# Patient Record
Sex: Male | Born: 1940 | Race: White | Hispanic: No | Marital: Married | Smoking: Former smoker
Health system: Southern US, Community
[De-identification: ages and names within clinical notes are randomized; demographics above are authoritative.]

## PROBLEM LIST (undated history)

## (undated) DIAGNOSIS — I10 Essential (primary) hypertension: Secondary | ICD-10-CM

## (undated) DIAGNOSIS — I4891 Unspecified atrial fibrillation: Secondary | ICD-10-CM

## (undated) DIAGNOSIS — E785 Hyperlipidemia, unspecified: Secondary | ICD-10-CM

## (undated) DIAGNOSIS — I2781 Cor pulmonale (chronic): Secondary | ICD-10-CM

## (undated) DIAGNOSIS — J449 Chronic obstructive pulmonary disease, unspecified: Secondary | ICD-10-CM

## (undated) DIAGNOSIS — E119 Type 2 diabetes mellitus without complications: Secondary | ICD-10-CM

## (undated) DIAGNOSIS — J9612 Chronic respiratory failure with hypercapnia: Secondary | ICD-10-CM

## (undated) DIAGNOSIS — I5032 Chronic diastolic (congestive) heart failure: Secondary | ICD-10-CM

## (undated) DIAGNOSIS — J9611 Chronic respiratory failure with hypoxia: Secondary | ICD-10-CM

## (undated) DIAGNOSIS — N189 Chronic kidney disease, unspecified: Secondary | ICD-10-CM

## (undated) DIAGNOSIS — K219 Gastro-esophageal reflux disease without esophagitis: Secondary | ICD-10-CM

## (undated) HISTORY — PX: COLON SURGERY: SHX602

## (undated) HISTORY — PX: ROTATOR CUFF REPAIR: SHX139

---

## 2014-03-11 ENCOUNTER — Emergency Department (HOSPITAL_COMMUNITY)
Admission: EM | Admit: 2014-03-11 | Discharge: 2014-03-11 | Disposition: A | Discharge status: Home / Self Care | Payer: Medicare HMO | Attending: Emergency Medicine | Admitting: Emergency Medicine

## 2014-03-11 ENCOUNTER — Encounter (HOSPITAL_COMMUNITY): Payer: Self-pay | Admitting: Emergency Medicine

## 2014-03-11 DIAGNOSIS — I10 Essential (primary) hypertension: Secondary | ICD-10-CM | POA: Diagnosis not present

## 2014-03-11 DIAGNOSIS — Z8719 Personal history of other diseases of the digestive system: Secondary | ICD-10-CM | POA: Diagnosis not present

## 2014-03-11 DIAGNOSIS — Z8639 Personal history of other endocrine, nutritional and metabolic disease: Secondary | ICD-10-CM | POA: Insufficient documentation

## 2014-03-11 DIAGNOSIS — M436 Torticollis: Secondary | ICD-10-CM

## 2014-03-11 DIAGNOSIS — Z87891 Personal history of nicotine dependence: Secondary | ICD-10-CM | POA: Diagnosis not present

## 2014-03-11 DIAGNOSIS — M542 Cervicalgia: Secondary | ICD-10-CM | POA: Diagnosis not present

## 2014-03-11 DIAGNOSIS — Z862 Personal history of diseases of the blood and blood-forming organs and certain disorders involving the immune mechanism: Secondary | ICD-10-CM | POA: Insufficient documentation

## 2014-03-11 DIAGNOSIS — E119 Type 2 diabetes mellitus without complications: Secondary | ICD-10-CM | POA: Diagnosis not present

## 2014-03-11 HISTORY — DX: Hyperlipidemia, unspecified: E78.5

## 2014-03-11 MED ORDER — DIAZEPAM 5 MG PO TABS
5.0000 mg | ORAL_TABLET | Freq: Once | ORAL | Status: AC
  Administered 2014-03-11: 5 mg via ORAL
  Filled 2014-03-11: qty 1

## 2014-03-11 MED ORDER — MORPHINE SULFATE 4 MG/ML IJ SOLN
6.0000 mg | Freq: Once | INTRAMUSCULAR | Status: AC
  Administered 2014-03-11: 6 mg via INTRAMUSCULAR
  Filled 2014-03-11: qty 2

## 2014-03-11 MED ORDER — DIAZEPAM 5 MG PO TABS: 5.0000 mg | ORAL_TABLET | Freq: Four times a day (QID) | ORAL | Status: DC | PRN

## 2014-03-11 MED ORDER — HYDROCODONE-ACETAMINOPHEN 5-325 MG PO TABS: 1.0000 | ORAL_TABLET | ORAL | Status: DC | PRN

## 2014-03-11 NOTE — ED Notes (Signed)
Patient c/o neck pain and stiffness that started three days ago and gradually gotten worse. Denies injury or heavy lifting. States pain started on right trapezius muscle and has moved up neck and towards left side as well. Ambulatory with steady gait.

## 2014-03-11 NOTE — ED Provider Notes (Signed)
CSN: [id number]     Arrival date & time 03/11/14  9147 History  This chart was scribed for Enid Skeens, MD by Gwenyth Ober, ED Scribe. This patient was seen in room APA15/APA15 and the patient's care was started at 10:35 AM.    Chief Complaint  Patient presents with  . Torticollis   The history is provided by the patient and the spouse. No language interpreter was used.   HPI Comments: Emmerich Cryer is a 73 y.o. male who presents to the Emergency Department complaining of worsening right-sided neck pain and stiffness that started 3 days ago and travelled this morning to whole neck. Pt states that he changed his pillow with no relief to his symptoms. Pt  has limited ROM in neck and cannot look up, down or side-side. Pt has no Hx of prior symptoms. Pt denies changes in BM and urination, fever, constipation, CP, abdominal pain, numbness or weaknesses in his arm and legs, HA, and problems urinating. Pt has no past neck surgery. Pt has history of DM, HLD, HTN. Pt has NKDA.  Past Medical History  Diagnosis Date  . Hypertension   . Diabetes mellitus without complication   . Hyperlipidemia   . Acid reflux    Past Surgical History  Procedure Laterality Date  . Rotator cuff repair    . Colon surgery     No family history on file. History  Substance Use Topics  . Smoking status: Former Games developer  . Smokeless tobacco: Not on file  . Alcohol Use: No    Review of Systems  Constitutional: Negative for fever.  Respiratory: Negative for shortness of breath.   Cardiovascular: Negative for chest pain.  Gastrointestinal: Negative for abdominal pain and constipation.  Genitourinary: Negative for difficulty urinating.  Musculoskeletal: Positive for neck pain and neck stiffness.  Neurological: Negative for weakness, numbness and headaches.  All other systems reviewed and are negative.     Allergies  Review of patient's allergies indicates no known allergies.  Home Medications   Prior to  Admission medications   Not on File   BP 149/66  Pulse 59  Temp(Src) 97.9 F (36.6 C) (Oral)  Resp 16  Ht  (1.753 m)  Wt 200 lb (90.719 kg)  BMI 29.52 kg/m2  SpO2 100% Physical Exam  Constitutional: He appears well-developed and well-nourished. No distress.  HENT:  Head: Normocephalic and atraumatic.  Eyes: Conjunctivae and EOM are normal. Pupils are equal, round, and reactive to light.  Gross visual fields intact  Neck: Neck supple. No tracheal deviation present.  Cardiovascular: Normal rate.   Pulmonary/Chest: Effort normal. No respiratory distress.  Musculoskeletal:  Paraspinal cervical muscles are tight, tender (worse on left) No sign of midline tenderness (pain in muscles) Pain with both horizontal ROM Decreased ROM on both sides    Neurological: No cranial nerve deficit.  Sensation intact in major muscle groups 5+ strength of major muscle groups of UE Good strength of LE Wrist extension, flexion Finger extension, flexion    Skin: Skin is warm and dry.  Psychiatric: He has a normal mood and affect. His behavior is normal.    ED Course  Procedures (including critical care time) DIAGNOSTIC STUDIES: Oxygen Saturation is 100% on RA, normal by my interpretation.    COORDINATION OF CARE: 10:40 AM- Will order pain medication and muscle relaxers. Advised to use heating pad for symptoms and to follow up in 48 hours. Pt agreed to plan.   Labs Review Labs Reviewed - No  data to display  Imaging Review No results found.   EKG Interpretation None      MDM   Final diagnoses:  Torticollis  Neck pain   I personally performed the services described in this documentation, which was scribed in my presence. The recorded information has been reviewed and is accurate.  Clinically musculoskeletal/torticollis. No signs of meningitis. Normal neuro exam. Patient improved in ER with medicines.  Results and differential diagnosis were discussed with the  patient/parent/guardian. Close follow up outpatient was discussed, comfortable with the plan.   Medications  diazepam (VALIUM) tablet 5 mg (5 mg Oral Given 03/11/14 1114)  morphine 4 MG/ML injection 6 mg (6 mg Intramuscular Given 03/11/14 1114)    Filed Vitals:   03/11/14 1018 03/11/14 1130  BP: 149/66 133/64  Pulse: 59 65  Temp: 97.9 F (36.6 C)   TempSrc: Oral   Resp: 16 16  Height:  (1.753 m)   Weight: 200 lb (90.719 kg)   SpO2: 100% 100%    Final diagnoses:  Torticollis  Neck pain       Enid Skeens, MD 03/16/14 [phone]

## 2014-03-11 NOTE — Discharge Instructions (Signed)
If you were given medicines take as directed.  If you are on coumadin or contraceptives realize their levels and effectiveness is altered by many different medicines.  If you have any reaction (rash, tongues swelling, other) to the medicines stop taking and see a physician.   Use heat and gradually increase range of motion.  Please follow up as directed and return to the ER or see a physician for new or worsening symptoms.  Thank you. Filed Vitals:   03/11/14 1018  BP: 149/66  Pulse: 59  Temp: 97.9 F (36.6 C)  TempSrc: Oral  Resp: 16  Height:  (1.753 m)  Weight: 200 lb (90.719 kg)  SpO2: 100%    Cervical Sprain A cervical sprain is an injury in the neck in which the strong, fibrous tissues (ligaments) that connect your neck bones stretch or tear. Cervical sprains can range from mild to severe. Severe cervical sprains can cause the neck vertebrae to be unstable. This can lead to damage of the spinal cord and can result in serious nervous system problems. The amount of time it takes for a cervical sprain to get better depends on the cause and extent of the injury. Most cervical sprains heal in 1 to 3 weeks. CAUSES  Severe cervical sprains may be caused by:   Contact sport injuries (such as from football, rugby, wrestling, hockey, auto racing, gymnastics, diving, martial arts, or boxing).   Motor vehicle collisions.   Whiplash injuries. This is an injury from a sudden forward and backward whipping movement of the head and neck.  Falls.  Mild cervical sprains may be caused by:   Being in an awkward position, such as while cradling a telephone between your ear and shoulder.   Sitting in a chair that does not offer proper support.   Working at a poorly Marketing executive station.   Looking up or down for long periods of time.  SYMPTOMS   Pain, soreness, stiffness, or a burning sensation in the front, back, or sides of the neck. This discomfort may develop immediately  after the injury or slowly, 24 hours or more after the injury.   Pain or tenderness directly in the middle of the back of the neck.   Shoulder or upper back pain.   Limited ability to move the neck.   Headache.   Dizziness.   Weakness, numbness, or tingling in the hands or arms.   Muscle spasms.   Difficulty swallowing or chewing.   Tenderness and swelling of the neck.  DIAGNOSIS  Most of the time your health care provider can diagnose a cervical sprain by taking your history and doing a physical exam. Your health care provider will ask about previous neck injuries and any known neck problems, such as arthritis in the neck. X-rays may be taken to find out if there are any other problems, such as with the bones of the neck. Other tests, such as a CT scan or MRI, may also be needed.  TREATMENT  Treatment depends on the severity of the cervical sprain. Mild sprains can be treated with rest, keeping the neck in place (immobilization), and pain medicines. Severe cervical sprains are immediately immobilized. Further treatment is done to help with pain, muscle spasms, and other symptoms and may include:  Medicines, such as pain relievers, numbing medicines, or muscle relaxants.   Physical therapy. This may involve stretching exercises, strengthening exercises, and posture training. Exercises and improved posture can help stabilize the neck, strengthen muscles, and help  stop symptoms from returning.  HOME CARE INSTRUCTIONS   Put ice on the injured area.   Put ice in a plastic bag.   Place a towel between your skin and the bag.   Leave the ice on for 15-20 minutes, 3-4 times a day.   If your injury was severe, you may have been given a cervical collar to wear. A cervical collar is a two-piece collar designed to keep your neck from moving while it heals.  Do not remove the collar unless instructed by your health care provider.  If you have long hair, keep it outside of  the collar.  Ask your health care provider before making any adjustments to your collar. Minor adjustments may be required over time to improve comfort and reduce pressure on your chin or on the back of your head.  Ifyou are allowed to remove the collar for cleaning or bathing, follow your health care provider's instructions on how to do so safely.  Keep your collar clean by wiping it with mild soap and water and drying it completely. If the collar you have been given includes removable pads, remove them every 1-2 days and hand wash them with soap and water. Allow them to air dry. They should be completely dry before you wear them in the collar.  If you are allowed to remove the collar for cleaning and bathing, wash and dry the skin of your neck. Check your skin for irritation or sores. If you see any, tell your health care provider.  Do not drive while wearing the collar.   Only take over-the-counter or prescription medicines for pain, discomfort, or fever as directed by your health care provider.   Keep all follow-up appointments as directed by your health care provider.   Keep all physical therapy appointments as directed by your health care provider.   Make any needed adjustments to your workstation to promote good posture.   Avoid positions and activities that make your symptoms worse.   Warm up and stretch before being active to help prevent problems.  SEEK MEDICAL CARE IF:   Your pain is not controlled with medicine.   You are unable to decrease your pain medicine over time as planned.   Your activity level is not improving as expected.  SEEK IMMEDIATE MEDICAL CARE IF:   You develop any bleeding.  You develop stomach upset.  You have signs of an allergic reaction to your medicine.   Your symptoms get worse.   You develop new, unexplained symptoms.   You have numbness, tingling, weakness, or paralysis in any part of your body.  MAKE SURE YOU:    Understand these instructions.  Will watch your condition.  Will get help right away if you are not doing well or get worse. Document Released: 04/05/2007 Document Revised: 06/13/2013 Document Reviewed: 12/14/2012 [facility] Patient Information 2015 [place], [place]. This information is not intended to replace advice given to you by your health care provider. Make sure you discuss any questions you have with your health care provider.

## 2018-02-06 ENCOUNTER — Other Ambulatory Visit: Payer: Self-pay

## 2018-02-06 ENCOUNTER — Inpatient Hospital Stay (HOSPITAL_COMMUNITY): Payer: Medicare HMO

## 2018-02-06 ENCOUNTER — Emergency Department (HOSPITAL_COMMUNITY): Payer: Medicare HMO

## 2018-02-06 ENCOUNTER — Encounter (HOSPITAL_COMMUNITY): Payer: Self-pay | Admitting: *Deleted

## 2018-02-06 ENCOUNTER — Inpatient Hospital Stay (HOSPITAL_COMMUNITY)
Admission: EM | Admit: 2018-02-06 | Discharge: 2018-02-08 | DRG: 189 | Disposition: A | Discharge status: Home / Self Care | Payer: Medicare HMO | Attending: Internal Medicine | Admitting: Internal Medicine

## 2018-02-06 DIAGNOSIS — N182 Chronic kidney disease, stage 2 (mild): Secondary | ICD-10-CM | POA: Diagnosis present

## 2018-02-06 DIAGNOSIS — E785 Hyperlipidemia, unspecified: Secondary | ICD-10-CM | POA: Diagnosis present

## 2018-02-06 DIAGNOSIS — I2781 Cor pulmonale (chronic): Secondary | ICD-10-CM | POA: Diagnosis present

## 2018-02-06 DIAGNOSIS — Z7984 Long term (current) use of oral hypoglycemic drugs: Secondary | ICD-10-CM

## 2018-02-06 DIAGNOSIS — Z87891 Personal history of nicotine dependence: Secondary | ICD-10-CM | POA: Diagnosis not present

## 2018-02-06 DIAGNOSIS — I340 Nonrheumatic mitral (valve) insufficiency: Secondary | ICD-10-CM | POA: Diagnosis not present

## 2018-02-06 DIAGNOSIS — R0602 Shortness of breath: Secondary | ICD-10-CM | POA: Diagnosis present

## 2018-02-06 DIAGNOSIS — R0989 Other specified symptoms and signs involving the circulatory and respiratory systems: Secondary | ICD-10-CM

## 2018-02-06 DIAGNOSIS — N183 Chronic kidney disease, stage 3 unspecified: Secondary | ICD-10-CM

## 2018-02-06 DIAGNOSIS — R590 Localized enlarged lymph nodes: Secondary | ICD-10-CM | POA: Diagnosis present

## 2018-02-06 DIAGNOSIS — J9622 Acute and chronic respiratory failure with hypercapnia: Secondary | ICD-10-CM | POA: Diagnosis not present

## 2018-02-06 DIAGNOSIS — I130 Hypertensive heart and chronic kidney disease with heart failure and stage 1 through stage 4 chronic kidney disease, or unspecified chronic kidney disease: Secondary | ICD-10-CM | POA: Diagnosis present

## 2018-02-06 DIAGNOSIS — J849 Interstitial pulmonary disease, unspecified: Secondary | ICD-10-CM | POA: Diagnosis present

## 2018-02-06 DIAGNOSIS — J441 Chronic obstructive pulmonary disease with (acute) exacerbation: Secondary | ICD-10-CM | POA: Diagnosis not present

## 2018-02-06 DIAGNOSIS — Z7982 Long term (current) use of aspirin: Secondary | ICD-10-CM | POA: Diagnosis not present

## 2018-02-06 DIAGNOSIS — I5031 Acute diastolic (congestive) heart failure: Secondary | ICD-10-CM | POA: Diagnosis present

## 2018-02-06 DIAGNOSIS — Z79899 Other long term (current) drug therapy: Secondary | ICD-10-CM

## 2018-02-06 DIAGNOSIS — J9621 Acute and chronic respiratory failure with hypoxia: Principal | ICD-10-CM | POA: Diagnosis present

## 2018-02-06 DIAGNOSIS — Z9981 Dependence on supplemental oxygen: Secondary | ICD-10-CM | POA: Diagnosis not present

## 2018-02-06 DIAGNOSIS — K219 Gastro-esophageal reflux disease without esophagitis: Secondary | ICD-10-CM | POA: Diagnosis present

## 2018-02-06 DIAGNOSIS — I10 Essential (primary) hypertension: Secondary | ICD-10-CM

## 2018-02-06 DIAGNOSIS — I509 Heart failure, unspecified: Secondary | ICD-10-CM | POA: Diagnosis not present

## 2018-02-06 DIAGNOSIS — E1122 Type 2 diabetes mellitus with diabetic chronic kidney disease: Secondary | ICD-10-CM | POA: Diagnosis present

## 2018-02-06 DIAGNOSIS — I4510 Unspecified right bundle-branch block: Secondary | ICD-10-CM | POA: Diagnosis present

## 2018-02-06 LAB — COMPREHENSIVE METABOLIC PANEL
ALT: 12 U/L (ref 0–44)
AST: 16 U/L (ref 15–41)
Albumin: 3.9 g/dL (ref 3.5–5.0)
Alkaline Phosphatase: 66 U/L (ref 38–126)
Anion gap: 9 (ref 5–15)
BUN: 25 mg/dL — ABNORMAL HIGH (ref 8–23)
CHLORIDE: 100 mmol/L (ref 98–111)
CO2: 29 mmol/L (ref 22–32)
Calcium: 9.7 mg/dL (ref 8.9–10.3)
Creatinine, Ser: 1.32 mg/dL — ABNORMAL HIGH (ref 0.61–1.24)
GFR, EST AFRICAN AMERICAN: 58 mL/min — AB (ref >60)
GFR, EST NON AFRICAN AMERICAN: 50 mL/min — AB (ref >60)
Glucose, Bld: 180 mg/dL — ABNORMAL HIGH (ref 70–99)
POTASSIUM: 5 mmol/L (ref 3.5–5.1)
Sodium: 138 mmol/L (ref 135–145)
Total Bilirubin: 1.1 mg/dL (ref 0.3–1.2)
Total Protein: 8.5 g/dL — ABNORMAL HIGH (ref 6.5–8.1)

## 2018-02-06 LAB — BRAIN NATRIURETIC PEPTIDE: B Natriuretic Peptide: 329 pg/mL — ABNORMAL HIGH (ref 0.0–100.0)

## 2018-02-06 LAB — URINALYSIS, ROUTINE W REFLEX MICROSCOPIC
BILIRUBIN URINE: NEGATIVE
Glucose, UA: NEGATIVE mg/dL
Hgb urine dipstick: NEGATIVE
Ketones, ur: NEGATIVE mg/dL
Leukocytes, UA: NEGATIVE
NITRITE: NEGATIVE
PROTEIN: NEGATIVE mg/dL
SPECIFIC GRAVITY, URINE: 1.006 (ref 1.005–1.030)
pH: 7 (ref 5.0–8.0)

## 2018-02-06 LAB — TROPONIN I

## 2018-02-06 LAB — HEMOGLOBIN A1C
Hgb A1c MFr Bld: 6.6 % — ABNORMAL HIGH (ref 4.8–5.6)
MEAN PLASMA GLUCOSE: 142.72 mg/dL

## 2018-02-06 LAB — BLOOD GAS, ARTERIAL
ACID-BASE EXCESS: 1.7 mmol/L (ref 0.0–2.0)
Bicarbonate: 25.4 mmol/L (ref 20.0–28.0)
DRAWN BY: 419771
O2 CONTENT: 4 L/min
O2 Saturation: 98.8 %
PH ART: 7.346 — AB (ref 7.350–7.450)
pCO2 arterial: 50.3 mmHg — ABNORMAL HIGH (ref 32.0–48.0)
pO2, Arterial: 143 mmHg — ABNORMAL HIGH (ref 83.0–108.0)

## 2018-02-06 LAB — CBC WITH DIFFERENTIAL/PLATELET
BASOS ABS: 0 10*3/uL (ref 0.0–0.1)
Basophils Relative: 0 %
Eosinophils Absolute: 0.6 10*3/uL (ref 0.0–0.7)
Eosinophils Relative: 8 %
HCT: 38.3 % — ABNORMAL LOW (ref 39.0–52.0)
Hemoglobin: 11.6 g/dL — ABNORMAL LOW (ref 13.0–17.0)
LYMPHS ABS: 1.3 10*3/uL (ref 0.7–4.0)
LYMPHS PCT: 16 %
MCH: 28 pg (ref 26.0–34.0)
MCHC: 30.3 g/dL (ref 30.0–36.0)
MCV: 92.3 fL (ref 78.0–100.0)
Monocytes Absolute: 0.5 10*3/uL (ref 0.1–1.0)
Monocytes Relative: 7 %
NEUTROS PCT: 69 %
Neutro Abs: 5.5 10*3/uL (ref 1.7–7.7)
Platelets: 227 10*3/uL (ref 150–400)
RBC: 4.15 MIL/uL — AB (ref 4.22–5.81)
RDW: 14.9 % (ref 11.5–15.5)
WBC: 7.9 10*3/uL (ref 4.0–10.5)

## 2018-02-06 LAB — PROCALCITONIN: Procalcitonin: <0.1 ng/mL

## 2018-02-06 LAB — GLUCOSE, CAPILLARY
GLUCOSE-CAPILLARY: 300 mg/dL — AB (ref 70–99)
Glucose-Capillary: 151 mg/dL — ABNORMAL HIGH (ref 70–99)

## 2018-02-06 MED ORDER — SODIUM CHLORIDE 0.9% FLUSH: 3.0000 mL | INTRAVENOUS | Status: DC | PRN

## 2018-02-06 MED ORDER — IPRATROPIUM-ALBUTEROL 0.5-2.5 (3) MG/3ML IN SOLN: 3.0000 mL | Freq: Four times a day (QID) | RESPIRATORY_TRACT | Status: DC

## 2018-02-06 MED ORDER — IPRATROPIUM-ALBUTEROL 0.5-2.5 (3) MG/3ML IN SOLN
3.0000 mL | Freq: Four times a day (QID) | RESPIRATORY_TRACT | Status: DC
Start: 2018-02-06 — End: 2018-02-08
  Administered 2018-02-06 – 2018-02-08 (×5): 3 mL via RESPIRATORY_TRACT
  Filled 2018-02-06 (×6): qty 3

## 2018-02-06 MED ORDER — ENOXAPARIN SODIUM 40 MG/0.4ML ~~LOC~~ SOLN
40.0000 mg | SUBCUTANEOUS | Status: DC
  Administered 2018-02-06 – 2018-02-07 (×2): 40 mg via SUBCUTANEOUS
  Filled 2018-02-06 (×2): qty 0.4

## 2018-02-06 MED ORDER — SODIUM CHLORIDE 0.9 % IV SOLN: 250.0000 mL | INTRAVENOUS | Status: DC | PRN

## 2018-02-06 MED ORDER — PANTOPRAZOLE SODIUM 40 MG PO TBEC
40.0000 mg | DELAYED_RELEASE_TABLET | Freq: Every day | ORAL | Status: DC
  Administered 2018-02-07 – 2018-02-08 (×2): 40 mg via ORAL
  Filled 2018-02-06 (×2): qty 1

## 2018-02-06 MED ORDER — DOCUSATE SODIUM 100 MG PO CAPS
100.0000 mg | ORAL_CAPSULE | Freq: Every day | ORAL | Status: DC | PRN
  Administered 2018-02-06 – 2018-02-07 (×2): 100 mg via ORAL
  Filled 2018-02-06 (×2): qty 1

## 2018-02-06 MED ORDER — ONDANSETRON HCL 4 MG/2ML IJ SOLN: 4.0000 mg | Freq: Four times a day (QID) | INTRAMUSCULAR | Status: DC | PRN

## 2018-02-06 MED ORDER — ATORVASTATIN CALCIUM 20 MG PO TABS: 20.0000 mg | ORAL_TABLET | Freq: Every day | ORAL | Status: DC

## 2018-02-06 MED ORDER — FUROSEMIDE 10 MG/ML IJ SOLN: 40.0000 mg | Freq: Every day | INTRAMUSCULAR | Status: DC

## 2018-02-06 MED ORDER — FUROSEMIDE 10 MG/ML IJ SOLN
40.0000 mg | Freq: Once | INTRAMUSCULAR | Status: AC
  Administered 2018-02-06: 40 mg via INTRAVENOUS
  Filled 2018-02-06: qty 4

## 2018-02-06 MED ORDER — METHYLPREDNISOLONE SODIUM SUCC 125 MG IJ SOLR
60.0000 mg | Freq: Four times a day (QID) | INTRAMUSCULAR | Status: DC
  Administered 2018-02-06 – 2018-02-07 (×2): 60 mg via INTRAVENOUS
  Filled 2018-02-06 (×2): qty 2

## 2018-02-06 MED ORDER — INSULIN ASPART 100 UNIT/ML ~~LOC~~ SOLN
0.0000 [IU] | Freq: Three times a day (TID) | SUBCUTANEOUS | Status: DC
  Administered 2018-02-06: 2 [IU] via SUBCUTANEOUS
  Administered 2018-02-07: 9 [IU] via SUBCUTANEOUS
  Administered 2018-02-07: 3 [IU] via SUBCUTANEOUS

## 2018-02-06 MED ORDER — BUDESONIDE 0.5 MG/2ML IN SUSP
0.5000 mg | Freq: Two times a day (BID) | RESPIRATORY_TRACT | Status: DC
  Administered 2018-02-06 – 2018-02-08 (×4): 0.5 mg via RESPIRATORY_TRACT
  Filled 2018-02-06 (×4): qty 2

## 2018-02-06 MED ORDER — ASPIRIN EC 81 MG PO TBEC
81.0000 mg | DELAYED_RELEASE_TABLET | Freq: Every day | ORAL | Status: DC
  Administered 2018-02-07 – 2018-02-08 (×2): 81 mg via ORAL
  Filled 2018-02-06 (×2): qty 1

## 2018-02-06 MED ORDER — METHYLPREDNISOLONE SODIUM SUCC 125 MG IJ SOLR
125.0000 mg | Freq: Once | INTRAMUSCULAR | Status: AC
  Administered 2018-02-06: 125 mg via INTRAVENOUS
  Filled 2018-02-06: qty 2

## 2018-02-06 MED ORDER — INSULIN ASPART 100 UNIT/ML ~~LOC~~ SOLN
0.0000 [IU] | Freq: Every day | SUBCUTANEOUS | Status: DC
  Administered 2018-02-06: 3 [IU] via SUBCUTANEOUS

## 2018-02-06 MED ORDER — ALBUTEROL (5 MG/ML) CONTINUOUS INHALATION SOLN
10.0000 mg/h | INHALATION_SOLUTION | Freq: Once | RESPIRATORY_TRACT | Status: AC
  Administered 2018-02-06: 10 mg/h via RESPIRATORY_TRACT
  Filled 2018-02-06: qty 20

## 2018-02-06 MED ORDER — ACETAMINOPHEN 325 MG PO TABS: 650.0000 mg | ORAL_TABLET | ORAL | Status: DC | PRN

## 2018-02-06 MED ORDER — CARVEDILOL 12.5 MG PO TABS
12.5000 mg | ORAL_TABLET | Freq: Two times a day (BID) | ORAL | Status: DC
  Administered 2018-02-06: 12.5 mg via ORAL
  Filled 2018-02-06: qty 1

## 2018-02-06 MED ORDER — ZOLPIDEM TARTRATE 5 MG PO TABS
5.0000 mg | ORAL_TABLET | Freq: Once | ORAL | Status: AC
  Administered 2018-02-06: 5 mg via ORAL
  Filled 2018-02-06: qty 1

## 2018-02-06 MED ORDER — SODIUM CHLORIDE 0.9% FLUSH
3.0000 mL | Freq: Two times a day (BID) | INTRAVENOUS | Status: DC
  Administered 2018-02-06 – 2018-02-08 (×5): 3 mL via INTRAVENOUS

## 2018-02-06 NOTE — H&P (Signed)
History and Physical  Mario LovingsJames R Sangiovanni ZOX:[id number] DOB: 04/04/1941 DOA: 02/06/2018   PCP: [name], [name], MD   Patient coming from: Home  Chief Complaint: dyspnea  HPI:  Mario Walker is a 77 y.o. male with medical history of tobacco abuse, diabetes mellitus, hypertension, and hyperlipidemia presenting with 2 to 5477-month history of worsening shortness of breath that has significantly worsened over the past 1 week.  Patient complains primarily of dyspnea on exertion, but he denies any fevers, chills, chest pain, nausea, vomiting, diarrhea, abdominal pain, coughing, hemoptysis.  He states that he is having shortness of breath even walking to his bathroom from his bedroom.  The patient has noted some increase in lower extremity edema, but states that it has been unchanged in the past 1 to 2 weeks.  The patient was placed on furosemide approximately 2 months prior to this admission.  [facility] approximately 1 month prior to this admission for CHF.  He was discharged home with furosemide 40 mg daily.  Patient denies any orthopnea or PND type symptoms.  However he does complain of fluctuating lower extremity edema and some mild increase in his abdominal girth.  He denies any recent travels or sick contacts.  The patient has a remote history of tobacco with a total of approximately 30 pack years.  He quit smoking 50 years ago.  He denies any abdominal pain, dysuria, hematuria, rashes, synovitis.  The patient used to work in the Education officer, environmentalauto industry retreading brakes.  Patient states that he normally he is on 2 L nasal cannula at home, but in the past 1 to 2 weeks he has had increase it to 4 L. In the emergency department, the patient desaturated to 69% on his usual 4 L.  The patient is afebrile hemodynamically stable saturating 96-100% on 4 L.  CBC was essentially unremarkable.  BMP showed serum creatinine 1.32.  LFTs were unremarkable.  BNP was 329.  Initial troponin was  negative.  Chest x-ray showed mild bilateral perihilar opacification suggesting a vascular congestion.  ABG revealed 7.34/50/143/25 on 4 L.  The patient was given Solu-Medrol and furosemide in the emergency department with some improvement in his respiratory status.  Assessment/Plan: Acute on chronic respiratory failure -Patient is normally on 2 L baseline -Etiology unclear, but suspect this is multifactorial including CHF, underlying COPD and possible underlying interstitial lung disease -Wean oxygen as tolerated back to 2 L for saturation greater 92% -Pulmonary hygiene -check ANA -check ANCA  COPD exacerbation -Start Pulmicort -Start IV Solu-Medrol -Start duo nebs -Will ultimately need PFTs as an outpatient  Acute CHF, type unspecified -start Lasix 40 mg IV daily -Echocardiogram -Daily weights -Accurate I's and O's  Diabetes mellitus type 2 -NovoLog sliding scale -Hemoglobin A1c -Holding glimepiride and metformin  Essential hypertension -Continue carvedilol -Holding Cardura and HCTZ  CKD stage III -Baseline creatinine 0.9-1.2 -Presenting creatinine 1.32 -Monitor with diuresis  RBBB -no old EKGs to compare -Echo     Past Medical History:  Diagnosis Date  . Acid reflux   . Diabetes mellitus without complication (HCC)   . Hyperlipidemia   . Hypertension    Past Surgical History:  Procedure Laterality Date  . COLON SURGERY    . ROTATOR CUFF REPAIR     Social History:  reports that he has quit smoking. He has never used smokeless tobacco. He reports that he does not drink alcohol or use drugs.   Family history--reviewed. No pertinent family  hx  No Known Allergies   Prior to Admission medications   Medication Sig Start Date End Date Taking? Authorizing Provider  amLODipine (NORVASC) 10 MG tablet Take 1 tablet by mouth daily. 03/05/14  Yes [provider]  Ascorbic Acid (VITAMIN C PO) Take 1 tablet by mouth daily.   Yes [provider]    aspirin EC 81 MG tablet Take 81 mg by mouth daily.   Yes [provider]  atorvastatin (LIPITOR) 20 MG tablet Take 1 tablet by mouth daily. 01/03/14  Yes [provider]  carvedilol (COREG) 25 MG tablet Take 1 tablet by mouth 2 (two) times daily. 01/19/14  Yes [provider]  Cholecalciferol (VITAMIN D PO) Take 1 tablet by mouth daily.   Yes [provider]  Cyanocobalamin (VITAMIN B-12 PO) Take 1 tablet by mouth daily.   Yes [provider]  doxazosin (CARDURA) 8 MG tablet Take 1 tablet by mouth daily. 01/19/14  Yes [provider]  glimepiride (AMARYL) 2 MG tablet Take 1 tablet by mouth daily. 03/05/14  Yes [provider]  hydrochlorothiazide (HYDRODIURIL) 25 MG tablet Take 1 tablet by mouth daily. 03/05/14  Yes [provider]  HYDROcodone-acetaminophen (NORCO) 5-325 MG per tablet Take 1-2 tablets by mouth every 4 (four) hours as needed. 03/11/14  Yes Blane Ohara, MD  metFORMIN (GLUCOPHAGE) 500 MG tablet Take 1 tablet by mouth daily. 02/09/14  Yes [provider]  multivitamin-iron-minerals-folic acid (CENTRUM) chewable tablet Chew 1 tablet by mouth daily.   Yes [provider]  Omega-3 Fatty Acids (FISH OIL PO) Take 1 capsule by mouth daily.   Yes [provider]  omeprazole (PRILOSEC) 20 MG capsule Take 1 capsule by mouth daily. 01/19/14  Yes [provider]  tetrahydrozoline (REDNESS RELIEVER EYE DROPS) 0.05 % ophthalmic solution Place 1 drop into both eyes daily.    [provider]  VITAMIN A PO Take 1 tablet by mouth daily.    [provider]    Review of Systems:  Constitutional:  No weight loss, night sweats, Fevers, chills, fatigue.  Head&Eyes: No headache.  No vision loss.  No eye pain or scotoma ENT:  No Difficulty swallowing,Tooth/dental problems,Sore throat,  No ear ache, post nasal drip,  Cardio-vascular:  No chest pain, Orthopnea, PND, swelling in  lower extremities,  dizziness, palpitations  GI:  No  abdominal pain, nausea, vomiting, diarrhea, loss of appetite, hematochezia, melena, heartburn, indigestion, Resp:   No cough. No coughing up of blood .No wheezing.No chest wall deformity  Skin:  no rash or lesions.  GU:  no dysuria, change in color of urine, no urgency or frequency. No flank pain.  Musculoskeletal:  No joint pain or swelling. No decreased range of motion. No back pain.  Psych:  No change in mood or affect. No depression or anxiety. Neurologic: No headache, no dysesthesia, no focal weakness, no vision loss. No syncope  Physical Exam: Vitals:   02/06/18 1200 02/06/18 1209 02/06/18 1230 02/06/18 1300  BP: (!) 155/72  (!) 145/73 (!) 149/73  Pulse: 73  66 66  Resp: 20  18 18   Temp:      TempSrc:      SpO2: 95% 98% 100% 100%  Weight:      Height:       General:  A&O x 3, NAD, nontoxic, pleasant/cooperative Head/Eye: No conjunctival hemorrhage, no icterus, [place]/AT, No nystagmus ENT:  No icterus,  No thrush, good dentition, no pharyngeal exudate Neck:  No masses,  no lymphadenpathy, no bruits CV:  RRR, no rub, no gallop, no S3 Lung: bibasilar rhonchi Abdomen: soft/NT, +BS, nondistended, no peritoneal signs Ext: No cyanosis, No rashes, No petechiae, No lymphangitis, 1 +LE edema Neuro: CNII-XII intact, strength 4/5 in bilateral upper and lower extremities, no dysmetria  Labs on Admission:  Basic Metabolic Panel: Recent Labs  Lab 02/06/18 1203  NA 138  K 5.0  CL 100  CO2 29  GLUCOSE 180*  BUN 25*  CREATININE 1.32*  CALCIUM 9.7   Liver Function Tests: Recent Labs  Lab 02/06/18 1203  AST 16  ALT 12  ALKPHOS 66  BILITOT 1.1  PROT 8.5*  ALBUMIN 3.9   No results for input(s): LIPASE, AMYLASE in the last 168 hours. No results for input(s): AMMONIA in the last 168 hours. CBC: Recent Labs  Lab 02/06/18 1203  WBC 7.9  NEUTROABS 5.5  HGB 11.6*  HCT 38.3*  MCV 92.3  PLT 227   Coagulation  Profile: No results for input(s): INR, PROTIME in the last 168 hours. Cardiac Enzymes: Recent Labs  Lab 02/06/18 1203  TROPONINI <0.03   BNP: Invalid input(s): POCBNP CBG: No results for input(s): GLUCAP in the last 168 hours. Urine analysis: No results found for: COLORURINE, APPEARANCEUR, LABSPEC, PHURINE, GLUCOSEU, HGBUR, BILIRUBINUR, KETONESUR, PROTEINUR, UROBILINOGEN, NITRITE, LEUKOCYTESUR Sepsis Labs: @LABRCNTIP (procalcitonin:4,lacticidven:4) ) Recent Results (from the past 240 hour(s))  Culture, blood (routine x 2)     Status: None (Preliminary result)   Collection Time: 02/06/18 12:35 PM  Result Value Ref Range Status   Specimen Description BLOOD LEFT HAND  Final   Special Requests   Final    BOTTLES DRAWN AEROBIC AND ANAEROBIC Blood Culture adequate volume Performed at [facility], [address] [place], [place] [address]    Culture PENDING  Incomplete   Report Status PENDING  Incomplete     Radiological Exams on Admission: Dg Chest Port 1 View  Result Date: 02/06/2018 CLINICAL DATA:  Shortness of breath worsening today. EXAM: PORTABLE CHEST 1 VIEW COMPARISON:  None. FINDINGS: Lungs are adequately inflated with mild hazy perihilar bibasilar opacification likely due to mild vascular congestion. Infection or atelectasis in the lung bases is possible. Moderate cardiomegaly. Degenerative change of the spine. IMPRESSION: Mild perihilar bibasilar opacification suggesting vascular congestion. Atelectasis or infection in the lung bases is possible. Moderate cardiomegaly. Electronically Signed   By: Elberta Fortisaniel  Boyle M.D.   On: 02/06/2018 13:07    EKG: Independently reviewed. Sinus RBBB    Time spent:60 minutes Code Status:   FULL Family Communication:  Son updated at bedside Disposition Plan: expect 2-3 day hospitalization Consults called: none DVT Prophylaxis: Post Lovenox  Catarina HartshornDavid Elayne Gruver, DO  [facility] Pager [phone]  If 7PM-7AM, please contact  night-coverage [email or web address] Password [email or web address] 02/06/2018, 2:10 PM

## 2018-02-06 NOTE — ED Triage Notes (Addendum)
Pt c/o worsening SOB x 2 months. Pt wears 2L of O2 via [place] normally and bumps it up to 4L when ambulating.  Pt reports he gets very worn out when ambulating. Denies cough, fever. Pt has been to see PCP and says he wasn't told anything except to follow up in 6 months.

## 2018-02-06 NOTE — ED Notes (Signed)
Call for report   Misty StanleyLisa, RN

## 2018-02-06 NOTE — ED Provider Notes (Signed)
[facility] EMERGENCY DEPARTMENT Provider Note   CSN: [id number] Arrival date & time: 02/06/18  1134     History   Chief Complaint Chief Complaint  Patient presents with  . Shortness of Breath    HPI Mario Walker is a 77 y.o. male.  Pt presents to the ED today with sob.  The pt is on oxygen at 2L normally.  He is unsure why, he has no formal diagnosis of COPD.  He said his doctor just told him he needed it.  For the past few days, he has been more sob.  He has increased his oxygen to 4L.  The pt gets very tired when he does ambulate.  He denies f/c.  No cough. No known dx of chf, but says he takes lasix daily (40 mg).  He did not take it yesterday, but did take it this morning.     Past Medical History:  Diagnosis Date  . Acid reflux   . Diabetes mellitus without complication (HCC)   . Hyperlipidemia   . Hypertension     There are no active problems to display for this patient.   Past Surgical History:  Procedure Laterality Date  . COLON SURGERY    . ROTATOR CUFF REPAIR          Home Medications    Prior to Admission medications   Medication Sig Start Date End Date Taking? Authorizing Provider  acetaminophen (TYLENOL) 500 MG tablet Take 1,000 mg by mouth daily as needed for moderate pain.    [provider]  amLODipine (NORVASC) 10 MG tablet Take 1 tablet by mouth daily. 03/05/14   [provider]  Ascorbic Acid (VITAMIN C PO) Take 1 tablet by mouth daily.    [provider]  aspirin EC 81 MG tablet Take 81 mg by mouth daily.    [provider]  atorvastatin (LIPITOR) 20 MG tablet Take 1 tablet by mouth daily. 01/03/14   [provider]  carvedilol (COREG) 25 MG tablet Take 1 tablet by mouth 2 (two) times daily. 01/19/14   [provider]  Cholecalciferol (VITAMIN D PO) Take 1 tablet by mouth daily.    [provider]  Cyanocobalamin (VITAMIN B-12 PO) Take 1 tablet by mouth daily.    [provider]  diazepam (VALIUM) 5 MG tablet Take 1 tablet (5 mg total) by mouth every 6 (six) hours as needed for anxiety (spasms). 03/11/14   Blane OharaZavitz, Joshua, MD  doxazosin (CARDURA) 8 MG tablet Take 1 tablet by mouth daily. 01/19/14   [provider]  glimepiride (AMARYL) 2 MG tablet Take 1 tablet by mouth daily. 03/05/14   [provider]  hydrochlorothiazide (HYDRODIURIL) 25 MG tablet Take 1 tablet by mouth daily. 03/05/14   [provider]  HYDROcodone-acetaminophen (NORCO) 5-325 MG per tablet Take 1-2 tablets by mouth every 4 (four) hours as needed. 03/11/14   Blane OharaZavitz, Joshua, MD  HYDROcodone-acetaminophen (NORCO/VICODIN) 5-325 MG per tablet Take 1 tablet by mouth daily as needed for moderate pain.  02/08/14   [provider]  metFORMIN (GLUCOPHAGE) 500 MG tablet Take 1 tablet by mouth daily. 02/09/14   [provider]  multivitamin-iron-minerals-folic acid (CENTRUM) chewable tablet Chew 1 tablet by mouth daily.    [provider]  Omega-3 Fatty Acids (FISH OIL PO) Take 1 capsule by mouth daily.    [provider]  omeprazole (PRILOSEC) 20 MG capsule Take 1 capsule by mouth daily. 01/19/14   [provider]  tetrahydrozoline (REDNESS RELIEVER EYE DROPS) 0.05 % ophthalmic solution Place 1 drop into both eyes daily.    [provider]  VITAMIN A PO Take 1 tablet by mouth daily.    [provider]    Family History No family history on file.  Social History Social History   Tobacco Use  . Smoking status: Former Games developermoker  . Smokeless tobacco: Never Used  Substance Use Topics  . Alcohol use: No  . Drug use: No     Allergies   Patient has no known allergies.   Review of Systems Review of Systems  Respiratory: Positive for shortness of breath.   All other systems reviewed and are negative.    Physical Exam Updated Vital Signs BP (!) 149/73   Pulse 66   Temp (!) 97.3 F (36.3 C) (Oral)   Resp  18   Ht 5\' 10"  (1.778 m)   Wt 90.7 kg   SpO2 100%   BMI 28.70 kg/m   Physical Exam  Constitutional: He is oriented to person, place, and time. He appears ill. He appears distressed.  HENT:  Head: Normocephalic and atraumatic.  Mouth/Throat: Oropharynx is clear and moist.  Eyes: Pupils are equal, round, and reactive to light. EOM are normal.  Neck: Normal range of motion. Neck supple.  Cardiovascular: Normal rate, regular rhythm, normal heart sounds and intact distal pulses.  Pulmonary/Chest: Accessory muscle usage present. Tachypnea noted. He is in respiratory distress. He has wheezes.  Abdominal: Soft. Bowel sounds are normal.  Musculoskeletal: Normal range of motion.       Right lower leg: Normal.       Left lower leg: Normal.  Neurological: He is alert and oriented to person, place, and time.  Skin: Skin is warm and dry. Capillary refill takes less than 2 seconds.  Psychiatric: He has a normal mood and affect. His behavior is normal.  Nursing note and vitals reviewed.    ED Treatments / Results  Labs (all labs ordered are listed, but only abnormal results are displayed) Labs Reviewed  COMPREHENSIVE METABOLIC PANEL - Abnormal; Notable for the following components:      Result Value   Glucose, Bld 180 (*)    BUN 25 (*)    Creatinine, Ser 1.32 (*)    Total Protein 8.5 (*)    GFR calc non Af Amer 50 (*)    GFR calc Af Amer 58 (*)    All other components within normal limits  CBC WITH DIFFERENTIAL/PLATELET - Abnormal; Notable for the following components:   RBC 4.15 (*)    Hemoglobin 11.6 (*)    HCT 38.3 (*)    All other components within normal limits  BRAIN NATRIURETIC PEPTIDE - Abnormal; Notable for the following components:   B Natriuretic Peptide 329.0 (*)    All other components within normal limits  BLOOD GAS, ARTERIAL - Abnormal; Notable for the following components:   pH, Arterial 7.346 (*)    pCO2 arterial 50.3 (*)    pO2, Arterial 143 (*)    All other  components within normal limits  CULTURE, BLOOD (ROUTINE X 2)  CULTURE, BLOOD (ROUTINE X 2)  TROPONIN I  URINALYSIS, ROUTINE W REFLEX MICROSCOPIC    EKG EKG Interpretation  Date/Time:  Sunday February 06 2018 12:00:48 EDT Ventricular Rate:  74 PR Interval:    QRS Duration: 152 QT Interval:  417 QTC Calculation: 463 R Axis:   -122 Text Interpretation:  Sinus rhythm Probable left atrial  enlargement Right bundle branch block Baseline wander in lead(s) V4 V5 No old tracing to compare Confirmed by Jacalyn Lefevre ([id number]) on 02/06/2018 12:44:56 PM   Radiology Dg Chest Port 1 View  Result Date: 02/06/2018 CLINICAL DATA:  Shortness of breath worsening today. EXAM: PORTABLE CHEST 1 VIEW COMPARISON:  None. FINDINGS: Lungs are adequately inflated with mild hazy perihilar bibasilar opacification likely due to mild vascular congestion. Infection or atelectasis in the lung bases is possible. Moderate cardiomegaly. Degenerative change of the spine. IMPRESSION: Mild perihilar bibasilar opacification suggesting vascular congestion. Atelectasis or infection in the lung bases is possible. Moderate cardiomegaly. Electronically Signed   By: Elberta Fortis M.D.   On: 02/06/2018 13:07    Procedures Procedures (including critical care time)  Medications Ordered in ED Medications  albuterol (PROVENTIL,VENTOLIN) solution continuous neb (10 mg/hr Nebulization Given 02/06/18 1209)  methylPREDNISolone sodium succinate (SOLU-MEDROL) 125 mg/2 mL injection 125 mg (125 mg Intravenous Given 02/06/18 1315)  furosemide (LASIX) injection 40 mg (40 mg Intravenous Given 02/06/18 1320)     Initial Impression / Assessment and Plan / ED Course  I have reviewed the triage vital signs and the nursing notes.  Pertinent labs & imaging results that were available during my care of the patient were reviewed by me and considered in my medical decision making (see chart for details).    On 2L, pt's O2 sat dropped to 69% just  moving from wheelchair to the bed.  His lips became cyanotic and he looked more distressed.  Turning oxygen up to 4L and having pt rest and take deep breaths through his nose brought oxygen back up.  Continuous neb has helped him feel better and he is now pinker.  The pt will also be given a dose of lasix here as well.  Resp status has improved and he looks more comfortable on 4L, but he still not back to baseline.  Pt d/w Dr. Arbutus Leas ([facility]) for admission.  CRITICAL CARE Performed by: Jacalyn Lefevre   Total critical care time: 30 minutes  Critical care time was exclusive of separately billable procedures and treating other patients.  Critical care was necessary to treat or prevent imminent or life-threatening deterioration.  Critical care was time spent personally by me on the following activities: development of treatment plan with patient and/or surrogate as well as nursing, discussions with consultants, evaluation of patient's response to treatment, examination of patient, obtaining history from patient or surrogate, ordering and performing treatments and interventions, ordering and review of laboratory studies, ordering and review of radiographic studies, pulse oximetry and re-evaluation of patient's condition.  Final Clinical Impressions(s) / ED Diagnoses   Final diagnoses:  Acute on chronic respiratory failure with hypoxia (HCC)  COPD exacerbation [facility])  Pulmonary vascular congestion    ED Discharge Orders    None       Jacalyn Lefevre, MD 02/06/18 1335

## 2018-02-07 ENCOUNTER — Inpatient Hospital Stay (HOSPITAL_COMMUNITY): Payer: Medicare HMO

## 2018-02-07 DIAGNOSIS — I340 Nonrheumatic mitral (valve) insufficiency: Secondary | ICD-10-CM

## 2018-02-07 LAB — GLUCOSE, CAPILLARY
GLUCOSE-CAPILLARY: 403 mg/dL — AB (ref 70–99)
GLUCOSE-CAPILLARY: 353 mg/dL — AB (ref 70–99)
Glucose-Capillary: 227 mg/dL — ABNORMAL HIGH (ref 70–99)
Glucose-Capillary: 342 mg/dL — ABNORMAL HIGH (ref 70–99)

## 2018-02-07 LAB — RESPIRATORY PANEL BY PCR
ADENOVIRUS-RVPPCR: NOT DETECTED
Bordetella pertussis: NOT DETECTED
CORONAVIRUS NL63-RVPPCR: NOT DETECTED
CORONAVIRUS OC43-RVPPCR: NOT DETECTED
Chlamydophila pneumoniae: NOT DETECTED
Coronavirus 229E: NOT DETECTED
Coronavirus HKU1: NOT DETECTED
INFLUENZA A-RVPPCR: NOT DETECTED
Influenza B: NOT DETECTED
METAPNEUMOVIRUS-RVPPCR: NOT DETECTED
Mycoplasma pneumoniae: NOT DETECTED
PARAINFLUENZA VIRUS 2-RVPPCR: NOT DETECTED
PARAINFLUENZA VIRUS 3-RVPPCR: NOT DETECTED
PARAINFLUENZA VIRUS 4-RVPPCR: NOT DETECTED
Parainfluenza Virus 1: NOT DETECTED
Respiratory Syncytial Virus: NOT DETECTED
Rhinovirus / Enterovirus: NOT DETECTED

## 2018-02-07 LAB — BASIC METABOLIC PANEL
ANION GAP: 10 (ref 5–15)
BUN: 27 mg/dL — ABNORMAL HIGH (ref 8–23)
CALCIUM: 9.4 mg/dL (ref 8.9–10.3)
CO2: 30 mmol/L (ref 22–32)
CREATININE: 1.16 mg/dL (ref 0.61–1.24)
Chloride: 96 mmol/L — ABNORMAL LOW (ref 98–111)
GFR calc Af Amer: >60 mL/min (ref >60)
GFR calc non Af Amer: 58 mL/min — ABNORMAL LOW (ref >60)
GLUCOSE: 226 mg/dL — AB (ref 70–99)
Potassium: 4.1 mmol/L (ref 3.5–5.1)
Sodium: 136 mmol/L (ref 135–145)

## 2018-02-07 LAB — ECHOCARDIOGRAM COMPLETE
Height: 70 in
Weight: 3188.73 oz

## 2018-02-07 MED ORDER — ATORVASTATIN CALCIUM 40 MG PO TABS
40.0000 mg | ORAL_TABLET | Freq: Every day | ORAL | Status: DC
  Administered 2018-02-07 – 2018-02-08 (×2): 40 mg via ORAL
  Filled 2018-02-07 (×2): qty 1

## 2018-02-07 MED ORDER — ZOLPIDEM TARTRATE 5 MG PO TABS
5.0000 mg | ORAL_TABLET | Freq: Every evening | ORAL | Status: DC | PRN
  Administered 2018-02-07: 5 mg via ORAL
  Filled 2018-02-07: qty 1

## 2018-02-07 MED ORDER — ORAL CARE MOUTH RINSE
15.0000 mL | Freq: Two times a day (BID) | OROMUCOSAL | Status: DC
  Administered 2018-02-08: 15 mL via OROMUCOSAL

## 2018-02-07 MED ORDER — METHYLPREDNISOLONE SODIUM SUCC 125 MG IJ SOLR
60.0000 mg | Freq: Four times a day (QID) | INTRAMUSCULAR | Status: AC
  Administered 2018-02-07 (×3): 60 mg via INTRAVENOUS
  Filled 2018-02-07 (×3): qty 2

## 2018-02-07 MED ORDER — CARVEDILOL 12.5 MG PO TABS
25.0000 mg | ORAL_TABLET | Freq: Two times a day (BID) | ORAL | Status: DC
  Administered 2018-02-07 – 2018-02-08 (×3): 25 mg via ORAL
  Filled 2018-02-07 (×3): qty 2

## 2018-02-07 MED ORDER — FUROSEMIDE 10 MG/ML IJ SOLN
40.0000 mg | Freq: Two times a day (BID) | INTRAMUSCULAR | Status: AC
  Administered 2018-02-07 (×2): 40 mg via INTRAVENOUS
  Filled 2018-02-07 (×2): qty 4

## 2018-02-07 MED ORDER — FUROSEMIDE 40 MG PO TABS
40.0000 mg | ORAL_TABLET | Freq: Two times a day (BID) | ORAL | Status: DC
  Administered 2018-02-08: 40 mg via ORAL
  Filled 2018-02-07: qty 1

## 2018-02-07 MED ORDER — INSULIN ASPART 100 UNIT/ML ~~LOC~~ SOLN
0.0000 [IU] | Freq: Every day | SUBCUTANEOUS | Status: DC
  Administered 2018-02-07: 4 [IU] via SUBCUTANEOUS

## 2018-02-07 MED ORDER — INSULIN GLARGINE 100 UNIT/ML ~~LOC~~ SOLN
15.0000 [IU] | Freq: Every day | SUBCUTANEOUS | Status: DC
  Administered 2018-02-07 – 2018-02-08 (×2): 15 [IU] via SUBCUTANEOUS
  Filled 2018-02-07 (×3): qty 0.15

## 2018-02-07 MED ORDER — PREDNISONE 20 MG PO TABS
60.0000 mg | ORAL_TABLET | Freq: Every day | ORAL | Status: DC
  Administered 2018-02-08: 60 mg via ORAL
  Filled 2018-02-07: qty 3

## 2018-02-07 MED ORDER — INSULIN ASPART 100 UNIT/ML ~~LOC~~ SOLN
0.0000 [IU] | Freq: Three times a day (TID) | SUBCUTANEOUS | Status: DC
  Administered 2018-02-07: 15 [IU] via SUBCUTANEOUS
  Administered 2018-02-08: 8 [IU] via SUBCUTANEOUS

## 2018-02-07 NOTE — Progress Notes (Signed)
Patient asleep at 02:00 did not awaken even with stimulus. Saturation 91 on 4 lpm Breath rate 24

## 2018-02-07 NOTE — Progress Notes (Signed)
PROGRESS NOTE  Mario LovingsJames R Red ZOX:[id number] DOB: 10/25/1940 DOA: 02/06/2018 PCP: [name], [name], MD  Brief History:   77 y.o. male with medical history of tobacco abuse, diabetes mellitus, hypertension, and hyperlipidemia presenting with 2 to 6523-month history of worsening shortness of breath that has significantly worsened over the past 1 week.  Patient complains primarily of dyspnea on exertion, but he denies any fevers, chills, chest pain, nausea, vomiting, diarrhea, abdominal pain, coughing, hemoptysis.  He states that he is having shortness of breath even walking to his bathroom from his bedroom.  The patient has noted some increase in lower extremity edema, but states that it has been unchanged in the past 1 to 2 weeks.  The patient was placed on furosemide approximately 2 months prior to this admission.  [facility] approximately 1 month prior to this admission for CHF.  He was discharged home with furosemide 40 mg daily.  he does complain of fluctuating lower extremity edema and some mild increase in his abdominal girth.  He denies any recent travels or sick contacts.  The patient has a remote history of tobacco with a total of approximately 30 pack years.  He quit smoking 50 years ago. In the emergency department, the patient desaturated to 69% on his usual 4 L.  The patient is afebrile hemodynamically stable saturating 96-100% on 4 L.  CBC was essentially unremarkable.  BMP showed serum creatinine 1.32.  LFTs were unremarkable.  BNP was 329.  Initial troponin was negative.  Chest x-ray showed mild bilateral perihilar opacification suggesting a vascular congestion.  ABG revealed 7.34/50/143/25 on 4 L.   Assessment/Plan: Acute on chronic respiratory failure -Patient is normally on 2 L baseline -currently stable on 3-4L -Etiology unclear, but suspect this is multifactorial including CHF, underlying COPD and  underlying interstitial lung disease -Wean  oxygen as tolerated back to 2 L for saturation greater 92% -02/06/18 CT chest--moderately enlarged mediastinal lymphadenopathy with enlarged right pretracheal and right supra carinal lymph nodes; mild thickening and distortion of the interstitium with upper lobe predominant paraseptal emphysema; mosaic attenuation of the lung parenchyma with possible interstitial edema; shotty retroperitoneal and mesenteric lymphadenopathy -check ANA -check ANCA  COPD exacerbation -continue Pulmicort -Continue IV Solu-Medrol -Continue duo nebs -Will ultimately need PFTs as an outpatient -viral respiratory panel -personally reviewed CXR--increased interstitial markings; LL atelectasis  Acute CHF, type unspecified -increase lasix to 40 mg IV bid -Echocardiogram -Daily weights--NEG 0.3kg -Accurate I's and O's--NEG 1 L  Diabetes mellitus type 2 -NovoLog sliding scale -Hemoglobin A1c--6.6 -Holding glimepiride and metformin  Essential hypertension -Continue carvedilol  CKD stage II -Baseline creatinine 0.9-1.2 -Presenting creatinine 1.32 -Monitor with diuresis  RBBB -no 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  He denies any fevers, chills, chest pain, nausea, vomiting, diarrhea, abdominal pain but there is no dysuria hematuria.  Objective: Vitals:   02/06/18 1504 02/06/18 2053 02/06/18 2101 02/07/18 0640  BP: (!) 168/86   (!) 152/71  Pulse: 72   91  Resp: 18   12  Temp: 97.6 F (36.4 C)   98.8 F (37.1 C)  TempSrc: Oral   Oral  SpO2: 96% (!) 88% 94% 99%  Weight:    90.4 kg  Height:        Intake/Output Summary (Last  24 hours) at 02/07/2018 0746 Last data filed at 02/06/2018 2215 Gross per 24 hour  Intake 483 ml  Output 1500 ml  Net -1017 ml   Weight change:  Exam:   General:  Pt is alert, follows commands appropriately, not in acute distress  HEENT: No icterus, No thrush, No neck mass, [place]/AT  Cardiovascular: RRR, S1/S2, no rubs, no gallops  Respiratory: Bibasilar crackles but no wheezing.  Good air movement.  Abdomen: Soft/+BS, non tender, non distended, no guarding  Extremities: 1 + LE edema, No lymphangitis, No petechiae, No rashes, no synovitis   Data Reviewed: I have personally reviewed following labs and imaging studies Basic Metabolic Panel: Recent Labs  Lab 02/06/18 1203 02/07/18 0524  NA 138 136  K 5.0 4.1  CL 100 96*  CO2 29 30  GLUCOSE 180* 226*  BUN 25* 27*  CREATININE 1.32* 1.16  CALCIUM 9.7 9.4   Liver Function Tests: Recent Labs  Lab 02/06/18 1203  AST 16  ALT 12  ALKPHOS 66  BILITOT 1.1  PROT 8.5*  ALBUMIN 3.9   No results for input(s): LIPASE, AMYLASE in the last 168 hours. No results for input(s): AMMONIA in the last 168 hours. Coagulation Profile: No results for input(s): INR, PROTIME in the last 168 hours. CBC: Recent Labs  Lab 02/06/18 1203  WBC 7.9  NEUTROABS 5.5  HGB 11.6*  HCT 38.3*  MCV 92.3  PLT 227   Cardiac Enzymes: Recent Labs  Lab 02/06/18 1203  TROPONINI <0.03   BNP: Invalid input(s): POCBNP CBG: Recent Labs  Lab 02/06/18 1629 02/06/18 2134  GLUCAP 151* 300*   HbA1C: Recent Labs    02/06/18 1203  HGBA1C 6.6*   Urine analysis:    Component Value Date/Time   COLORURINE STRAW (A) 02/06/2018 1415   APPEARANCEUR CLEAR 02/06/2018 1415   LABSPEC 1.006 02/06/2018 1415   PHURINE 7.0 02/06/2018 1415   GLUCOSEU NEGATIVE 02/06/2018 1415   HGBUR NEGATIVE 02/06/2018 1415   BILIRUBINUR NEGATIVE 02/06/2018 1415   KETONESUR NEGATIVE 02/06/2018 1415   PROTEINUR NEGATIVE 02/06/2018 1415   NITRITE NEGATIVE 02/06/2018 1415     LEUKOCYTESUR NEGATIVE 02/06/2018 1415   Sepsis Labs: @LABRCNTIP (procalcitonin:4,lacticidven:4) ) Recent Results (from the past 240 hour(s))  Culture, blood (routine x 2)     Status: None (Preliminary result)   Collection Time: 02/06/18 12:35 PM  Result Value Ref Range Status   Specimen Description BLOOD LEFT HAND  Final   Special Requests   Final    BOTTLES DRAWN AEROBIC AND ANAEROBIC Blood Culture adequate volume   Culture   Final    NO GROWTH < 12 HOURS Performed at [facility], [address], [place], [place] [address]    Report Status PENDING  Incomplete  Culture, blood (routine x 2)     Status: None (Preliminary result)   Collection Time: 02/06/18 12:42 PM  Result Value Ref Range Status   Specimen Description BLOOD  Final   Special Requests NONE  Final   Culture   Final    NO GROWTH < 12 HOURS Performed at [facility], [address] [place], [place] [address]    Report Status PENDING  Incomplete     Scheduled Meds: . aspirin EC  81 mg  Oral Daily  . atorvastatin  20 mg Oral Daily  . budesonide (PULMICORT) nebulizer solution  0.5 mg Nebulization BID  . carvedilol  12.5 mg Oral BID WC  . enoxaparin (LOVENOX) injection  40 mg Subcutaneous Q24H  . furosemide  40 mg Intravenous Daily  . insulin aspart  0-5 Units Subcutaneous QHS  . insulin aspart  0-9 Units Subcutaneous TID WC  . ipratropium-albuterol  3 mL Nebulization Q6H  . [START ON 02/08/2018] mouth rinse  15 mL Mouth Rinse BID  . methylPREDNISolone (SOLU-MEDROL) injection  60 mg Intravenous Q6H  . pantoprazole  40 mg Oral Daily  . sodium chloride flush  3 mL Intravenous Q12H   Continuous Infusions: . sodium chloride      Procedures/Studies: Ct Chest Wo Contrast  Result Date: 02/06/2018 CLINICAL DATA:  Increasing shortness of breath. EXAM: CT CHEST WITHOUT CONTRAST TECHNIQUE: Multidetector CT imaging of the chest was performed following the standard protocol without IV contrast. COMPARISON:  Chest  radiograph 02/06/2018 FINDINGS: Cardiovascular: Enlarged heart. Minimal pericardial thickening. Calcific atherosclerotic disease of the coronary arteries and aorta. Mediastinum/Nodes: Moderately enlarged bilateral mediastinal lymph nodes at all stations. Index right pretracheal lymph node measures 12 mm in short axis and right supra carinal lymph node measures 14 mm in short axis. Lungs/Pleura: Mild thickening and distortion of the interstitium with upper lobe predominant paraseptal emphysema. Mosaic attenuation of the lung parenchyma. Probable mild interstitial pulmonary edema. No pleural effusions or lobar consolidation. Upper Abdomen: Cholelithiasis. Shotty retroperitoneal and mesenteric lymph nodes, sub pathologic by CT criteria. Atherosclerotic calcifications of the aorta. Musculoskeletal: Spondylosis of the thoracic spine. IMPRESSION: Chronic interstitial lung changes with mosaic attenuation of the lung parenchyma. Differential diagnosis includes obstructive small airway disease, air trapping, or occlusive vascular disease. Emphysema. Mediastinal lymphadenopathy, likely reactive. Enlarged heart with mild interstitial pulmonary edema. Aortic Atherosclerosis (ICD10-I70.0) and Emphysema (ICD10-J43.9). Electronically Signed   By: Ted Mcalpineobrinka  Dimitrova M.D.   On: 02/06/2018 17:17   Dg Chest Port 1 View  Result Date: 02/06/2018 CLINICAL DATA:  Shortness of breath worsening today. EXAM: PORTABLE CHEST 1 VIEW COMPARISON:  None. FINDINGS: Lungs are adequately inflated with mild hazy perihilar bibasilar opacification likely due to mild vascular congestion. Infection or atelectasis in the lung bases is possible. Moderate cardiomegaly. Degenerative change of the spine. IMPRESSION: Mild perihilar bibasilar opacification suggesting vascular congestion. Atelectasis or infection in the lung bases is possible. Moderate cardiomegaly. Electronically Signed   By: Elberta Fortisaniel  Boyle M.D.   On: 02/06/2018 13:07    Catarina Hartshornavid Marquail Bradwell,  DO  [facility] Pager [phone]  If 7PM-7AM, please contact night-coverage [email or web address] Password [email or web address] 02/07/2018, 7:46 AM   LOS: 1 day

## 2018-02-07 NOTE — Care Management Note (Signed)
Case Management Note  Patient Details  Name: Mario Walker MRN: [id number] Date of Birth: 08/24/1940  Subjective/Objective:      CHF/COPD exacerbation. Pt from home, lives alone, has gf and getting married in November. Pt ind with ADL's, has insurance and PCP. Pt drives. Pt has home oxygen. Does NOT have a neb machine. Pt ambulates free of AD. Pt see's MD's regularly. He weighs himself 2-3 times a week. He is SOMETIMES compliant with his diet.              Action/Plan: DC home with self care. No CM needs noted at this time.   Expected Discharge Date:    02/08/18              Expected Discharge Plan:  Home/Self Care  In-House Referral:  NA  Discharge planning Services  CM Consult  Post Acute Care Choice:  NA Choice offered to:  NA  DME Arranged:    DME Agency:     HH Arranged:    HH Agency:     Status of Service:  Completed, signed off  If discussed at [employer] Length of Stay Meetings, dates discussed:    Additional Comments:  Malcolm MetroChildress, Donetta Isaza Demske, RN 02/07/2018, 12:49 PM

## 2018-02-07 NOTE — Progress Notes (Signed)
*  PRELIMINARY RESULTS* Echocardiogram 2D Echocardiogram has been performed.  Stacey DrainWhite, Calen Posch J 02/07/2018, 4:14 PM

## 2018-02-07 NOTE — Progress Notes (Addendum)
Inpatient Diabetes Program Recommendations  AACE/ADA: New Consensus Statement on Inpatient Glycemic Control (2015)  Target Ranges:  Prepandial:   less than 140 mg/dL      Peak postprandial:   less than 180 mg/dL (1-2 hours)      Critically ill patients:  140 - 180 mg/dL   Lab Results  Component Value Date   GLUCAP 403 (H) 02/07/2018   HGBA1C 6.6 (H) 02/06/2018    Review of Glycemic Control Results for Mario Walker, Mario Walker (MRN [id number]) as of 02/07/2018 13:15  Ref. Range 02/06/2018 16:29 02/06/2018 21:34 02/07/2018 08:12 02/07/2018 11:50  Glucose-Capillary Latest Ref Range: 70 - 99 mg/dL 409151 (H) 811300 (H) 914227 (H) 403 (H)   Diabetes history: DM2 Outpatient Diabetes medications: Amaryl 2 mg + Metformin 1 gm bid + Novolog 3 units tid meal coverage Current orders for Inpatient glycemic control: Novolog correction sensitive + hs 0-5  Inpatient Diabetes Program Recommendations:   While oral medications held: -Lantus 18 units daily -Novolog 3 units tid meal coverage if eats 50% Text page to Dr. Arbutus Leasat with recommendations.  Thank you, Billy FischerJudy E. Ellese Julius, RN, MSN, CDE  Diabetes Coordinator Inpatient Glycemic Control Team Team Pager [phone] (8am-5pm) 02/07/2018 1:19 PM

## 2018-02-07 NOTE — Discharge Summary (Signed)
Physician Discharge Summary  Mario LovingsJames R Walker VHQ:[id number] DOB: 05/25/1941 DOA: 02/06/2018  PCP: Mario Walker, [name], MD  Admit date: 02/06/2018 Discharge date:  02/08/18  Admitted From: Home Disposition:  Home   Recommendations for Outpatient Follow-up:  1. Follow up with PCP in 1-2 weeks 2. Please obtain BMP/CBC in one week 3. Please refer patient to cardiothoracic surgery for mediastinal lymph node biopsy     Discharge Condition: Stable CODE STATUS:FULL Diet recommendation: Heart Healthy / Carb Modified   Brief/Interim Summary: 77 y.o.malewith medical history oftobacco abuse, diabetes mellitus, hypertension, and hyperlipidemia presenting with 2 to 629-month history of worsening shortness of breath that has significantly worsened over the past 1 week. Patient complains primarily of dyspnea on exertion, but he denies any fevers, chills, chest pain, nausea, vomiting, diarrhea, abdominal pain, coughing, hemoptysis. He states that he is having shortness of breath even walking to his bathroom from his bedroom. The patient has noted some increase in lower extremity edema, but states that it has been unchanged in the past 1 to 2 weeks. The patient was placed on furosemide approximately 2 months prior to this admission. [facility]. He was discharged home with furosemide 40 mg daily.  he does complain of fluctuating lower extremity edema and some mild increase in his abdominal girth. He denies any recent travels or sick contacts. The patient has a remote history of tobacco with a total of approximately 30 pack years. He quit smoking 50 years ago. In the emergency department, the patient desaturated to 69% on his usual4L. The patient is afebrile hemodynamically stable saturating 96-100% on 4 L. CBC was essentially unremarkable. BMP showed serum creatinine 1.32. LFTs were unremarkable. BNP was 329.  Initial troponin was negative. Chest x-ray showed mild bilateral perihilar opacification suggesting a vascular congestion. ABG revealed 7.34/50/143/25 on 4 L.   Discharge Diagnoses:  Acute on chronic respiratory failure -Patient is normally on 2 L baseline -currently stable on 3-4L>>>weaned to 2L -Etiology unclear, but suspect this is multifactorial including CHF, underlying COPD and  underlying interstitial lung disease -Wean oxygen as tolerated back to 2 L for saturation greater 92% -02/06/18 CT chest--moderately enlarged mediastinal lymphadenopathy with enlarged right pretracheal and right supra carinal lymph nodes; mild thickening and distortion of the interstitium with upper lobe predominant paraseptal emphysema; mosaic attenuation of the lung parenchyma with possible interstitial edema; shotty retroperitoneal and mesenteric lymphadenopathy -check ANA--pending at time of d/c -check ANCA--pending at time of dc -outpatient referral to [facility] for mediastinoscopy--pt is informed about CT chest findings  COPD exacerbation -continue Pulmicort -Continue IV Solu-Medrol>>>home with po prednisone -Continue duo nebs -Will ultimately need PFTs as an outpatient -viral respiratory panel--neg -personally reviewed CXR--increased interstitial markings; LL atelectasis  Acutediastolic CHF -increase lasix to 40 mg IV bid>>home with lasix 40 mg po bid -primarily cor pulmonale -Echocardiogram--EF 55-60%, no WMA, diastolic dysfunction, grade indeterminate, RV pressure overload, PASP 65 -Daily weights--NEG 4kg  Diabetes mellitus type 2 -NovoLog sliding scale -Hemoglobin A1c--6.6 -Holding glimepirideand metformin -pt states he also take levemir 35 units at hs which he can resume after d/c  Essential hypertension -Continue carvedilol  CKD stage II -Baseline creatinine 0.9-1.2 -Presenting creatinine 1.32 -serum creatinine 1.30 on day of d/c -Monitor with diuresis  RBBB -no old EKGs to  compare but appears to 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   aspirin EC 81 MG tablet Take 81 mg by mouth daily.   atorvastatin 40 MG tablet Commonly known as:  LIPITOR Take 1 tablet by mouth daily.   busPIRone 10 MG tablet Commonly known as:  BUSPAR Take 10 mg by mouth 3 (three) times daily.   carvedilol 25 MG tablet Commonly known as:  COREG Take 1 tablet by mouth 2 (two) times daily.   doxazosin 8 MG tablet Commonly known as:  CARDURA Take 1 tablet by mouth daily.   DULoxetine 60 MG capsule Commonly known as:  CYMBALTA Take 60 mg by mouth daily.   FISH OIL PO Take 1 capsule by mouth daily.   furosemide 40 MG tablet Commonly known as:  LASIX Take 40 mg by mouth 2 (two) times daily.   glimepiride 2 MG tablet Commonly known as:  AMARYL Take 1 tablet by mouth daily.   HYDROcodone-acetaminophen 5-325 MG tablet Commonly known as:  NORCO/VICODIN Take 1-2 tablets by mouth every 4 (four) hours as needed.   insulin aspart 100 UNIT/ML injection Commonly known as:  novoLOG Inject into the skin 3 (three) times daily before meals.   metFORMIN 500 MG tablet Commonly known as:  GLUCOPHAGE Take 1,000 mg by mouth 2 (two) times daily with a meal.   multivitamin tablet Take 1 tablet by mouth daily.   multivitamin-iron-minerals-folic acid chewable tablet Chew 1 tablet by mouth daily.   omeprazole 40 MG capsule Commonly known as:  PRILOSEC Take 1 capsule by mouth daily.   predniSONE 20 MG tablet Commonly known as:  DELTASONE Take 3 tablets (60 mg total) by mouth daily  with breakfast. And decrease by one tablet daily Start taking on:  02/09/2018   REDNESS RELIEVER EYE DROPS 0.05 % ophthalmic solution Generic drug:  tetrahydrozoline Place 1 drop into both eyes daily.   VITAMIN A PO Take 1 tablet by mouth daily.   VITAMIN B-12 PO Take 1 tablet by mouth daily.   VITAMIN C PO Take 1 tablet by mouth daily.   VITAMIN D PO Take 1 tablet by mouth daily.            Durable Medical Equipment  (From admission, onward)         Start     Ordered   02/08/18 0947  For home use only DME Nebulizer machine  Once    Question:  Patient needs a nebulizer to treat with the following condition  Answer:  COPD (chronic obstructive pulmonary disease) (HCC)   02/08/18 0947         Follow-up Information    Follow up In 2 weeks.   Contact information: [facility] [address] [place] [address] [phone]       Kari Baars, MD In 1 week.   Specialty:  Pulmonary Disease Why:  Office will call with appointment, if you do not hear from them in a week. Please call office for appointment. Contact information: [address] [place] [address] [phone]          No Known Allergies  Consultations:  none   Procedures/Studies: Ct  Chest Wo Contrast  Result Date: 02/06/2018 CLINICAL DATA:  Increasing shortness of breath. EXAM: CT CHEST WITHOUT CONTRAST TECHNIQUE: Multidetector CT imaging of the chest was performed following the standard protocol without IV contrast. COMPARISON:  Chest radiograph 02/06/2018 FINDINGS: Cardiovascular: Enlarged heart. Minimal pericardial thickening. Calcific atherosclerotic disease of the coronary arteries and aorta. Mediastinum/Nodes: Moderately enlarged bilateral mediastinal lymph nodes at all stations. Index right pretracheal lymph node measures 12 mm in short axis and right supra carinal lymph node measures 14 mm in short axis. Lungs/Pleura: Mild thickening  and distortion of the interstitium with upper lobe predominant paraseptal emphysema. Mosaic attenuation of the lung parenchyma. Probable mild interstitial pulmonary edema. No pleural effusions or lobar consolidation. Upper Abdomen: Cholelithiasis. Shotty retroperitoneal and mesenteric lymph nodes, sub pathologic by CT criteria. Atherosclerotic calcifications of the aorta. Musculoskeletal: Spondylosis of the thoracic spine. IMPRESSION: Chronic interstitial lung changes with mosaic attenuation of the lung parenchyma. Differential diagnosis includes obstructive small airway disease, air trapping, or occlusive vascular disease. Emphysema. Mediastinal lymphadenopathy, likely reactive. Enlarged heart with mild interstitial pulmonary edema. Aortic Atherosclerosis (ICD10-I70.0) and Emphysema (ICD10-J43.9). Electronically Signed   By: Ted Mcalpineobrinka  Dimitrova M.D.   On: 02/06/2018 17:17   Dg Chest Port 1 View  Result Date: 02/06/2018 CLINICAL DATA:  Shortness of breath worsening today. EXAM: PORTABLE CHEST 1 VIEW COMPARISON:  None. FINDINGS: Lungs are adequately inflated with mild hazy perihilar bibasilar opacification likely due to mild vascular congestion. Infection or atelectasis in the lung bases is possible. Moderate cardiomegaly. Degenerative change of the spine. IMPRESSION: Mild perihilar bibasilar opacification suggesting vascular congestion. Atelectasis or infection in the lung bases is possible. Moderate cardiomegaly. Electronically Signed   By: Elberta Fortisaniel  Boyle M.D.   On: 02/06/2018 13:07        Discharge Exam: Vitals:   02/08/18 0711 02/08/18 0741  BP: (!) 161/79   Pulse: 78   Resp: 19   Temp: 97.6 F (36.4 C)   SpO2: 96% 96%   Vitals:   02/07/18 2034 02/07/18 2132 02/08/18 0711 02/08/18 0741  BP:  (!) 146/71 (!) 161/79   Pulse:  77 78   Resp:  19 19   Temp:  98.2 F (36.8 C) 97.6 F (36.4 C)   TempSrc:  Oral Oral   SpO2: 94% 97% 96% 96%  Weight:   86.7 kg   Height:        General: Pt  is alert, awake, not in acute distress Cardiovascular: RRR, S1/S2 +, no rubs, no gallops Respiratory: bibasilar rales, no wheeze Abdominal: Soft, NT, ND, bowel sounds + Extremities: no edema, no cyanosis   The results of significant diagnostics from this hospitalization (including imaging, microbiology, ancillary and laboratory) are listed below for reference.    Significant Diagnostic Studies: Ct Chest Wo Contrast  Result Date: 02/06/2018 CLINICAL DATA:  Increasing shortness of breath. EXAM: CT CHEST WITHOUT CONTRAST TECHNIQUE: Multidetector CT imaging of the chest was performed following the standard protocol without IV contrast. COMPARISON:  Chest radiograph 02/06/2018 FINDINGS: Cardiovascular: Enlarged heart. Minimal pericardial thickening. Calcific atherosclerotic disease of the coronary arteries and aorta. Mediastinum/Nodes: Moderately enlarged bilateral mediastinal lymph nodes at all stations. Index right pretracheal lymph node measures 12 mm in short axis and right supra carinal lymph node measures 14 mm in short axis. Lungs/Pleura: Mild thickening and distortion of the interstitium with upper lobe predominant paraseptal emphysema. Mosaic attenuation of the lung parenchyma. Probable mild interstitial pulmonary edema. No pleural effusions or lobar consolidation. Upper Abdomen: Cholelithiasis. Shotty retroperitoneal and mesenteric lymph  nodes, sub pathologic by CT criteria. Atherosclerotic calcifications of the aorta. Musculoskeletal: Spondylosis of the thoracic spine. IMPRESSION: Chronic interstitial lung changes with mosaic attenuation of the lung parenchyma. Differential diagnosis includes obstructive small airway disease, air trapping, or occlusive vascular disease. Emphysema. Mediastinal lymphadenopathy, likely reactive. Enlarged heart with mild interstitial pulmonary edema. Aortic Atherosclerosis (ICD10-I70.0) and Emphysema (ICD10-J43.9). Electronically Signed   By: Ted Mcalpine M.D.    On: 02/06/2018 17:17   Dg Chest Port 1 View  Result Date: 02/06/2018 CLINICAL DATA:  Shortness of breath worsening today. EXAM: PORTABLE CHEST 1 VIEW COMPARISON:  None. FINDINGS: Lungs are adequately inflated with mild hazy perihilar bibasilar opacification likely due to mild vascular congestion. Infection or atelectasis in the lung bases is possible. Moderate cardiomegaly. Degenerative change of the spine. IMPRESSION: Mild perihilar bibasilar opacification suggesting vascular congestion. Atelectasis or infection in the lung bases is possible. Moderate cardiomegaly. Electronically Signed   By: Elberta Fortis M.D.   On: 02/06/2018 13:07     Microbiology: Recent Results (from the past 240 hour(s))  Culture, blood (routine x 2)     Status: None (Preliminary result)   Collection Time: 02/06/18 12:35 PM  Result Value Ref Range Status   Specimen Description BLOOD LEFT HAND  Final   Special Requests   Final    BOTTLES DRAWN AEROBIC AND ANAEROBIC Blood Culture adequate volume   Culture   Final    NO GROWTH 2 DAYS Performed at [facility], [address], [place], [place] [address]    Report Status PENDING  Incomplete  Culture, blood (routine x 2)     Status: None (Preliminary result)   Collection Time: 02/06/18 12:42 PM  Result Value Ref Range Status   Specimen Description BLOOD LEFT HAND  Final   Special Requests   Final    BOTTLES DRAWN AEROBIC AND ANAEROBIC Blood Culture adequate volume   Culture   Final    NO GROWTH 2 DAYS Performed at [facility], [address] [place], [place] [address]    Report Status PENDING  Incomplete  Respiratory Panel by PCR     Status: None   Collection Time: 02/06/18  2:56 PM  Result Value Ref Range Status   Adenovirus NOT DETECTED NOT DETECTED Final   Coronavirus 229E NOT DETECTED NOT DETECTED Final   Coronavirus HKU1 NOT DETECTED NOT DETECTED Final   Coronavirus NL63 NOT DETECTED NOT DETECTED Final   Coronavirus OC43 NOT DETECTED NOT DETECTED Final    Metapneumovirus NOT DETECTED NOT DETECTED Final   Rhinovirus / Enterovirus NOT DETECTED NOT DETECTED Final   Influenza A NOT DETECTED NOT DETECTED Final   Influenza B NOT DETECTED NOT DETECTED Final   Parainfluenza Virus 1 NOT DETECTED NOT DETECTED Final   Parainfluenza Virus 2 NOT DETECTED NOT DETECTED Final   Parainfluenza Virus 3 NOT DETECTED NOT DETECTED Final   Parainfluenza Virus 4 NOT DETECTED NOT DETECTED Final   Respiratory Syncytial Virus NOT DETECTED NOT DETECTED Final   Bordetella pertussis NOT DETECTED NOT DETECTED Final   Chlamydophila pneumoniae NOT DETECTED NOT DETECTED Final   Mycoplasma pneumoniae NOT DETECTED NOT DETECTED Final    Comment: Performed at [facility], [address] [place], [place] [address]     Labs: Basic Metabolic Panel: Recent Labs  Lab 02/06/18 1203 02/07/18 0524 02/08/18 0551  NA 138 136 138  K 5.0 4.1 4.3  CL 100 96* 97*  CO2 29 30 29   GLUCOSE 180* 226* 266*  BUN 25*  27* 37*  CREATININE 1.32* 1.16 1.30*  CALCIUM 9.7 9.4 9.2   Liver Function Tests: Recent Labs  Lab 02/06/18 1203  AST 16  ALT 12  ALKPHOS 66  BILITOT 1.1  PROT 8.5*  ALBUMIN 3.9   No results for input(s): LIPASE, AMYLASE in the last 168 hours. No results for input(s): AMMONIA in the last 168 hours. CBC: Recent Labs  Lab 02/06/18 1203  WBC 7.9  NEUTROABS 5.5  HGB 11.6*  HCT 38.3*  MCV 92.3  PLT 227   Cardiac Enzymes: Recent Labs  Lab 02/06/18 1203  TROPONINI <0.03   BNP: Invalid input(s): POCBNP CBG: Recent Labs  Lab 02/07/18 0812 02/07/18 1150 02/07/18 1653 02/07/18 2134 02/08/18 0800  GLUCAP 227* 403* 353* 342* 254*    Time coordinating discharge:  36 minutes  Signed:  Catarina Hartshorn, DO [facility] Pager: [phone] 02/08/2018, 10:11 AM

## 2018-02-08 LAB — BASIC METABOLIC PANEL
Anion gap: 12 (ref 5–15)
BUN: 37 mg/dL — ABNORMAL HIGH (ref 8–23)
CALCIUM: 9.2 mg/dL (ref 8.9–10.3)
CO2: 29 mmol/L (ref 22–32)
Chloride: 97 mmol/L — ABNORMAL LOW (ref 98–111)
Creatinine, Ser: 1.3 mg/dL — ABNORMAL HIGH (ref 0.61–1.24)
GFR calc non Af Amer: 51 mL/min — ABNORMAL LOW (ref >60)
GFR, EST AFRICAN AMERICAN: 59 mL/min — AB (ref >60)
GLUCOSE: 266 mg/dL — AB (ref 70–99)
Potassium: 4.3 mmol/L (ref 3.5–5.1)
Sodium: 138 mmol/L (ref 135–145)

## 2018-02-08 LAB — MPO/PR-3 (ANCA) ANTIBODIES: ANCA Proteinase 3: <3.5 U/mL (ref 0.0–3.5)

## 2018-02-08 LAB — GLUCOSE, CAPILLARY: Glucose-Capillary: 254 mg/dL — ABNORMAL HIGH (ref 70–99)

## 2018-02-08 MED ORDER — AMLODIPINE BESYLATE 5 MG PO TABS: 5.0000 mg | ORAL_TABLET | Freq: Every day | ORAL | Status: DC

## 2018-02-08 MED ORDER — INSULIN GLARGINE 100 UNIT/ML ~~LOC~~ SOLN
10.0000 [IU] | Freq: Once | SUBCUTANEOUS | Status: DC
  Filled 2018-02-08: qty 0.1

## 2018-02-08 MED ORDER — PREDNISONE 20 MG PO TABS: 60.0000 mg | ORAL_TABLET | Freq: Every day | ORAL | 0 refills | Status: DC

## 2018-02-08 MED ORDER — IPRATROPIUM-ALBUTEROL 0.5-2.5 (3) MG/3ML IN SOLN: 3.0000 mL | Freq: Three times a day (TID) | RESPIRATORY_TRACT | Status: DC

## 2018-02-08 NOTE — Care Management (Signed)
Patient Information   Patient Name Mario Walker, Mario Walker ([id number]) Sex Male DOB 03/19/1941  Room Bed  A328 A328-01  Patient Demographics   Address [address] [place] [id number] Phone [phone] [facility](Home) [phone] (Mobile)  Patient Ethnicity & Race   Ethnic Group Patient Race  Not Hispanic or Latino White or Caucasian  Emergency Contact(s)   Name Relation Home Work Mobile  [place],Rusty Son [phone]    Odum,Linda Significant other   [phone]  Documents on File    Status Date Received Description  Documents for the Patient  [place] HIPAA NOTICE OF PRIVACY - Scanned Not Received    [facility] E-Signature HIPAA Notice of Privacy Received 03/11/14   Driver's License Not Received    Insurance Card Received 03/11/14 HUMANA/MEDICARE  Advance Directives/Living Will/HCPOA/POA Not Received    Other Photo ID Not Received    Insurance Card Received 02/06/18 Monia Pouchaetna mcr  Patient Photo   Photo of Patient  Documents for the Encounter  AOB (Assignment of Insurance Benefits) Not Received    E-signature AOB Signed 02/06/18   MEDICARE RIGHTS Not Received    E-signature Medicare Rights Signed 02/06/18   ED Patient Billing Extract   ED PB Billing Extract  Cardiac Monitoring Strip Shift Summary Received 02/06/18   HIM Release of Information Output   [id number]  EKG Received 02/07/18   Admission Information   Attending Provider Admitting Provider Admission Type Admission Date/Time  Tat, Onalee Huaavid, MD Catarina Hartshornat, David, MD Emergency 02/06/18 1150  Discharge Date Hospital Service Auth/Cert Status Service Area   Internal Medicine Incomplete [facility]  Unit Room/Bed Admission Status   AP-DEPT 300 A328/A328-01 Admission (Confirmed)   Admission   Complaint  sob  Hospital Account   Name Acct ID Class Status Primary Coverage  Mario Walker, Mario Walker [id number] Inpatient Open AETNA MEDICARE - AETNA MEDICARE HMO/PPO      Guarantor Account (for Hospital Account  [id number])   Name Relation to Pt Service Area Active? Acct Type  Rhea BleacherLewis, Rogelio Self CHSA Yes Personal/Family  Address Phone    [address] [place] [id number] [phone](H)        Coverage Information (for Hospital Account [id number])   F/O Payor/Plan Precert #  AETNA MEDICARE/AETNA MEDICARE HMO/PPO   Subscriber Subscriber #  Mario Walker, [id number]  Address Phone  [id number] [place] [id number] [phone]

## 2018-02-08 NOTE — Care Management Important Message (Signed)
Important Message  Patient Details  Name: Mario LovingsJames R Witherington MRN: [id number] Date of Birth: 07/30/1940   Medicare Important Message Given:  Yes    Renie OraHawkins, Adham Johnson Smith 02/08/2018, 10:22 AM

## 2018-02-08 NOTE — Progress Notes (Signed)
Mario Mario Walker R Mario Walker discharged Home per MD order.  Discharge instructions reviewed and discussed with the patient, all questions and concerns answered. Copy of instructions and scripts given to patient.  Allergies as of 02/08/2018   No Known Allergies     Medication List    STOP taking these medications   diclofenac 50 MG tablet Commonly known as:  CATAFLAM   hydrochlorothiazide 25 MG tablet Commonly known as:  HYDRODIURIL     TAKE these medications   amLODipine 5 MG tablet Commonly known as:  NORVASC Take 1 tablet by mouth daily.   aspirin EC 81 MG tablet Take 81 mg by mouth daily.   atorvastatin 40 MG tablet Commonly known as:  LIPITOR Take 1 tablet by mouth daily.   busPIRone 10 MG tablet Commonly known as:  BUSPAR Take 10 mg by mouth 3 (three) times daily.   carvedilol 25 MG tablet Commonly known as:  COREG Take 1 tablet by mouth 2 (two) times daily.   doxazosin 8 MG tablet Commonly known as:  CARDURA Take 1 tablet by mouth daily.   DULoxetine 60 MG capsule Commonly known as:  CYMBALTA Take 60 mg by mouth daily.   FISH OIL PO Take 1 capsule by mouth daily.   furosemide 40 MG tablet Commonly known as:  LASIX Take 40 mg by mouth 2 (two) times daily.   glimepiride 2 MG tablet Commonly known as:  AMARYL Take 1 tablet by mouth daily.   HYDROcodone-acetaminophen 5-325 MG tablet Commonly known as:  NORCO/VICODIN Take 1-2 tablets by mouth every 4 (four) hours as needed.   insulin aspart 100 UNIT/ML injection Commonly known as:  novoLOG Inject into the skin 3 (three) times daily before meals.   metFORMIN 500 MG tablet Commonly known as:  GLUCOPHAGE Take 1,000 mg by mouth 2 (two) times daily with a meal.   multivitamin tablet Take 1 tablet by mouth daily.   multivitamin-iron-minerals-folic acid chewable tablet Chew 1 tablet by mouth daily.   omeprazole 40 MG capsule Commonly known as:  PRILOSEC Take 1 capsule by mouth daily.   predniSONE 20 MG  tablet Commonly known as:  DELTASONE Take 3 tablets (60 mg total) by mouth daily with breakfast. And decrease by one tablet daily Start taking on:  02/09/2018   REDNESS RELIEVER EYE DROPS 0.05 % ophthalmic solution Generic drug:  tetrahydrozoline Place 1 drop into both eyes daily.   VITAMIN A PO Take 1 tablet by mouth daily.   VITAMIN B-12 PO Take 1 tablet by mouth daily.   VITAMIN C PO Take 1 tablet by mouth daily.   VITAMIN D PO Take 1 tablet by mouth daily.            Durable Medical Equipment  (From admission, onward)         Start     Ordered   02/08/18 0947  For home use only DME Nebulizer machine  Once    Question:  Patient needs a nebulizer to treat with the following condition  Answer:  COPD (chronic obstructive pulmonary disease) (HCC)   02/08/18 0947          Patients skin is clean, dry and intact, no evidence of skin break down. IV site discontinued and catheter remains intact. Site without signs and symptoms of complications. Dressing and pressure applied.  Patient escorted to car in a wheelchair,  no distress noted upon discharge.  Mario Mario Walker 02/08/2018 11:41 AM

## 2018-02-08 NOTE — Care Management (Signed)
Pt discharging home today. Needs neb machine. Would like to use [facility] as [facility] O2 is from there. [facility]. Pt will pick up in store today.

## 2018-02-09 LAB — ANCA TITERS
Atypical P-ANCA titer: <1:20 {titer}
C-ANCA: <1:20 {titer}
P-ANCA: <1:20 {titer}

## 2018-02-09 LAB — ANTINUCLEAR ANTIBODIES, IFA: ANA Ab, IFA: NEGATIVE

## 2018-02-11 LAB — CULTURE, BLOOD (ROUTINE X 2)
Culture: NO GROWTH
Culture: NO GROWTH
SPECIAL REQUESTS: ADEQUATE
Special Requests: ADEQUATE

## 2018-02-16 ENCOUNTER — Institutional Professional Consult (permissible substitution): Payer: Medicare HMO | Admitting: Cardiothoracic Surgery

## 2018-02-16 VITALS — BP 126/68 | HR 78 | Resp 20 | Ht 70.0 in | Wt 200.0 lb

## 2018-02-16 DIAGNOSIS — R590 Localized enlarged lymph nodes: Secondary | ICD-10-CM

## 2018-02-16 NOTE — Progress Notes (Signed)
[address]       [place] [id number]             [phone]                    Mario LovingsJames R Brammer [facility] Medical Record #[id number] Date of Birth: 01/30/1941  Referring: Catarina Hartshornat, David, MD Primary Care: [name], [name], MD Primary Cardiologist: No primary care provider on file.  Chief Complaint:    Chief Complaint  Patient presents with  . Adenopathy    Surgical eval, Chest CT 02/06/2018, ECHO 02/07/2018    History of Present Illness:    Mario Walker 77 y.o. male is seen in the office  today for evaluation of mildly enlarged mediastinal lymph nodes incidentally noted on a CT scan done several weeks ago.  The patient gives a history of progressive shortness of breath for more than 6 months that he can remember, but markedly increasing since the spring 2019.  He is been hospitalized 3 times for sudden onset of worsening shortness of breath [place] and once it [place] several weeks ago.  He is now on home oxygen.  He notes a course of steroids symptomatically helped him.  He denies any previous known cardiac disease, but does note episodes of chest tightness sometimes related to shortness of breath and sometimes not.   Patient is a previous smoker but quit smoking more than 30 years ago.  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    He is referred to consider mediastinal lymph node biopsy.  Current Activity/ Functional Status:  Patient is independent with mobility/ambulation, transfers, ADL's, IADL's.   Zubrod Score: At the time of surgery this patient's most appropriate activity status/level should be described as: []     0    Normal activity, no symptoms []     1    Restricted in physical strenuous activity but ambulatory, able to do out light work [x]     2    Ambulatory and capable of self care, unable to do work activities, up and about               >50 % of waking hours                               []     3    Only limited self care, in bed greater than 50% of waking hours []     4    Completely disabled, no self care, confined to bed or chair []     5    Moribund   Past Medical History:  Diagnosis Date  . Acid reflux   . Diabetes mellitus without complication (HCC)   . Hyperlipidemia   . Hypertension     Past Surgical History:  Procedure Laterality Date  . COLON SURGERY    . ROTATOR CUFF REPAIR      No family history on file.   Social History   Tobacco Use  Smoking Status Former Smoker  Smokeless Tobacco Never Used    Social History   Substance and Sexual Activity  Alcohol Use No     No Known Allergies  Current Outpatient Medications  Medication Sig Dispense Refill  . amLODipine (NORVASC) 5 MG tablet Take 1 tablet by mouth daily.     . Ascorbic Acid (VITAMIN C PO) Take 1  tablet by mouth daily.    Marland Kitchen aspirin EC 81 MG tablet Take 81 mg by mouth daily.    Marland Kitchen atorvastatin (LIPITOR) 40 MG tablet Take 1 tablet by mouth daily.     . busPIRone (BUSPAR) 10 MG tablet Take 10 mg by mouth 3 (three) times daily.    . carvedilol (COREG) 25 MG tablet Take 1 tablet by mouth 2 (two) times daily.    . Cholecalciferol (VITAMIN D PO) Take 1 tablet by mouth daily.    . Cyanocobalamin (VITAMIN B-12 PO) Take 1 tablet by mouth daily.    Marland Kitchen doxazosin (CARDURA) 8 MG tablet Take 1 tablet by mouth daily.    . DULoxetine (CYMBALTA) 60 MG capsule Take 60 mg by mouth daily.    . furosemide (LASIX) 40 MG tablet Take 40 mg by mouth 2 (two) times daily.    Marland Kitchen glimepiride (AMARYL) 2 MG tablet Take 1 tablet by mouth daily.    Marland Kitchen HYDROcodone-acetaminophen (NORCO) 5-325 MG per tablet Take 1-2 tablets by mouth every 4 (four) hours as needed. 6 tablet 0  . insulin aspart (NOVOLOG) 100 UNIT/ML injection Inject into the skin 3 (three) times daily before meals.    . metFORMIN (GLUCOPHAGE) 500 MG tablet Take 1,000 mg by mouth 2 (two) times daily with a meal.     . Multiple Vitamin (MULTIVITAMIN) tablet  Take 1 tablet by mouth daily.    . multivitamin-iron-minerals-folic acid (CENTRUM) chewable tablet Chew 1 tablet by mouth daily.    . Omega-3 Fatty Acids (FISH OIL PO) Take 1 capsule by mouth daily.    Marland Kitchen omeprazole (PRILOSEC) 40 MG capsule Take 1 capsule by mouth daily.     . predniSONE (DELTASONE) 20 MG tablet Take 3 tablets (60 mg total) by mouth daily with breakfast. And decrease by one tablet daily 21 tablet 0  . tetrahydrozoline (REDNESS RELIEVER EYE DROPS) 0.05 % ophthalmic solution Place 1 drop into both eyes daily.    Marland Kitchen VITAMIN A PO Take 1 tablet by mouth daily.     No current facility-administered medications for this visit.     Pertinent items are noted in HPI.   Review of Systems:     Cardiac Review of Systems: [Y] = yes  or   [ N ] = no   Chest Pain [ y   ]  Resting SOB [  y ] Exertional SOB  [name]  ]  Orthopnea [ n ]   Pedal Edema [ y  ]    Palpitations [name]  ] Syncope  [name]  ]   Presyncope [ n  ]   General Review of Systems: [Y] = yes [  ]=no Constitional: recent weight change [  ];  Wt loss over the last 3 months [   ] anorexia [  ]; fatigue [  ]; nausea [  ]; night sweats [  ]; fever [  ]; or chills [  ];           Eye : blurred vision [  ]; diplopia [   ]; vision changes [  ];  Amaurosis fugax[  ]; Resp: cough [ y ];  wheezing[y ];  hemoptysis[  ]; shortness of breath[ y ]; paroxysmal nocturnal dyspnea[y  ]; dyspnea on exertion[ y ]; or orthopnea[  ];  GI:  gallstones[  ], vomiting[  ];  dysphagia[  ]; melena[  ];  hematochezia [  ]; heartburn[  ];   Hx of  Colonoscopy[  ];  GU: kidney stones [  ]; hematuria[  ];   dysuria [  ];  nocturia[  ];  history of     obstruction [  ]; urinary frequency [  ]             Skin: rash, swelling[  ];, hair loss[  ];  peripheral edema[ y ];  or itching[  ]; Musculosketetal: myalgias[  ];  joint swelling[  ];  joint erythema[  ];  joint pain[  ];  back pain[  ];  Heme/Lymph: bruising[  ];  bleeding[  ];  anemia[  ];  Neuro: TIA[  ];  headaches[   ];  stroke[  ];  vertigo[  ];  seizures[  ];   paresthesias[  ];  difficulty walking[  ];  Psych:depression[  ]; anxiety[  ];  Endocrine: diabetes[ y ];  thyroid dysfunction[  ];  Immunizations: Flu up to date [ y ]; Pneumococcal up to date [ y ];  Other:     PHYSICAL EXAMINATION: BP 126/68   Pulse 78   Resp 20   Ht 5\' 10"  (1.778 m)   Wt 200 lb (90.7 kg)   SpO2 96% Comment: 2L O2 per [place]  BMI 28.70 kg/m  General appearance: alert, cooperative, appears stated age, no distress and Patient comes in on home oxygen Head: Normocephalic, without obvious abnormality, atraumatic Neck: no adenopathy, no carotid bruit, no JVD, supple, symmetrical, trachea midline and thyroid not enlarged, symmetric, no tenderness/mass/nodules Lymph nodes: Cervical, supraclavicular, and axillary nodes normal. Resp: diminished breath sounds bibasilar Back: symmetric, no curvature. ROM normal. No CVA tenderness. Cardio: regular rate and rhythm, S1, S2 normal, no murmur, click, rub or gallop GI: soft, non-tender; bowel sounds normal; no masses,  no organomegaly Extremities: extremities normal, atraumatic, no cyanosis or edema Neurologic: Grossly normal Palpable DP and PT pulses bilaterally Patient has healed lower abdominal scar from previous colon perforation and incarcerated ventral hernia  Diagnostic Studies & Laboratory data:     Recent Radiology Findings:   Ct Chest Wo Contrast  Result Date: 02/06/2018 CLINICAL DATA:  Increasing shortness of breath. EXAM: CT CHEST WITHOUT CONTRAST TECHNIQUE: Multidetector CT imaging of the chest was performed following the standard protocol without IV contrast. COMPARISON:  Chest radiograph 02/06/2018 FINDINGS: Cardiovascular: Enlarged heart. Minimal pericardial thickening. Calcific atherosclerotic disease of the coronary arteries and aorta. Mediastinum/Nodes: Moderately enlarged bilateral mediastinal lymph nodes at all stations. Index right pretracheal lymph node measures  12 mm in short axis and right supra carinal lymph node measures 14 mm in short axis. Lungs/Pleura: Mild thickening and distortion of the interstitium with upper lobe predominant paraseptal emphysema. Mosaic attenuation of the lung parenchyma. Probable mild interstitial pulmonary edema. No pleural effusions or lobar consolidation. Upper Abdomen: Cholelithiasis. Shotty retroperitoneal and mesenteric lymph nodes, sub pathologic by CT criteria. Atherosclerotic calcifications of the aorta. Musculoskeletal: Spondylosis of the thoracic spine. IMPRESSION: Chronic interstitial lung changes with mosaic attenuation of the lung parenchyma. Differential diagnosis includes obstructive small airway disease, air trapping, or occlusive vascular disease. Emphysema. Mediastinal lymphadenopathy, likely reactive. Enlarged heart with mild interstitial pulmonary edema. Aortic Atherosclerosis (ICD10-I70.0) and Emphysema (ICD10-J43.9). Electronically Signed   By: Ted Mcalpine M.D.   On: 02/06/2018 17:17      I have independently reviewed the above radiology studies  and reviewed the findings with the patient.   Recent Lab Findings: Lab Results  Component Value Date   WBC 7.9 02/06/2018   HGB 11.6 (L) 02/06/2018   HCT 38.3 (L)  02/06/2018   PLT 227 02/06/2018   GLUCOSE 266 (H) 02/08/2018   ALT 12 02/06/2018   AST 16 02/06/2018   NA 138 02/08/2018   K 4.3 02/08/2018   CL 97 (L) 02/08/2018   CREATININE 1.30 (H) 02/08/2018   BUN 37 (H) 02/08/2018   CO2 29 02/08/2018   HGBA1C 6.6 (H) 02/06/2018    Chronic Kidney Disease   Stage I     GFR >90  Stage II    GFR 60-89  Stage IIIA GFR 45-59   Stage IIIB GFR 30-44  Stage IV   GFR 15-29  Stage V    GFR  <15  Lab Results  Component Value Date   CREATININE 1.30 (H) 02/08/2018   Estimated Creatinine Clearance: 53.1 mL/min (A) (by C-G formula based on SCr of 1.3 mg/dL (H)).  Assessment / Plan:    1/ Evidence of cardiomegaly, pulmonary hypertension by  echocardiogram estimated PA pressure 65 with moderate TR and moderate RV dilatation 2/ Chronic interstitial lung changes-with diffuse  defect requiring home oxygen 3/ Chronic kidney disease- Stage IIIA GFR 45-59  4/ Mediastinal lymphadenopathy, likely reactive.  The patient has a appointment with Dr Juanetta Gosling in the near future to address pulmonary issues.  With his evidence on echocardiogram of pulmonary hypertension enlarged right heart mild/moderate tricuspid regurgitation and episodes of anginal type chest pain associated with shortness of breath I have referred him to cardiologist.  He has an appointment with cardiologist in [place] but prefers to come to [facility].  I reviewed the CT scan rhythm he has mild mediastinal adenopathy likely this is reactive with his underlying lung disease.  I recommended to him that we address aggressively his cardiac and pulmonary evaluation.  We will obtain a follow-up CT of the chest in 4 months, if there is progressive enlargement of mediastinal nodes will consider mediastinal node biopsy by endobronchial ultrasound or mediastinoscopy.   Delight Ovens MD      [address] [place] [place] [address] Office [phone]   Beeper [phone]  02/16/2018 4:51 PM

## 2018-02-23 ENCOUNTER — Encounter: Payer: Self-pay | Admitting: Cardiology

## 2018-03-04 ENCOUNTER — Inpatient Hospital Stay (HOSPITAL_COMMUNITY)
Admission: EM | Admit: 2018-03-04 | Discharge: 2018-03-08 | DRG: 291 | Disposition: A | Discharge status: Home / Self Care | Payer: Medicare HMO | Attending: Internal Medicine | Admitting: Internal Medicine

## 2018-03-04 ENCOUNTER — Emergency Department (HOSPITAL_COMMUNITY): Payer: Medicare HMO

## 2018-03-04 ENCOUNTER — Other Ambulatory Visit: Payer: Self-pay

## 2018-03-04 ENCOUNTER — Encounter (HOSPITAL_COMMUNITY): Payer: Self-pay | Admitting: Emergency Medicine

## 2018-03-04 DIAGNOSIS — Z79899 Other long term (current) drug therapy: Secondary | ICD-10-CM | POA: Diagnosis not present

## 2018-03-04 DIAGNOSIS — Z794 Long term (current) use of insulin: Secondary | ICD-10-CM

## 2018-03-04 DIAGNOSIS — I2781 Cor pulmonale (chronic): Secondary | ICD-10-CM | POA: Diagnosis present

## 2018-03-04 DIAGNOSIS — J9621 Acute and chronic respiratory failure with hypoxia: Secondary | ICD-10-CM | POA: Diagnosis present

## 2018-03-04 DIAGNOSIS — I4891 Unspecified atrial fibrillation: Secondary | ICD-10-CM | POA: Diagnosis not present

## 2018-03-04 DIAGNOSIS — E1122 Type 2 diabetes mellitus with diabetic chronic kidney disease: Secondary | ICD-10-CM | POA: Diagnosis present

## 2018-03-04 DIAGNOSIS — R0603 Acute respiratory distress: Secondary | ICD-10-CM

## 2018-03-04 DIAGNOSIS — I130 Hypertensive heart and chronic kidney disease with heart failure and stage 1 through stage 4 chronic kidney disease, or unspecified chronic kidney disease: Secondary | ICD-10-CM | POA: Diagnosis present

## 2018-03-04 DIAGNOSIS — I700 Atherosclerosis of aorta: Secondary | ICD-10-CM | POA: Diagnosis present

## 2018-03-04 DIAGNOSIS — J849 Interstitial pulmonary disease, unspecified: Secondary | ICD-10-CM | POA: Diagnosis present

## 2018-03-04 DIAGNOSIS — E119 Type 2 diabetes mellitus without complications: Secondary | ICD-10-CM | POA: Diagnosis not present

## 2018-03-04 DIAGNOSIS — N183 Chronic kidney disease, stage 3 unspecified: Secondary | ICD-10-CM | POA: Diagnosis present

## 2018-03-04 DIAGNOSIS — J9622 Acute and chronic respiratory failure with hypercapnia: Secondary | ICD-10-CM | POA: Diagnosis present

## 2018-03-04 DIAGNOSIS — I10 Essential (primary) hypertension: Secondary | ICD-10-CM | POA: Diagnosis not present

## 2018-03-04 DIAGNOSIS — I4510 Unspecified right bundle-branch block: Secondary | ICD-10-CM | POA: Diagnosis present

## 2018-03-04 DIAGNOSIS — I5033 Acute on chronic diastolic (congestive) heart failure: Secondary | ICD-10-CM | POA: Diagnosis present

## 2018-03-04 DIAGNOSIS — I214 Non-ST elevation (NSTEMI) myocardial infarction: Secondary | ICD-10-CM

## 2018-03-04 DIAGNOSIS — J9601 Acute respiratory failure with hypoxia: Secondary | ICD-10-CM | POA: Insufficient documentation

## 2018-03-04 DIAGNOSIS — R590 Localized enlarged lymph nodes: Secondary | ICD-10-CM | POA: Diagnosis present

## 2018-03-04 DIAGNOSIS — K219 Gastro-esophageal reflux disease without esophagitis: Secondary | ICD-10-CM | POA: Diagnosis present

## 2018-03-04 DIAGNOSIS — I509 Heart failure, unspecified: Secondary | ICD-10-CM

## 2018-03-04 DIAGNOSIS — E782 Mixed hyperlipidemia: Secondary | ICD-10-CM | POA: Diagnosis present

## 2018-03-04 DIAGNOSIS — Z87891 Personal history of nicotine dependence: Secondary | ICD-10-CM

## 2018-03-04 DIAGNOSIS — Z9981 Dependence on supplemental oxygen: Secondary | ICD-10-CM

## 2018-03-04 DIAGNOSIS — R778 Other specified abnormalities of plasma proteins: Secondary | ICD-10-CM | POA: Diagnosis present

## 2018-03-04 DIAGNOSIS — R748 Abnormal levels of other serum enzymes: Secondary | ICD-10-CM | POA: Diagnosis not present

## 2018-03-04 DIAGNOSIS — Z7982 Long term (current) use of aspirin: Secondary | ICD-10-CM | POA: Diagnosis not present

## 2018-03-04 DIAGNOSIS — R7989 Other specified abnormal findings of blood chemistry: Secondary | ICD-10-CM

## 2018-03-04 DIAGNOSIS — I5082 Biventricular heart failure: Secondary | ICD-10-CM | POA: Diagnosis present

## 2018-03-04 DIAGNOSIS — J441 Chronic obstructive pulmonary disease with (acute) exacerbation: Secondary | ICD-10-CM | POA: Diagnosis present

## 2018-03-04 HISTORY — DX: Essential (primary) hypertension: I10

## 2018-03-04 HISTORY — DX: Gastro-esophageal reflux disease without esophagitis: K21.9

## 2018-03-04 HISTORY — DX: Type 2 diabetes mellitus without complications: E11.9

## 2018-03-04 LAB — COMPREHENSIVE METABOLIC PANEL
ALBUMIN: 3.2 g/dL — AB (ref 3.5–5.0)
ALK PHOS: 52 U/L (ref 38–126)
ALT: 13 U/L (ref 0–44)
ANION GAP: 9 (ref 5–15)
AST: 17 U/L (ref 15–41)
BILIRUBIN TOTAL: 1.3 mg/dL — AB (ref 0.3–1.2)
BUN: 24 mg/dL — AB (ref 8–23)
CO2: 29 mmol/L (ref 22–32)
Calcium: 8.2 mg/dL — ABNORMAL LOW (ref 8.9–10.3)
Chloride: 95 mmol/L — ABNORMAL LOW (ref 98–111)
Creatinine, Ser: 1.41 mg/dL — ABNORMAL HIGH (ref 0.61–1.24)
GFR calc Af Amer: 54 mL/min — ABNORMAL LOW (ref >60)
GFR calc non Af Amer: 46 mL/min — ABNORMAL LOW (ref >60)
GLUCOSE: 187 mg/dL — AB (ref 70–99)
POTASSIUM: 4.6 mmol/L (ref 3.5–5.1)
SODIUM: 133 mmol/L — AB (ref 135–145)
TOTAL PROTEIN: 7.5 g/dL (ref 6.5–8.1)

## 2018-03-04 LAB — GLUCOSE, CAPILLARY: GLUCOSE-CAPILLARY: 153 mg/dL — AB (ref 70–99)

## 2018-03-04 LAB — CBC WITH DIFFERENTIAL/PLATELET
BASOS ABS: 0 10*3/uL (ref 0.0–0.1)
BASOS PCT: 0 %
EOS ABS: 0.2 10*3/uL (ref 0.0–0.7)
Eosinophils Relative: 2 %
HEMATOCRIT: 37.6 % — AB (ref 39.0–52.0)
HEMOGLOBIN: 11.7 g/dL — AB (ref 13.0–17.0)
Lymphocytes Relative: 13 %
Lymphs Abs: 1.1 10*3/uL (ref 0.7–4.0)
MCH: 27.8 pg (ref 26.0–34.0)
MCHC: 31.1 g/dL (ref 30.0–36.0)
MCV: 89.3 fL (ref 78.0–100.0)
Monocytes Absolute: 0.4 10*3/uL (ref 0.1–1.0)
Monocytes Relative: 5 %
NEUTROS ABS: 6.4 10*3/uL (ref 1.7–7.7)
NEUTROS PCT: 80 %
Platelets: 175 10*3/uL (ref 150–400)
RBC: 4.21 MIL/uL — ABNORMAL LOW (ref 4.22–5.81)
RDW: 15 % (ref 11.5–15.5)
WBC: 8.1 10*3/uL (ref 4.0–10.5)

## 2018-03-04 LAB — TROPONIN I
TROPONIN I: 0.16 ng/mL — AB (ref <0.03)
Troponin I: 0.13 ng/mL (ref <0.03)

## 2018-03-04 LAB — BRAIN NATRIURETIC PEPTIDE: B Natriuretic Peptide: 321 pg/mL — ABNORMAL HIGH (ref 0.0–100.0)

## 2018-03-04 MED ORDER — AMLODIPINE BESYLATE 5 MG PO TABS: 5.0000 mg | ORAL_TABLET | Freq: Every day | ORAL | Status: DC

## 2018-03-04 MED ORDER — DOXAZOSIN MESYLATE 2 MG PO TABS: 8.0000 mg | ORAL_TABLET | Freq: Every day | ORAL | Status: DC

## 2018-03-04 MED ORDER — SODIUM CHLORIDE 0.9% FLUSH
3.0000 mL | INTRAVENOUS | Status: DC | PRN
  Administered 2018-03-07 (×2): 3 mL via INTRAVENOUS
  Filled 2018-03-04 (×2): qty 3

## 2018-03-04 MED ORDER — ONDANSETRON HCL 4 MG/2ML IJ SOLN: 4.0000 mg | Freq: Four times a day (QID) | INTRAMUSCULAR | Status: DC | PRN

## 2018-03-04 MED ORDER — PANTOPRAZOLE SODIUM 40 MG PO TBEC
40.0000 mg | DELAYED_RELEASE_TABLET | Freq: Every day | ORAL | Status: DC
  Administered 2018-03-05 – 2018-03-08 (×4): 40 mg via ORAL
  Filled 2018-03-04 (×4): qty 1

## 2018-03-04 MED ORDER — ACETAMINOPHEN 325 MG PO TABS: 650.0000 mg | ORAL_TABLET | ORAL | Status: DC | PRN

## 2018-03-04 MED ORDER — HYDROCODONE-ACETAMINOPHEN 5-325 MG PO TABS
1.0000 | ORAL_TABLET | ORAL | Status: DC | PRN
  Administered 2018-03-05 – 2018-03-07 (×2): 1 via ORAL
  Filled 2018-03-04 (×2): qty 1

## 2018-03-04 MED ORDER — INSULIN ASPART 100 UNIT/ML ~~LOC~~ SOLN
0.0000 [IU] | Freq: Every day | SUBCUTANEOUS | Status: DC
  Administered 2018-03-05 – 2018-03-06 (×2): 2 [IU] via SUBCUTANEOUS
  Administered 2018-03-07: 5 [IU] via SUBCUTANEOUS

## 2018-03-04 MED ORDER — CARVEDILOL 12.5 MG PO TABS
25.0000 mg | ORAL_TABLET | Freq: Two times a day (BID) | ORAL | Status: DC
  Administered 2018-03-04 – 2018-03-08 (×8): 25 mg via ORAL
  Filled 2018-03-04 (×9): qty 2

## 2018-03-04 MED ORDER — SODIUM CHLORIDE 0.9 % IV SOLN
250.0000 mL | INTRAVENOUS | Status: DC | PRN
  Administered 2018-03-07: 250 mL via INTRAVENOUS

## 2018-03-04 MED ORDER — DULOXETINE HCL 60 MG PO CPEP
60.0000 mg | ORAL_CAPSULE | Freq: Every day | ORAL | Status: DC
  Administered 2018-03-05 – 2018-03-08 (×4): 60 mg via ORAL
  Filled 2018-03-04 (×4): qty 1

## 2018-03-04 MED ORDER — INSULIN ASPART 100 UNIT/ML ~~LOC~~ SOLN
0.0000 [IU] | Freq: Three times a day (TID) | SUBCUTANEOUS | Status: DC
  Administered 2018-03-05: 3 [IU] via SUBCUTANEOUS
  Administered 2018-03-05: 5 [IU] via SUBCUTANEOUS
  Administered 2018-03-05: 3 [IU] via SUBCUTANEOUS
  Administered 2018-03-06: 5 [IU] via SUBCUTANEOUS
  Administered 2018-03-06: 3 [IU] via SUBCUTANEOUS
  Administered 2018-03-06: 5 [IU] via SUBCUTANEOUS
  Administered 2018-03-07: 8 [IU] via SUBCUTANEOUS
  Administered 2018-03-07: 3 [IU] via SUBCUTANEOUS
  Administered 2018-03-07: 11 [IU] via SUBCUTANEOUS
  Administered 2018-03-08: 5 [IU] via SUBCUTANEOUS
  Administered 2018-03-08: 8 [IU] via SUBCUTANEOUS

## 2018-03-04 MED ORDER — HEPARIN (PORCINE) IN NACL 100-0.45 UNIT/ML-% IJ SOLN: 1100.0000 [IU]/h | INTRAMUSCULAR | Status: DC

## 2018-03-04 MED ORDER — FUROSEMIDE 10 MG/ML IJ SOLN
40.0000 mg | Freq: Two times a day (BID) | INTRAMUSCULAR | Status: DC
  Administered 2018-03-05 – 2018-03-06 (×3): 40 mg via INTRAVENOUS
  Filled 2018-03-04 (×4): qty 4

## 2018-03-04 MED ORDER — HEPARIN (PORCINE) IN NACL 100-0.45 UNIT/ML-% IJ SOLN
12.0000 [IU]/kg/h | INTRAMUSCULAR | Status: DC
  Administered 2018-03-04: 12 [IU]/kg/h via INTRAVENOUS
  Filled 2018-03-04: qty 250

## 2018-03-04 MED ORDER — FUROSEMIDE 10 MG/ML IJ SOLN
40.0000 mg | INTRAMUSCULAR | Status: AC
  Administered 2018-03-04: 40 mg via INTRAVENOUS
  Filled 2018-03-04: qty 4

## 2018-03-04 MED ORDER — ASPIRIN EC 81 MG PO TBEC
81.0000 mg | DELAYED_RELEASE_TABLET | Freq: Every day | ORAL | Status: DC
  Administered 2018-03-04 – 2018-03-07 (×4): 81 mg via ORAL
  Filled 2018-03-04 (×4): qty 1

## 2018-03-04 MED ORDER — HEPARIN BOLUS VIA INFUSION
4000.0000 [IU] | Freq: Once | INTRAVENOUS | Status: AC
  Administered 2018-03-04: 4000 [IU] via INTRAVENOUS

## 2018-03-04 MED ORDER — SODIUM CHLORIDE 0.9% FLUSH
3.0000 mL | Freq: Two times a day (BID) | INTRAVENOUS | Status: DC
  Administered 2018-03-04 – 2018-03-08 (×7): 3 mL via INTRAVENOUS

## 2018-03-04 MED ORDER — POTASSIUM CHLORIDE CRYS ER 20 MEQ PO TBCR
40.0000 meq | EXTENDED_RELEASE_TABLET | Freq: Two times a day (BID) | ORAL | Status: DC
  Administered 2018-03-04: 40 meq via ORAL
  Filled 2018-03-04: qty 2

## 2018-03-04 MED ORDER — INSULIN DETEMIR 100 UNIT/ML ~~LOC~~ SOLN
35.0000 [IU] | Freq: Every day | SUBCUTANEOUS | Status: DC
  Administered 2018-03-04: 35 [IU] via SUBCUTANEOUS
  Filled 2018-03-04 (×3): qty 0.35

## 2018-03-04 MED ORDER — METFORMIN HCL 500 MG PO TABS: 1000.0000 mg | ORAL_TABLET | Freq: Two times a day (BID) | ORAL | Status: DC

## 2018-03-04 MED ORDER — BUSPIRONE HCL 5 MG PO TABS
10.0000 mg | ORAL_TABLET | Freq: Three times a day (TID) | ORAL | Status: DC
  Administered 2018-03-04 – 2018-03-08 (×12): 10 mg via ORAL
  Filled 2018-03-04 (×12): qty 2

## 2018-03-04 MED ORDER — ATORVASTATIN CALCIUM 40 MG PO TABS
40.0000 mg | ORAL_TABLET | Freq: Every day | ORAL | Status: DC
  Administered 2018-03-04 – 2018-03-08 (×5): 40 mg via ORAL
  Filled 2018-03-04 (×5): qty 1

## 2018-03-04 NOTE — ED Notes (Signed)
Date and time results received: 03/04/18 1934   Test: Troponin Critical Value: 0.16  Name of Provider Notified: Adrian BlackwaterStinson, MD

## 2018-03-04 NOTE — H&P (Addendum)
History and Physical  Mario Walker ZOX:[id number] DOB: [birth year] DOA: 03/04/2018  Referring physician: Dr Hyacinth Meeker, ED physician PCP: Craig Staggers, MD  Outpatient Specialists:   Patient Coming From: home  Chief Complaint: Shortness of breath  HPI: Mario Walker is a 77 y.o. 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  Patient seen for shortness of breath that started over the past few days that is worse with exertion and improved with rest.  His symptoms are worsening where even minimal walking causes him shortness of breath.  No other palliating or provoking factors.  Patient had some mild chest pain when he gets short of breath.  No fevers, chills, nausea, vomiting.  No fevers, chills, coughing.  Patient was just recently admitted to the hospital 3 weeks ago for similar problems.  Emergency Department Course: Patient's initial oxygen saturation were 65% on baseline oxygen.  Required BiPAP to maintain oxygen saturation in the 90s.  Troponin initially 0.14 with a repeat at 0.16.  Checks x-ray shows pulmonary edema  Review of Systems:   Pt denies any fevers, chills, nausea, vomiting, diarrhea, constipation, abdominal pain, palpitations, headache, vision changes, lightheadedness, dizziness, melena, rectal bleeding.  Review of systems are otherwise negative  Past Medical History:  Diagnosis Date  . Acid reflux   . Diabetes mellitus without complication (HCC)   . Hyperlipidemia   . Hypertension    Past Surgical History:  Procedure Laterality Date  . COLON SURGERY    . ROTATOR CUFF REPAIR     Social History:  reports that he has quit smoking. He has never used smokeless tobacco. He reports that he does not drink alcohol or use drugs. Patient lives at home  No Known Allergies  History reviewed. No  pertinent family history.    Prior to Admission medications   Medication Sig Start Date End Date Taking? Authorizing Provider  amLODipine (NORVASC) 10 MG tablet Take 10 mg by mouth daily. 02/24/18  Yes [provider]  amLODipine (NORVASC) 5 MG tablet Take 1 tablet by mouth daily.  03/05/14  Yes [provider]  Ascorbic Acid (VITAMIN C PO) Take 1 tablet by mouth daily.   Yes [provider]  aspirin EC 81 MG tablet Take 81 mg by mouth daily.   Yes [provider]  atorvastatin (LIPITOR) 40 MG tablet Take 1 tablet by mouth daily.    Yes [provider]  busPIRone (BUSPAR) 10 MG tablet Take 10 mg by mouth 3 (three) times daily.   Yes [provider]  carvedilol (COREG) 25 MG tablet Take 1 tablet by mouth 2 (two) times daily. 01/19/14  Yes [provider]  Cholecalciferol (VITAMIN D PO) Take 1 tablet by mouth daily.   Yes [provider]  Cyanocobalamin (VITAMIN B-12 PO) Take 1 tablet by mouth daily.   Yes [provider]  doxazosin (CARDURA) 8 MG tablet Take 1 tablet by mouth daily. 01/19/14  Yes [provider]  DULoxetine (CYMBALTA) 60 MG capsule Take 60 mg by mouth daily.   Yes [provider]  furosemide (LASIX) 40 MG tablet Take 40 mg by mouth 2 (two) times daily.   Yes [provider]  glimepiride (AMARYL) 2 MG tablet Take 1 tablet by mouth daily. 03/05/14  Yes [provider]  insulin aspart (NOVOLOG) 100 UNIT/ML injection Inject into the skin 3 (  three) times daily before meals.   Yes [provider]  LEVEMIR FLEXTOUCH 100 UNIT/ML Pen INJECT 35 UNITS SUBCUTANEOUSLY AT BEDTIME 02/20/18  Yes [provider]  metFORMIN (GLUCOPHAGE) 500 MG tablet Take 1,000 mg by mouth 2 (two) times daily with a meal.  02/09/14  Yes [provider]  multivitamin-iron-minerals-folic acid (CENTRUM) chewable tablet Chew 1 tablet by mouth daily.   Yes [provider]    omeprazole (PRILOSEC) 40 MG capsule Take 1 capsule by mouth daily.  01/19/14  Yes [provider]  VITAMIN A PO Take 1 tablet by mouth daily.   Yes [provider]  HYDROcodone-acetaminophen (NORCO) 5-325 MG per tablet Take 1-2 tablets by mouth every 4 (four) hours as needed. 03/11/14   Blane Ohara, MD  ipratropium-albuterol (DUONEB) 0.5-2.5 (3) MG/3ML SOLN USE 1 AMPULE IN NEBULIZER THREE TIMES DAILY AS NEEDED 02/15/18   [provider]    Physical Exam: BP (!) 142/72   Pulse 91   Temp 97.7 F (36.5 C) (Oral)   Resp (!) 22   Ht 5\' 10"  (1.778 m)   Wt 90.7 kg   SpO2 93%   BMI 28.70 kg/m   . General: Elderly Caucasian male. Awake and alert and oriented x3. No acute cardiopulmonary distress.  Marland Kitchen HEENT: Normocephalic atraumatic.  Right and left ears normal in appearance.  Pupils equal, round, reactive to light. Extraocular muscles are intact. Sclerae anicteric and noninjected.  Moist mucosal membranes. No mucosal lesions.  . Neck: Neck supple without lymphadenopathy. No carotid bruits. No masses palpated.  . Cardiovascular: Regular rate with normal S1-S2 sounds. No murmurs, rubs, gallops auscultated. No JVD.  Marland Kitchen Respiratory: Rales in bases bilaterally.  Currently on BiPAP with no accessory muscle use. . Abdomen: Soft, nontender, nondistended. Active bowel sounds. No masses or hepatosplenomegaly  . Skin: No rashes, lesions, or ulcerations.  Dry, warm to touch. 2+ dorsalis pedis and radial pulses. . Musculoskeletal: No calf or leg pain. All major joints not erythematous nontender.  No upper or lower joint deformation.  Good ROM.  No contractures  . Psychiatric: Intact judgment and insight. Pleasant and cooperative. . Neurologic: No focal neurological deficits. Strength is 5/5 and symmetric in upper and lower extremities.  Cranial nerves II through XII are grossly intact.           Labs on Admission: I have personally reviewed following labs and imaging  studies  CBC: Recent Labs  Lab 03/04/18 1553  WBC 8.1  NEUTROABS 6.4  HGB 11.7*  HCT 37.6*  MCV 89.3  PLT 175   Basic Metabolic Panel: Recent Labs  Lab 03/04/18 1553  NA 133*  K 4.6  CL 95*  CO2 29  GLUCOSE 187*  BUN 24*  CREATININE 1.41*  CALCIUM 8.2*   GFR: Estimated Creatinine Clearance: 48.9 mL/min (A) (by C-G formula based on SCr of 1.41 mg/dL (H)). Liver Function Tests: Recent Labs  Lab 03/04/18 1553  AST 17  ALT 13  ALKPHOS 52  BILITOT 1.3*  PROT 7.5  ALBUMIN 3.2*   No results for input(s): LIPASE, AMYLASE in the last 168 hours. No results for input(s): AMMONIA in the last 168 hours. Coagulation Profile: No results for input(s): INR, PROTIME in the last 168 hours. Cardiac Enzymes: Recent Labs  Lab 03/04/18 1553 03/04/18 1810  TROPONINI 0.13* 0.16*   BNP (last 3 results) No results for input(s): PROBNP in the last 8760 hours. HbA1C: No results for input(s): HGBA1C in the last 72 hours. CBG: No  results for input(s): GLUCAP in the last 168 hours. Lipid Profile: No results for input(s): CHOL, HDL, LDLCALC, TRIG, CHOLHDL, LDLDIRECT in the last 72 hours. Thyroid Function Tests: No results for input(s): TSH, T4TOTAL, FREET4, T3FREE, THYROIDAB in the last 72 hours. Anemia Panel: No results for input(s): VITAMINB12, FOLATE, FERRITIN, TIBC, IRON, RETICCTPCT in the last 72 hours. Urine analysis:    Component Value Date/Time   COLORURINE STRAW (A) 02/06/2018 1415   APPEARANCEUR CLEAR 02/06/2018 1415   LABSPEC 1.006 02/06/2018 1415   PHURINE 7.0 02/06/2018 1415   GLUCOSEU NEGATIVE 02/06/2018 1415   HGBUR NEGATIVE 02/06/2018 1415   BILIRUBINUR NEGATIVE 02/06/2018 1415   KETONESUR NEGATIVE 02/06/2018 1415   PROTEINUR NEGATIVE 02/06/2018 1415   NITRITE NEGATIVE 02/06/2018 1415   LEUKOCYTESUR NEGATIVE 02/06/2018 1415   Sepsis Labs: @LABRCNTIP (procalcitonin:4,lacticidven:4) )No results found for this or any previous visit (from the past 240  hour(s)).   Radiological Exams on Admission: Dg Chest Portable 1 View  Result Date: 03/04/2018 CLINICAL DATA:  Shortness of breath for 2 days EXAM: PORTABLE CHEST 1 VIEW COMPARISON:  02/06/2018 FINDINGS: Cardiac shadow is enlarged but stable. Central vascular congestion is again identified similar to that seen on the prior exam. No focal confluent infiltrate is seen. Mild bibasilar atelectasis is noted. IMPRESSION: Chronic appearing changes stable from the previous exam. Persistent changes of vascular congestion are noted. Electronically Signed   By: Alcide CleverMark  Lukens M.D.   On: 03/04/2018 15:56    EKG: Independently reviewed.  Sinus rhythm.  Right bundle branch block with left anterior fascicular block.  No acute ST changes.  Assessment/Plan: Principal Problem:   Acute on chronic respiratory failure with hypoxia and hypercapnia (HCC) Active Problems:   COPD exacerbation (HCC)   Acute CHF (congestive heart failure) (HCC)   CKD (chronic kidney disease) stage 3, GFR 30-59 ml/min (HCC)   Essential hypertension   Diabetes mellitus without complication (HCC)   Acid reflux   Elevated troponin    This patient was discussed with the ED physician, including pertinent vitals, physical exam findings, labs, and imaging.  We also discussed care given by the ED provider.  1. Acute on chronic respiratory failure with hypoxia and hypercapnia a. Continue BiPAP b. Question possible pulmonary hypertension as contributing factor 2. Acute CHF failure Telemetry monitoring Strict I/O Daily Weights Diuresis: Lasix 40 mg IV twice daily Potassium: 40 mEq twice a day by mouth Echo cardiac exam not indicated as had recent echo 3 weeks ago and repeat will likely not change plan of care Repeat BMP tomorrow 3. COPD a. Currently compensated 4. Elevated troponin a. Question secondary to CHF versus an STEMI b. We will trend this c. Per cardiology, continue heparin until trended  5. Diabetes a. Home regimen with  sliding scale insulin 6. Hypertension a. Continue antihypertensives 7. Acid reflux a. Continue home medication  DVT prophylaxis: Lovenox Consultants: None Code Status: Full code Family Communication: Wife present during interview and exam Disposition Plan: Patient should be able to return home following admission   Noralee CharsStinson, Sharlena Kristensen J, DO [facility] Pager [phone]  If 7PM-7AM, please contact night-coverage [email or web address] Password [email or web address]

## 2018-03-04 NOTE — Progress Notes (Signed)
ANTICOAGULATION CONSULT NOTE - Initial Consult  Pharmacy Consult for Heparin Indication: chest pain/ACS  No Known Allergies  Patient Measurements: Height: 5\' 10"  (177.8 cm) Weight: 200 lb (90.7 kg) IBW/kg (Calculated) : 73 HEPARIN DW (KG): 90.7  Vital Signs: Temp: 97.7 F (36.5 C) (09/13 1455) Temp Source: Oral (09/13 1455) BP: 138/74 (09/13 1800) Pulse Rate: 82 (09/13 1800)  Labs: Recent Labs    03/04/18 1553  HGB 11.7*  HCT 37.6*  PLT 175  CREATININE 1.41*  TROPONINI 0.13*    Estimated Creatinine Clearance: 48.9 mL/min (A) (by C-G formula based on SCr of 1.41 mg/dL (H)).   Medical History: Past Medical History:  Diagnosis Date  . Acid reflux   . Diabetes mellitus without complication (HCC)   . Hyperlipidemia   . Hypertension     Medications:  See med rec  Assessment: 77 yo male presents to ED with SOB. Pt also has elevated troponins, and cardiology recommended heparin. Ok for heparin protocol.  Goal of Therapy:  Heparin level 0.3-0.7 units/ml Monitor platelets by anticoagulation protocol: Yes   Plan:  Give 4000 units bolus x 1 Start heparin infusion at 1100 units/hr Check anti-Xa level in 8 hours hours and daily while on heparin Continue to monitor H&H and platelets   Elder CyphersLorie Charlene Detter, BS Loura BackPharm D, BCPS Clinical Pharmacist Pager [phone] 03/04/2018,6:47 PM

## 2018-03-04 NOTE — ED Triage Notes (Signed)
Patient complaining of shortness of breath x 2 days. Also complaining of chest pain "for a right good while and I told my heart doctor about it."

## 2018-03-04 NOTE — ED Notes (Signed)
Date and time results received: 03/04/18 1636   Test: Troponin  Critical Value: 0.13  Name of Provider Notified: Dr. Hyacinth MeekerMiller  Orders Received? Or Actions Taken?: No new orders given.

## 2018-03-04 NOTE — ED Provider Notes (Signed)
[facility] EMERGENCY DEPARTMENT Provider Note   CSN: [id number] Arrival date & time: 03/04/18  1449     History   Chief Complaint Chief Complaint  Patient presents with  . Shortness of Breath    HPI Mario Walker is a 77 y.o. male.  HPI  The patient is a 77 year old male, he is a known diabetic, has high blood pressure, hyperlipidemia, he has a known history of COPD, on 2 L of oxygen chronically, chronic kidney disease stage III and has acute congestive heart failure with which he was diagnosed last month when he was admitted to the hospital for acute on chronic respiratory failure.  He had an ejection fraction of 55 to 60% by echocardiogram on August 19 of this year, he had diastolic dysfunction grade indeterminate as well.  He takes a fluid pill but noticed over the last several days he has had increased amounts of fluid, increased shortness of breath and has been increasingly dyspneic with even the most minimal walking.  When he arrived to the hospital his oxygen levels were in the 65% range on his baseline oxygen and he required high flow nasal cannula to get him even close to upper 80%.  The patient states he does have a small amount of chest discomfort when he gets super short of breath.  He denies fevers or increased coughing.  Past Medical History:  Diagnosis Date  . Acid reflux   . Diabetes mellitus without complication (HCC)   . Hyperlipidemia   . Hypertension     Patient Active Problem List   Diagnosis Date Noted  . Acute on chronic respiratory failure with hypoxia and hypercapnia (HCC) 02/06/2018  . COPD exacerbation (HCC) 02/06/2018  . Acute CHF (congestive heart failure) (HCC) 02/06/2018  . CKD (chronic kidney disease) stage 3, GFR 30-59 ml/min (HCC) 02/06/2018  . Essential hypertension 02/06/2018    Past Surgical History:  Procedure Laterality Date  . COLON SURGERY    . ROTATOR CUFF REPAIR          Home Medications    Prior to Admission medications     Medication Sig Start Date End Date Taking? Authorizing Provider  amLODipine (NORVASC) 10 MG tablet Take 10 mg by mouth daily. 02/24/18  Yes [provider]  amLODipine (NORVASC) 5 MG tablet Take 1 tablet by mouth daily.  03/05/14  Yes [provider]  Ascorbic Acid (VITAMIN C PO) Take 1 tablet by mouth daily.   Yes [provider]  aspirin EC 81 MG tablet Take 81 mg by mouth daily.   Yes [provider]  atorvastatin (LIPITOR) 40 MG tablet Take 1 tablet by mouth daily.    Yes [provider]  busPIRone (BUSPAR) 10 MG tablet Take 10 mg by mouth 3 (three) times daily.   Yes [provider]  carvedilol (COREG) 25 MG tablet Take 1 tablet by mouth 2 (two) times daily. 01/19/14  Yes [provider]  Cholecalciferol (VITAMIN D PO) Take 1 tablet by mouth daily.   Yes [provider]  Cyanocobalamin (VITAMIN B-12 PO) Take 1 tablet by mouth daily.   Yes [provider]  doxazosin (CARDURA) 8 MG tablet Take 1 tablet by mouth daily. 01/19/14  Yes [provider]  DULoxetine (CYMBALTA) 60 MG capsule Take 60 mg by mouth daily.   Yes [provider]  furosemide (LASIX) 40 MG tablet Take 40 mg by mouth 2 (two) times daily.   Yes [provider]  glimepiride (AMARYL)  2 MG tablet Take 1 tablet by mouth daily. 03/05/14  Yes [provider]  insulin aspart (NOVOLOG) 100 UNIT/ML injection Inject into the skin 3 (three) times daily before meals.   Yes [provider]  LEVEMIR FLEXTOUCH 100 UNIT/ML Pen INJECT 35 UNITS SUBCUTANEOUSLY AT BEDTIME 02/20/18  Yes [provider]  metFORMIN (GLUCOPHAGE) 500 MG tablet Take 1,000 mg by mouth 2 (two) times daily with a meal.  02/09/14  Yes [provider]  multivitamin-iron-minerals-folic acid (CENTRUM) chewable tablet Chew 1 tablet by mouth daily.   Yes [provider]  omeprazole (PRILOSEC) 40 MG capsule Take 1 capsule by mouth daily.   01/19/14  Yes [provider]  VITAMIN A PO Take 1 tablet by mouth daily.   Yes [provider]  HYDROcodone-acetaminophen (NORCO) 5-325 MG per tablet Take 1-2 tablets by mouth every 4 (four) hours as needed. 03/11/14   Blane OharaZavitz, Joshua, MD  ipratropium-albuterol (DUONEB) 0.5-2.5 (3) MG/3ML SOLN USE 1 AMPULE IN NEBULIZER THREE TIMES DAILY AS NEEDED 02/15/18   [provider]  predniSONE (DELTASONE) 20 MG tablet Take 3 tablets (60 mg total) by mouth daily with breakfast. And decrease by one tablet daily Patient not taking: Reported on 03/04/2018 02/09/18   Catarina Hartshornat, David, MD    Family History History reviewed. No pertinent family history.  Social History Social History   Tobacco Use  . Smoking status: Former Games developermoker  . Smokeless tobacco: Never Used  Substance Use Topics  . Alcohol use: No  . Drug use: No     Allergies   Patient has no known allergies.   Review of Systems Review of Systems  All other systems reviewed and are negative.    Physical Exam Updated Vital Signs BP 132/75   Pulse 77   Temp 97.7 F (36.5 C) (Oral)   Resp (!) 24   Ht 1.778 m (5\' 10" )   Wt 90.7 kg   SpO2 91%   BMI 28.70 kg/m   Physical Exam  Constitutional: He appears well-developed and well-nourished. He appears distressed.  HENT:  Head: Normocephalic and atraumatic.  Mouth/Throat: Oropharynx is clear and moist. No oropharyngeal exudate.  Eyes: Pupils are equal, round, and reactive to light. Conjunctivae and EOM are normal. Right eye exhibits no discharge. Left eye exhibits no discharge. No scleral icterus.  Neck: Normal range of motion. Neck supple. No JVD present. No thyromegaly present.  Cardiovascular: Normal rate, regular rhythm, normal heart sounds and intact distal pulses. Exam reveals no gallop and no friction rub.  No murmur heard. Pulmonary/Chest: He is in respiratory distress. He has no wheezes. He has rales.  The patient is tachypnea, respiratory rate of  approximately 26 breaths/min, he has rales in all lung fields best heard at the bases bilaterally, he has no wheezing, he speaks in very short and sentences.  Abdominal: Soft. Bowel sounds are normal. He exhibits no distension and no mass. There is no tenderness.  Musculoskeletal: Normal range of motion. He exhibits edema (1+ symmetrical pitting edema below the knees). He exhibits no tenderness.  Lymphadenopathy:    He has no cervical adenopathy.  Neurological: He is alert. Coordination normal.  Skin: Skin is warm and dry. No rash noted. No erythema.  Psychiatric: He has a normal mood and affect. His behavior is normal.  Nursing note and vitals reviewed.    ED Treatments / Results  Labs (all labs ordered are listed, but only abnormal results are displayed) Labs Reviewed  CBC WITH DIFFERENTIAL/PLATELET - Abnormal;  Notable for the following components:      Result Value   RBC 4.21 (*)    Hemoglobin 11.7 (*)    HCT 37.6 (*)    All other components within normal limits  COMPREHENSIVE METABOLIC PANEL - Abnormal; Notable for the following components:   Sodium 133 (*)    Chloride 95 (*)    Glucose, Bld 187 (*)    BUN 24 (*)    Creatinine, Ser 1.41 (*)    Calcium 8.2 (*)    Albumin 3.2 (*)    Total Bilirubin 1.3 (*)    GFR calc non Af Amer 46 (*)    GFR calc Af Amer 54 (*)    All other components within normal limits  TROPONIN I - Abnormal; Notable for the following components:   Troponin I 0.13 (*)    All other components within normal limits  BRAIN NATRIURETIC PEPTIDE - Abnormal; Notable for the following components:   B Natriuretic Peptide 321.0 (*)    All other components within normal limits    EKG EKG Interpretation  Date/Time:  Friday March 04 2018 15:08:17 EDT Ventricular Rate:  88 PR Interval:    QRS Duration: 151 QT Interval:  416 QTC Calculation: 504 R Axis:   -50 Text Interpretation:  Sinus rhythm Probable left atrial enlargement RBBB and LAFB since last  tracing no significant change Confirmed by Eber Hong ([id number]) on 03/04/2018 3:21:39 PM   Radiology Dg Chest Portable 1 View  Result Date: 03/04/2018 CLINICAL DATA:  Shortness of breath for 2 days EXAM: PORTABLE CHEST 1 VIEW COMPARISON:  02/06/2018 FINDINGS: Cardiac shadow is enlarged but stable. Central vascular congestion is again identified similar to that seen on the prior exam. No focal confluent infiltrate is seen. Mild bibasilar atelectasis is noted. IMPRESSION: Chronic appearing changes stable from the previous exam. Persistent changes of vascular congestion are noted. Electronically Signed   By: Alcide Clever M.D.   On: 03/04/2018 15:56    Procedures .Critical Care Performed by: Eber Hong, MD Authorized by: Eber Hong, MD   Critical care provider statement:    Critical care time (minutes):  35   Critical care time was exclusive of:  Separately billable procedures and treating other patients and teaching time   Critical care was necessary to treat or prevent imminent or life-threatening deterioration of the following conditions:  Cardiac failure   Critical care was time spent personally by me on the following activities:  Blood draw for specimens, development of treatment plan with patient or surrogate, discussions with consultants, evaluation of patient's response to treatment, examination of patient, obtaining history from patient or surrogate, ordering and performing treatments and interventions, ordering and review of laboratory studies, ordering and review of radiographic studies, pulse oximetry, re-evaluation of patient's condition and review of old charts   (including critical care time)  Medications Ordered in ED Medications  heparin ADULT infusion 100 units/mL (25000 units/228mL sodium chloride 0.45%) (12 Units/kg/hr  90.7 kg Intravenous New Bag/Given 03/04/18 1732)  furosemide (LASIX) injection 40 mg (40 mg Intravenous Given 03/04/18 1551)  heparin bolus via infusion  4,000 Units (4,000 Units Intravenous Bolus from Bag 03/04/18 1731)     Initial Impression / Assessment and Plan / ED Course  I have reviewed the triage vital signs and the nursing notes.  Pertinent labs & imaging results that were available during my care of the patient were reviewed by me and considered in my medical decision making (see chart for details).  Clinical  Course as of Mar 04 1757  Fri Mar 04, 2018  1556 I have personally viewed the chest x-ray, there appears to be pulmonary edema bilaterally.  No signs of pneumothorax   [BM]    Clinical Course User Index [BM] Eber Hong, MD    The patient's EKG shows a right bundle branch block, this is consistent with his prior EKG, with peripheral edema, pulmonary rales and hypoxia which is at around 90% on high flow nasal cannula I suspect the patient is fluid overloaded.  He will need Lasix, further evaluation with labs and a chest x-ray, current blood pressure is 139/66, will avoid nitroglycerin for the time being but he will need BiPAP secondary to respiratory distress.  There is no wheezing, no coughing, the patient is critically ill with what appears to be congestive heart failure.  She does not laboratory work-up has been reviewed, he has no leukocytosis, he has some renal insufficiency with a creatinine of 1.4, compared to 1.33 weeks ago.  His troponin is 0.13 compared to -3 weeks ago and his BNP is unchanged at around 325.  Chest x-ray shows ongoing vascular congestion and interstitial edema, not significantly changed.  Medical record was reviewed and showed that the patient did have a CT scan of the chest without contrast which confirmed some interstitial edema and/or lung disease consistent with emphysema.  Will discuss with cardiology  The patient has an elevated troponin, cardiology recommends admission here and further work-up with local testing including cycling cardiac enzymes, will start heparin for elevation in  troponin.  Final Clinical Impressions(s) / ED Diagnoses   Final diagnoses:  NSTEMI (non-ST elevated myocardial infarction) [facility])  Respiratory distress    ED Discharge Orders    None       Eber Hong, MD 03/04/18 1758

## 2018-03-05 DIAGNOSIS — I5033 Acute on chronic diastolic (congestive) heart failure: Secondary | ICD-10-CM

## 2018-03-05 LAB — CBC
HEMATOCRIT: 35.7 % — AB (ref 39.0–52.0)
Hemoglobin: 10.9 g/dL — ABNORMAL LOW (ref 13.0–17.0)
MCH: 27.4 pg (ref 26.0–34.0)
MCHC: 30.5 g/dL (ref 30.0–36.0)
MCV: 89.7 fL (ref 78.0–100.0)
Platelets: 180 10*3/uL (ref 150–400)
RBC: 3.98 MIL/uL — AB (ref 4.22–5.81)
RDW: 15 % (ref 11.5–15.5)
WBC: 8.2 10*3/uL (ref 4.0–10.5)

## 2018-03-05 LAB — BASIC METABOLIC PANEL
ANION GAP: 7 (ref 5–15)
BUN: 22 mg/dL (ref 8–23)
CALCIUM: 8.3 mg/dL — AB (ref 8.9–10.3)
CHLORIDE: 99 mmol/L (ref 98–111)
CO2: 32 mmol/L (ref 22–32)
Creatinine, Ser: 1.28 mg/dL — ABNORMAL HIGH (ref 0.61–1.24)
GFR calc non Af Amer: 52 mL/min — ABNORMAL LOW (ref >60)
Glucose, Bld: 179 mg/dL — ABNORMAL HIGH (ref 70–99)
Potassium: 4.3 mmol/L (ref 3.5–5.1)
Sodium: 138 mmol/L (ref 135–145)

## 2018-03-05 LAB — MRSA PCR SCREENING: MRSA by PCR: NEGATIVE

## 2018-03-05 LAB — TROPONIN I
TROPONIN I: 0.17 ng/mL — AB (ref <0.03)
Troponin I: 0.13 ng/mL (ref <0.03)

## 2018-03-05 LAB — GLUCOSE, CAPILLARY
GLUCOSE-CAPILLARY: 216 mg/dL — AB (ref 70–99)
GLUCOSE-CAPILLARY: 161 mg/dL — AB (ref 70–99)
GLUCOSE-CAPILLARY: 265 mg/dL — AB (ref 70–99)
Glucose-Capillary: 164 mg/dL — ABNORMAL HIGH (ref 70–99)

## 2018-03-05 LAB — HEPARIN LEVEL (UNFRACTIONATED): Heparin Unfractionated: 0.1 IU/mL — ABNORMAL LOW (ref 0.30–0.70)

## 2018-03-05 MED ORDER — ENOXAPARIN SODIUM 40 MG/0.4ML ~~LOC~~ SOLN
40.0000 mg | SUBCUTANEOUS | Status: DC
  Administered 2018-03-05 – 2018-03-06 (×2): 40 mg via SUBCUTANEOUS
  Filled 2018-03-05 (×2): qty 0.4

## 2018-03-05 MED ORDER — INSULIN DETEMIR 100 UNIT/ML ~~LOC~~ SOLN
20.0000 [IU] | Freq: Every day | SUBCUTANEOUS | Status: DC
  Administered 2018-03-05: 20 [IU] via SUBCUTANEOUS
  Filled 2018-03-05 (×2): qty 0.2

## 2018-03-05 MED ORDER — IPRATROPIUM-ALBUTEROL 0.5-2.5 (3) MG/3ML IN SOLN
3.0000 mL | Freq: Three times a day (TID) | RESPIRATORY_TRACT | Status: DC
  Administered 2018-03-05: 3 mL via RESPIRATORY_TRACT
  Filled 2018-03-05: qty 3

## 2018-03-05 MED ORDER — HEPARIN BOLUS VIA INFUSION
2500.0000 [IU] | Freq: Once | INTRAVENOUS | Status: AC
  Administered 2018-03-05: 2500 [IU] via INTRAVENOUS
  Filled 2018-03-05: qty 2500

## 2018-03-05 MED ORDER — HEPARIN (PORCINE) IN NACL 100-0.45 UNIT/ML-% IJ SOLN: 1400.0000 [IU]/h | INTRAMUSCULAR | Status: DC

## 2018-03-05 MED ORDER — BUDESONIDE 0.5 MG/2ML IN SUSP
0.5000 mg | Freq: Two times a day (BID) | RESPIRATORY_TRACT | Status: DC
  Administered 2018-03-05 – 2018-03-08 (×7): 0.5 mg via RESPIRATORY_TRACT
  Filled 2018-03-05 (×7): qty 2

## 2018-03-05 MED ORDER — IPRATROPIUM-ALBUTEROL 0.5-2.5 (3) MG/3ML IN SOLN
3.0000 mL | Freq: Three times a day (TID) | RESPIRATORY_TRACT | Status: DC
  Administered 2018-03-05 – 2018-03-08 (×10): 3 mL via RESPIRATORY_TRACT
  Filled 2018-03-05 (×10): qty 3

## 2018-03-05 MED ORDER — POTASSIUM CHLORIDE CRYS ER 20 MEQ PO TBCR
40.0000 meq | EXTENDED_RELEASE_TABLET | Freq: Every day | ORAL | Status: DC
  Administered 2018-03-05 – 2018-03-07 (×3): 40 meq via ORAL
  Filled 2018-03-05 (×4): qty 2

## 2018-03-05 NOTE — Progress Notes (Signed)
While pt was asleep, noted desats to 80's while on humidified [place] at 5L. Increased to 6L with little to no improvement. Pt denies any complaints of chest pain or shortness of breath. Respiratory notified. Will switch to HFNC.

## 2018-03-05 NOTE — Progress Notes (Signed)
PROGRESS NOTE  Mario LovingsJames R Walker ZOX:[id number] DOB: 08/08/1940 DOA: 03/04/2018 PCP: [name], [name], MD  Brief History:  76 y.o.malewith medical history oftobacco abuse, diabetes mellitus, hypertension, and hyperlipidemia presenting with 3 day history of sob.  Patient states that he has had dyspnea on exertion with minimal activity.  He also notes some orthopnea type symptoms with increasing lower extremity edema.  Patient was recently discharged from the hospital after a stay from 02/06/2018 through 02/08/2018 when he was treated for acute diastolic CHF and COPD exacerbation.  His discharge weight was 191 pounds.  He was discharged home on furosemide 40 mg twice daily.  The patient endorses some dietary indiscretion.  However, he states that he has been compliant with his medications and fluid restriction.  He has noted some chest discomfort associated with the shortness of breath at the time of admission.  He denies fevers, chills, worsening cough, hemoptysis, nausea, vomiting, diarrhea, abdominal pain, dysuria.  The patient has a remote history of tobacco with a total of approximately 30 pack years. He quit smoking 50 years ago.  At baseline, the patient is on 2 L nasal cannula.  However, he has had to increase his oxygen to 4 L the past 2 to 3 days.  Upon admission, chest x-ray showed vascular congestion with BNP 321.  The patient's troponin was initially elevated 0.13.  He was started on IV heparin initially.  He was noted to have oxygen saturation of 65% in the emergency department and was initially placed on BiPAP.   Assessment/Plan: Acute on chronic respiratory failure with hypoxia and hypercarbia -Secondary to acute on chronic diastolic CHF -Patient has been weaned off BiPAP -Currently on 6 L nasal cannula -Wean oxygen back to baseline as tolerated for saturation greater 92%  Acute on chronic diastolic CHF -02/07/2018 echo EF 55-60%, no WMA, moderate TR, PASP 65 -Continue  furosemide 40 mg IV twice daily -Strict I's and O's -Fluid restrict -02/08/18 discharge weight 191 -Admission weight 200 pounds -Patient endorses dietary indiscretion -personally reviewed CXR--increased interstitial markings  Elevated troponin -due to decompensated CHF -trend is flat -personally reviewed EKG--sinus, RBBB unchanged -d/c heparin  COPD -Patient has mild bronchospasm on examination -Start duo nebs and Pulmicort  Diabetes mellitus type 2 -NovoLog sliding scale -Hemoglobin A1c--6.6 -Holding glimepirideand metformin -Lantus 20 units at hs -novolog sliding scale  Essential hypertension -Continue carvedilol -hold amlodipine to allow for BP margin for diuresis  CKD stage III -Baseline creatinine 1.0-1.3 -Monitor with diuresis  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 with reviewed of old medical records -Echo--noted above  Mediastinal lymphadenopathy -02/06/18 CT chest--moderately enlarged mediastinal lymphadenopathy with enlarged right pretracheal and right supra carinal lymph nodes;mild thickening and distortion of the interstitium with upper lobe predominant paraseptal emphysema;mosaic attenuation of the lung parenchyma with possible interstitial edema;shotty retroperitoneal and mesenteric lymphadenopathy -check ANA--neg -check ANCA--neg -outpatient referral to [facility] for mediastinoscopy--pt is informed about CT chest findings    Disposition Plan:   Home in 2-3 days  Family Communication:  No Family at bedside  Consultants:  none  Code Status:  FULL  DVT Prophylaxis:  [place] Heparin / [place] Lovenox   Procedures: As Listed in Progress Note Above  Antibiotics: None    Subjective:   Objective: Vitals:   03/05/18 0113 03/05/18 0200 03/05/18 0300 03/05/18 0400  BP:  (!) 143/75 138/69   Pulse:  92 94   Resp:  (!) 23 (!) 22  Temp:    98.4 F (36.9 C)  TempSrc:    Axillary  SpO2: 94% 94% 95%   Weight:    90.7 kg  Height:         Intake/Output Summary (Last 24 hours) at 03/05/2018 0727 Last data filed at 03/05/2018 0400 Gross per 24 hour  Intake 0 ml  Output 930 ml  Net -930 ml   Weight change:  Exam:   General:  Pt is alert, follows commands appropriately, not in acute distress  HEENT: No icterus, No thrush, No neck mass, [place]/AT  Cardiovascular: RRR, S1/S2, no rubs, no gallops  Respiratory: CTA bilaterally, no wheezing, no crackles, no rhonchi  Abdomen: Soft/+BS, non tender, non distended, no guarding  Extremities: No edema, No lymphangitis, No petechiae, No rashes, no synovitis   Data Reviewed: I have personally reviewed following labs and imaging studies Basic Metabolic Panel: Recent Labs  Lab 03/04/18 1553  NA 133*  K 4.6  CL 95*  CO2 29  GLUCOSE 187*  BUN 24*  CREATININE 1.41*  CALCIUM 8.2*   Liver Function Tests: Recent Labs  Lab 03/04/18 1553  AST 17  ALT 13  ALKPHOS 52  BILITOT 1.3*  PROT 7.5  ALBUMIN 3.2*   No results for input(s): LIPASE, AMYLASE in the last 168 hours. No results for input(s): AMMONIA in the last 168 hours. Coagulation Profile: No results for input(s): INR, PROTIME in the last 168 hours. CBC: Recent Labs  Lab 03/04/18 1553 03/05/18 0658  WBC 8.1 8.2  NEUTROABS 6.4  --   HGB 11.7* 10.9*  HCT 37.6* 35.7*  MCV 89.3 89.7  PLT 175 180   Cardiac Enzymes: Recent Labs  Lab 03/04/18 1553 03/04/18 1810 03/05/18 0116  TROPONINI 0.13* 0.16* 0.13*   BNP: Invalid input(s): POCBNP CBG: Recent Labs  Lab 03/04/18 2124  GLUCAP 153*   HbA1C: No results for input(s): HGBA1C in the last 72 hours. Urine analysis:    Component Value Date/Time   COLORURINE STRAW (A) 02/06/2018 1415   APPEARANCEUR CLEAR 02/06/2018 1415   LABSPEC 1.006 02/06/2018 1415   PHURINE 7.0 02/06/2018 1415   GLUCOSEU NEGATIVE 02/06/2018 1415   HGBUR NEGATIVE 02/06/2018 1415   BILIRUBINUR NEGATIVE 02/06/2018 1415   KETONESUR NEGATIVE 02/06/2018 1415   PROTEINUR NEGATIVE  02/06/2018 1415   NITRITE NEGATIVE 02/06/2018 1415   LEUKOCYTESUR NEGATIVE 02/06/2018 1415   Sepsis Labs: @LABRCNTIP (procalcitonin:4,lacticidven:4) ) Recent Results (from the past 240 hour(s))  MRSA PCR Screening     Status: None   Collection Time: 03/04/18  8:50 PM  Result Value Ref Range Status   MRSA by PCR NEGATIVE NEGATIVE Final    Comment:        The GeneXpert MRSA Assay (FDA approved for NASAL specimens only), is one component of a comprehensive MRSA colonization surveillance program. It is not intended to diagnose MRSA infection nor to guide or monitor treatment for MRSA infections. Performed at [facility], [address] [place], [place] [address]      Scheduled Meds: . amLODipine  5 mg Oral Daily  . aspirin EC  81 mg Oral Daily  . atorvastatin  40 mg Oral Daily  . busPIRone  10 mg Oral TID  . carvedilol  25 mg Oral BID  . doxazosin  8 mg Oral Daily  . DULoxetine  60 mg Oral Daily  . furosemide  40 mg Intravenous BID  . insulin aspart  0-15 Units Subcutaneous TID WC  . insulin aspart  0-5 Units Subcutaneous QHS  .  insulin detemir  35 Units Subcutaneous Q2200  . pantoprazole  40 mg Oral Daily  . potassium chloride  40 mEq Oral BID  . sodium chloride flush  3 mL Intravenous Q12H   Continuous Infusions: . sodium chloride    . heparin 1,400 Units/hr (03/05/18 0400)    Procedures/Studies: Ct Chest Wo Contrast  Result Date: 02/06/2018 CLINICAL DATA:  Increasing shortness of breath. EXAM: CT CHEST WITHOUT CONTRAST TECHNIQUE: Multidetector CT imaging of the chest was performed following the standard protocol without IV contrast. COMPARISON:  Chest radiograph 02/06/2018 FINDINGS: Cardiovascular: Enlarged heart. Minimal pericardial thickening. Calcific atherosclerotic disease of the coronary arteries and aorta. Mediastinum/Nodes: Moderately enlarged bilateral mediastinal lymph nodes at all stations. Index right pretracheal lymph node measures 12 mm in short axis and  right supra carinal lymph node measures 14 mm in short axis. Lungs/Pleura: Mild thickening and distortion of the interstitium with upper lobe predominant paraseptal emphysema. Mosaic attenuation of the lung parenchyma. Probable mild interstitial pulmonary edema. No pleural effusions or lobar consolidation. Upper Abdomen: Cholelithiasis. Shotty retroperitoneal and mesenteric lymph nodes, sub pathologic by CT criteria. Atherosclerotic calcifications of the aorta. Musculoskeletal: Spondylosis of the thoracic spine. IMPRESSION: Chronic interstitial lung changes with mosaic attenuation of the lung parenchyma. Differential diagnosis includes obstructive small airway disease, air trapping, or occlusive vascular disease. Emphysema. Mediastinal lymphadenopathy, likely reactive. Enlarged heart with mild interstitial pulmonary edema. Aortic Atherosclerosis (ICD10-I70.0) and Emphysema (ICD10-J43.9). Electronically Signed   By: Ted Mcalpine M.D.   On: 02/06/2018 17:17   Dg Chest Portable 1 View  Result Date: 03/04/2018 CLINICAL DATA:  Shortness of breath for 2 days EXAM: PORTABLE CHEST 1 VIEW COMPARISON:  02/06/2018 FINDINGS: Cardiac shadow is enlarged but stable. Central vascular congestion is again identified similar to that seen on the prior exam. No focal confluent infiltrate is seen. Mild bibasilar atelectasis is noted. IMPRESSION: Chronic appearing changes stable from the previous exam. Persistent changes of vascular congestion are noted. Electronically Signed   By: Alcide Clever M.D.   On: 03/04/2018 15:56   Dg Chest Port 1 View  Result Date: 02/06/2018 CLINICAL DATA:  Shortness of breath worsening today. EXAM: PORTABLE CHEST 1 VIEW COMPARISON:  None. FINDINGS: Lungs are adequately inflated with mild hazy perihilar bibasilar opacification likely due to mild vascular congestion. Infection or atelectasis in the lung bases is possible. Moderate cardiomegaly. Degenerative change of the spine. IMPRESSION: Mild  perihilar bibasilar opacification suggesting vascular congestion. Atelectasis or infection in the lung bases is possible. Moderate cardiomegaly. Electronically Signed   By: Elberta Fortis M.D.   On: 02/06/2018 13:07    Catarina Hartshorn, DO  [facility] Pager [phone]  If 7PM-7AM, please contact night-coverage [email or web address] Password [email or web address] 03/05/2018, 7:27 AM   LOS: 1 day

## 2018-03-05 NOTE — Progress Notes (Signed)
Pt noted to be desaturating into the 80's on 8L HFNC. Went in to check on pt. Pt noted to be standing and attempting to change into clothes. States he can't stand wearing the gown. Pointed out to pt that he looks SOB and he stated that he is but that "it's only because I was moving." Informed pt that he needs to notify the nurse for assistance.  While resting, pt saturating in mid 90's on 8 L HFNC

## 2018-03-05 NOTE — Progress Notes (Signed)
Pt placed on BIPAP due to low O2 saturation of 85% while on 5L

## 2018-03-05 NOTE — Progress Notes (Signed)
ANTICOAGULATION CONSULT NOTE -Follow up  Pharmacy Consult for Heparin Indication: chest pain/ACS  No Known Allergies  Patient Measurements: Height: 5\' 10"  (177.8 cm) Weight: 196 lb 6.9 oz (89.1 kg) IBW/kg (Calculated) : 73 HEPARIN DW (KG): 89.1  Vital Signs: Temp: 99.6 F (37.6 C) (09/14 0000) Temp Source: Oral (09/14 0000) BP: 138/69 (09/14 0300) Pulse Rate: 94 (09/14 0300)  Labs: Recent Labs    03/04/18 1553 03/04/18 1810 03/05/18 0116 03/05/18 0310  HGB 11.7*  --   --   --   HCT 37.6*  --   --   --   PLT 175  --   --   --   HEPARINUNFRC  --   --   --  0.10*  CREATININE 1.41*  --   --   --   TROPONINI 0.13* 0.16* 0.13*  --     Estimated Creatinine Clearance: 48.5 mL/min (A) (by C-G formula based on SCr of 1.41 mg/dL (H)).   Medical History: Past Medical History:  Diagnosis Date  . Acid reflux   . Diabetes mellitus without complication (HCC)   . Hyperlipidemia   . Hypertension     Medications:  See med rec  Assessment: 77 yo male presented to ED with SOB on 03/04/18. Pt also has elevated troponins, and cardiology recommended heparin. Pharmacy dosing IV heparin per protocol.  Initial 8 hour heparin level is 0.10 on heparin 1100 units/hr. Subtherapeutic level. No issues with infusion, IV site looks good & no bleeding noted per RN's report.  Goal of Therapy:  Heparin level 0.3-0.7 units/ml Monitor platelets by anticoagulation protocol: Yes   Plan:  Give heparin bolus 2500 units IV x1 Increase heparin rate to 1400 units/hr Check 8 hour heparin level Daily heparin level and CBC  Noah Delaineuth Taylinn Brabant, RPh Clinical Pharmacist On call- Pager #[phone] Lorie Poole, BS Pharm D, BCPS 03/05/2018,3:44 AM

## 2018-03-06 LAB — GLUCOSE, CAPILLARY
GLUCOSE-CAPILLARY: 160 mg/dL — AB (ref 70–99)
GLUCOSE-CAPILLARY: 227 mg/dL — AB (ref 70–99)
Glucose-Capillary: 202 mg/dL — ABNORMAL HIGH (ref 70–99)
Glucose-Capillary: 242 mg/dL — ABNORMAL HIGH (ref 70–99)

## 2018-03-06 LAB — CBC
HEMATOCRIT: 34.9 % — AB (ref 39.0–52.0)
HEMOGLOBIN: 10.7 g/dL — AB (ref 13.0–17.0)
MCH: 27.7 pg (ref 26.0–34.0)
MCHC: 30.7 g/dL (ref 30.0–36.0)
MCV: 90.4 fL (ref 78.0–100.0)
Platelets: 188 10*3/uL (ref 150–400)
RBC: 3.86 MIL/uL — ABNORMAL LOW (ref 4.22–5.81)
RDW: 14.9 % (ref 11.5–15.5)
WBC: 6.9 10*3/uL (ref 4.0–10.5)

## 2018-03-06 LAB — BASIC METABOLIC PANEL
Anion gap: 7 (ref 5–15)
BUN: 23 mg/dL (ref 8–23)
CALCIUM: 8.6 mg/dL — AB (ref 8.9–10.3)
CHLORIDE: 99 mmol/L (ref 98–111)
CO2: 33 mmol/L — AB (ref 22–32)
CREATININE: 1.23 mg/dL (ref 0.61–1.24)
GFR calc Af Amer: >60 mL/min (ref >60)
GFR calc non Af Amer: 54 mL/min — ABNORMAL LOW (ref >60)
Glucose, Bld: 150 mg/dL — ABNORMAL HIGH (ref 70–99)
Potassium: 4 mmol/L (ref 3.5–5.1)
Sodium: 139 mmol/L (ref 135–145)

## 2018-03-06 MED ORDER — FUROSEMIDE 10 MG/ML IJ SOLN
60.0000 mg | Freq: Two times a day (BID) | INTRAMUSCULAR | Status: DC
  Administered 2018-03-06 – 2018-03-07 (×2): 60 mg via INTRAVENOUS
  Filled 2018-03-06 (×2): qty 6

## 2018-03-06 MED ORDER — INSULIN DETEMIR 100 UNIT/ML ~~LOC~~ SOLN
25.0000 [IU] | Freq: Every day | SUBCUTANEOUS | Status: DC
  Administered 2018-03-06: 25 [IU] via SUBCUTANEOUS
  Filled 2018-03-06 (×2): qty 0.25

## 2018-03-06 MED ORDER — POLYETHYLENE GLYCOL 3350 17 G PO PACK
17.0000 g | PACK | Freq: Every day | ORAL | Status: DC
  Administered 2018-03-06 – 2018-03-08 (×3): 17 g via ORAL
  Filled 2018-03-06 (×3): qty 1

## 2018-03-06 MED ORDER — SENNA 8.6 MG PO TABS
2.0000 | ORAL_TABLET | Freq: Every day | ORAL | Status: DC
  Administered 2018-03-06 – 2018-03-08 (×3): 17.2 mg via ORAL
  Filled 2018-03-06 (×3): qty 2

## 2018-03-06 NOTE — Progress Notes (Signed)
PROGRESS NOTE  Mario Walker ZOX:[id number] DOB: 1940-10-12 DOA: 03/04/2018 PCP: Craig Staggers, MD Brief History:  77 y.o.malewith medical history oftobacco abuse, diabetes mellitus, hypertension, and hyperlipidemia presenting with 3 day history of sob.  Patient states that he has had dyspnea on exertion with minimal activity.  He also notes some orthopnea type symptoms with increasing lower extremity edema.  Patient was recently discharged from the hospital after a stay from 02/06/2018 through 02/08/2018 when he was treated for acute diastolic CHF and COPD exacerbation.  His discharge weight was 191 pounds.  He was discharged home on furosemide 40 mg twice daily.  The patient endorses some dietary indiscretion.  However, he states that he has been compliant with his medications and fluid restriction.  He has noted some chest discomfort associated with the shortness of breath at the time of admission.  He denies fevers, chills, worsening cough, hemoptysis, nausea, vomiting, diarrhea, abdominal pain, dysuria.  The patient has a remote history of tobacco with a total of approximately 30 pack years. He quit smoking 50 years ago.  At baseline, the patient is on 2 L nasal cannula.  However, he has had to increase his oxygen to 4 L the past 2 to 3 days.  Upon admission, chest x-ray showed vascular congestion with BNP 321.  The patient's troponin was initially elevated 0.13.  He was started on IV heparin initially.  He was noted to have oxygen saturation of 65% in the emergency department and was initially placed on BiPAP.   Assessment/Plan: Acute on chronic respiratory failure with hypoxia and hypercarbia -Secondary to acute on chronic diastolic CHF -Patient has been weaned off BiPAP -Currently on 6 L nasal cannula>>>2L -Weaned oxygen back to baseline as tolerated for saturation greater 92%  Acute on chronic diastolic CHF -02/07/2018 echo EF 55-60%, no WMA, moderate TR, PASP  65 -Increase furosemide 60 mg IV twice daily -Strict I's and O's -Fluid restrict -02/08/18 discharge weight 191 -Admission weight 200 pounds -Patient endorses dietary indiscretion -personally reviewed CXR--increased interstitial markings  Elevated troponin -due to decompensated CHF -trend is flat -personally reviewed EKG--sinus, RBBB unchanged -d/c heparin  COPD -Patient has mild bronchospasm on examination -Start duo nebs and Pulmicort  Diabetes mellitus type 2 -NovoLog sliding scale -02/06/18 Hemoglobin A1c--6.6 -Holding glimepirideand metformin -Increase Lantus 25 units at hs -novolog sliding scale  Essential hypertension -Continue carvedilol -hold amlodipine to allow for BP margin for diuresis  CKD stage III -Baseline creatinine 1.0-1.3 -Monitor with diuresis  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 with reviewed of old medical records -Echo--noted above  Mediastinal lymphadenopathy -02/06/18 CT chest--moderately enlarged mediastinal lymphadenopathy with enlarged right pretracheal and right supra carinal lymph nodes;mild thickening and distortion of the interstitium with upper lobe predominant paraseptal emphysema;mosaic attenuation of the lung parenchyma with possible interstitial edema;shotty retroperitoneal and mesenteric lymphadenopathy -check ANA--neg -check ANCA--neg -outpatient referral to [facility]for mediastinoscopy--pt is informed about CT chest findings    Disposition Plan:   Home 9/16 or 9/17 Family Communication:  significant other updated at bedside 9/15  Consultants:  none  Code Status:  FULL  DVT Prophylaxis: Warren Lovenox   Procedures: As Listed in Progress Note Above  Antibiotics: None     Subjective: Pt is breathing better on IV lasix, but still has dyspnea with mild exertion.  Denies cp, n/v/d, abd pain.  No f/c hemoptysis  Objective: Vitals:   03/05/18 2123 03/06/18 0512 03/06/18 0808 03/06/18  1409  BP: (!) 161/74 131/72  123/86  Pulse: 91 87  78  Resp: 20 20  20   Temp: 97.6 F (36.4 C) 98.2 F (36.8 C)  97.6 F (36.4 C)  TempSrc: Oral Oral  Oral  SpO2: 92% 93% 96% 98%  Weight:  89.6 kg    Height:        Intake/Output Summary (Last 24 hours) at 03/06/2018 1624 Last data filed at 03/06/2018 0800 Gross per 24 hour  Intake 840 ml  Output 1450 ml  Net -610 ml   Weight change: -1.12 kg Exam:   General:  Pt is alert, follows commands appropriately, not in acute distress  HEENT: No icterus, No thrush, No neck mass, [place]/AT  Cardiovascular: RRR, S1/S2, no rubs, no gallops  Respiratory: bibasilar crackles, no wheeze  Abdomen: Soft/+BS, non tender, non distended, no guarding  Extremities: 1 + LE edema, No lymphangitis, No petechiae, No rashes, no synovitis   Data Reviewed: I have personally reviewed following labs and imaging studies Basic Metabolic Panel: Recent Labs  Lab 03/04/18 1553 03/05/18 0658 03/06/18 0635  NA 133* 138 139  K 4.6 4.3 4.0  CL 95* 99 99  CO2 29 32 33*  GLUCOSE 187* 179* 150*  BUN 24* 22 23  CREATININE 1.41* 1.28* 1.23  CALCIUM 8.2* 8.3* 8.6*   Liver Function Tests: Recent Labs  Lab 03/04/18 1553  AST 17  ALT 13  ALKPHOS 52  BILITOT 1.3*  PROT 7.5  ALBUMIN 3.2*   No results for input(s): LIPASE, AMYLASE in the last 168 hours. No results for input(s): AMMONIA in the last 168 hours. Coagulation Profile: No results for input(s): INR, PROTIME in the last 168 hours. CBC: Recent Labs  Lab 03/04/18 1553 03/05/18 0658 03/06/18 0635  WBC 8.1 8.2 6.9  NEUTROABS 6.4  --   --   HGB 11.7* 10.9* 10.7*  HCT 37.6* 35.7* 34.9*  MCV 89.3 89.7 90.4  PLT 175 180 188   Cardiac Enzymes: Recent Labs  Lab 03/04/18 1553 03/04/18 1810 03/05/18 0116 03/05/18 0658  TROPONINI 0.13* 0.16* 0.13* 0.17*   BNP: Invalid input(s): POCBNP CBG: Recent Labs  Lab 03/05/18 1604 03/05/18 2122 03/06/18 0736 03/06/18 1105 03/06/18 1620   GLUCAP 161* 265* 160* 202* 242*   HbA1C: No results for input(s): HGBA1C in the last 72 hours. Urine analysis:    Component Value Date/Time   COLORURINE STRAW (A) 02/06/2018 1415   APPEARANCEUR CLEAR 02/06/2018 1415   LABSPEC 1.006 02/06/2018 1415   PHURINE 7.0 02/06/2018 1415   GLUCOSEU NEGATIVE 02/06/2018 1415   HGBUR NEGATIVE 02/06/2018 1415   BILIRUBINUR NEGATIVE 02/06/2018 1415   KETONESUR NEGATIVE 02/06/2018 1415   PROTEINUR NEGATIVE 02/06/2018 1415   NITRITE NEGATIVE 02/06/2018 1415   LEUKOCYTESUR NEGATIVE 02/06/2018 1415   Sepsis Labs: @LABRCNTIP (procalcitonin:4,lacticidven:4) ) Recent Results (from the past 240 hour(s))  MRSA PCR Screening     Status: None   Collection Time: 03/04/18  8:50 PM  Result Value Ref Range Status   MRSA by PCR NEGATIVE NEGATIVE Final    Comment:        The GeneXpert MRSA Assay (FDA approved for NASAL specimens only), is one component of a comprehensive MRSA colonization surveillance program. It is not intended to diagnose MRSA infection nor to guide or monitor treatment for MRSA infections. Performed at [facility], [address], [place], [place] [address]      Scheduled Meds: . aspirin EC  81 mg Oral Daily  . atorvastatin  40  mg Oral Daily  . budesonide (PULMICORT) nebulizer solution  0.5 mg Nebulization BID  . busPIRone  10 mg Oral TID  . carvedilol  25 mg Oral BID  . DULoxetine  60 mg Oral Daily  . enoxaparin (LOVENOX) injection  40 mg Subcutaneous Q24H  . furosemide  40 mg Intravenous BID  . insulin aspart  0-15 Units Subcutaneous TID WC  . insulin aspart  0-5 Units Subcutaneous QHS  . insulin detemir  20 Units Subcutaneous Q2200  . ipratropium-albuterol  3 mL Nebulization TID  . pantoprazole  40 mg Oral Daily  . potassium chloride  40 mEq Oral Daily  . sodium chloride flush  3 mL Intravenous Q12H   Continuous Infusions: . sodium chloride      Procedures/Studies: Ct Chest Wo Contrast  Result Date:  02/06/2018 CLINICAL DATA:  Increasing shortness of breath. EXAM: CT CHEST WITHOUT CONTRAST TECHNIQUE: Multidetector CT imaging of the chest was performed following the standard protocol without IV contrast. COMPARISON:  Chest radiograph 02/06/2018 FINDINGS: Cardiovascular: Enlarged heart. Minimal pericardial thickening. Calcific atherosclerotic disease of the coronary arteries and aorta. Mediastinum/Nodes: Moderately enlarged bilateral mediastinal lymph nodes at all stations. Index right pretracheal lymph node measures 12 mm in short axis and right supra carinal lymph node measures 14 mm in short axis. Lungs/Pleura: Mild thickening and distortion of the interstitium with upper lobe predominant paraseptal emphysema. Mosaic attenuation of the lung parenchyma. Probable mild interstitial pulmonary edema. No pleural effusions or lobar consolidation. Upper Abdomen: Cholelithiasis. Shotty retroperitoneal and mesenteric lymph nodes, sub pathologic by CT criteria. Atherosclerotic calcifications of the aorta. Musculoskeletal: Spondylosis of the thoracic spine. IMPRESSION: Chronic interstitial lung changes with mosaic attenuation of the lung parenchyma. Differential diagnosis includes obstructive small airway disease, air trapping, or occlusive vascular disease. Emphysema. Mediastinal lymphadenopathy, likely reactive. Enlarged heart with mild interstitial pulmonary edema. Aortic Atherosclerosis (ICD10-I70.0) and Emphysema (ICD10-J43.9). Electronically Signed   By: Ted Mcalpineobrinka  Dimitrova M.D.   On: 02/06/2018 17:17   Dg Chest Portable 1 View  Result Date: 03/04/2018 CLINICAL DATA:  Shortness of breath for 2 days EXAM: PORTABLE CHEST 1 VIEW COMPARISON:  02/06/2018 FINDINGS: Cardiac shadow is enlarged but stable. Central vascular congestion is again identified similar to that seen on the prior exam. No focal confluent infiltrate is seen. Mild bibasilar atelectasis is noted. IMPRESSION: Chronic appearing changes stable from the  previous exam. Persistent changes of vascular congestion are noted. Electronically Signed   By: Alcide CleverMark  Lukens M.D.   On: 03/04/2018 15:56   Dg Chest Port 1 View  Result Date: 02/06/2018 CLINICAL DATA:  Shortness of breath worsening today. EXAM: PORTABLE CHEST 1 VIEW COMPARISON:  None. FINDINGS: Lungs are adequately inflated with mild hazy perihilar bibasilar opacification likely due to mild vascular congestion. Infection or atelectasis in the lung bases is possible. Moderate cardiomegaly. Degenerative change of the spine. IMPRESSION: Mild perihilar bibasilar opacification suggesting vascular congestion. Atelectasis or infection in the lung bases is possible. Moderate cardiomegaly. Electronically Signed   By: Elberta Fortisaniel  Boyle M.D.   On: 02/06/2018 13:07    Catarina Hartshornavid Leara Rawl, DO  [facility] Pager [phone]  If 7PM-7AM, please contact night-coverage [email or web address] Password [email or web address] 03/06/2018, 4:24 PM   LOS: 2 days

## 2018-03-07 ENCOUNTER — Encounter (HOSPITAL_COMMUNITY): Payer: Self-pay | Admitting: Cardiology

## 2018-03-07 ENCOUNTER — Other Ambulatory Visit (HOSPITAL_COMMUNITY): Payer: Self-pay | Admitting: Respiratory Therapy

## 2018-03-07 DIAGNOSIS — I4891 Unspecified atrial fibrillation: Secondary | ICD-10-CM

## 2018-03-07 DIAGNOSIS — R0602 Shortness of breath: Secondary | ICD-10-CM

## 2018-03-07 LAB — BASIC METABOLIC PANEL
Anion gap: 10 (ref 5–15)
BUN: 26 mg/dL — ABNORMAL HIGH (ref 8–23)
CO2: 31 mmol/L (ref 22–32)
CREATININE: 1.47 mg/dL — AB (ref 0.61–1.24)
Calcium: 8.9 mg/dL (ref 8.9–10.3)
Chloride: 96 mmol/L — ABNORMAL LOW (ref 98–111)
GFR calc non Af Amer: 44 mL/min — ABNORMAL LOW (ref >60)
GFR, EST AFRICAN AMERICAN: 51 mL/min — AB (ref >60)
Glucose, Bld: 207 mg/dL — ABNORMAL HIGH (ref 70–99)
POTASSIUM: 4.1 mmol/L (ref 3.5–5.1)
SODIUM: 137 mmol/L (ref 135–145)

## 2018-03-07 LAB — PROTIME-INR
INR: 1.09
Prothrombin Time: 14 seconds (ref 11.4–15.2)

## 2018-03-07 LAB — GLUCOSE, CAPILLARY
GLUCOSE-CAPILLARY: 183 mg/dL — AB (ref 70–99)
GLUCOSE-CAPILLARY: 312 mg/dL — AB (ref 70–99)
Glucose-Capillary: 268 mg/dL — ABNORMAL HIGH (ref 70–99)
Glucose-Capillary: 356 mg/dL — ABNORMAL HIGH (ref 70–99)

## 2018-03-07 LAB — MAGNESIUM: MAGNESIUM: 1.5 mg/dL — AB (ref 1.7–2.4)

## 2018-03-07 MED ORDER — HEPARIN BOLUS VIA INFUSION
4000.0000 [IU] | Freq: Once | INTRAVENOUS | Status: DC
  Filled 2018-03-07: qty 4000

## 2018-03-07 MED ORDER — FUROSEMIDE 40 MG PO TABS: 40.0000 mg | ORAL_TABLET | Freq: Two times a day (BID) | ORAL | Status: DC

## 2018-03-07 MED ORDER — DILTIAZEM HCL 30 MG PO TABS
30.0000 mg | ORAL_TABLET | Freq: Four times a day (QID) | ORAL | Status: DC
  Administered 2018-03-07 – 2018-03-08 (×5): 30 mg via ORAL
  Filled 2018-03-07 (×5): qty 1

## 2018-03-07 MED ORDER — APIXABAN 5 MG PO TABS
5.0000 mg | ORAL_TABLET | Freq: Two times a day (BID) | ORAL | Status: DC
  Administered 2018-03-07 – 2018-03-08 (×3): 5 mg via ORAL
  Filled 2018-03-07 (×3): qty 1

## 2018-03-07 MED ORDER — SILDENAFIL CITRATE 20 MG PO TABS
20.0000 mg | ORAL_TABLET | Freq: Three times a day (TID) | ORAL | Status: DC
  Administered 2018-03-07 – 2018-03-08 (×5): 20 mg via ORAL
  Filled 2018-03-07 (×5): qty 1

## 2018-03-07 MED ORDER — INSULIN ASPART 100 UNIT/ML ~~LOC~~ SOLN
3.0000 [IU] | Freq: Three times a day (TID) | SUBCUTANEOUS | Status: DC
  Administered 2018-03-07 – 2018-03-08 (×4): 3 [IU] via SUBCUTANEOUS

## 2018-03-07 MED ORDER — SALINE SPRAY 0.65 % NA SOLN
1.0000 | NASAL | Status: DC | PRN
  Administered 2018-03-07: 1 via NASAL
  Filled 2018-03-07: qty 44

## 2018-03-07 MED ORDER — MAGNESIUM SULFATE 2 GM/50ML IV SOLN
2.0000 g | Freq: Once | INTRAVENOUS | Status: AC
  Administered 2018-03-07: 2 g via INTRAVENOUS
  Filled 2018-03-07: qty 50

## 2018-03-07 MED ORDER — FUROSEMIDE 40 MG PO TABS
40.0000 mg | ORAL_TABLET | Freq: Every day | ORAL | Status: DC
  Administered 2018-03-08: 40 mg via ORAL
  Filled 2018-03-07: qty 1

## 2018-03-07 MED ORDER — HEPARIN (PORCINE) IN NACL 100-0.45 UNIT/ML-% IJ SOLN: 1400.0000 [IU]/h | INTRAMUSCULAR | Status: DC

## 2018-03-07 MED ORDER — PREDNISONE 20 MG PO TABS
40.0000 mg | ORAL_TABLET | Freq: Every day | ORAL | Status: DC
  Administered 2018-03-07 – 2018-03-08 (×2): 40 mg via ORAL
  Filled 2018-03-07 (×2): qty 2

## 2018-03-07 MED ORDER — INSULIN DETEMIR 100 UNIT/ML ~~LOC~~ SOLN
30.0000 [IU] | Freq: Every day | SUBCUTANEOUS | Status: DC
  Administered 2018-03-07: 30 [IU] via SUBCUTANEOUS
  Filled 2018-03-07 (×2): qty 0.3

## 2018-03-07 NOTE — Consult Note (Signed)
Consult requested by: [facility] hospital[facility], Dr. Arbutus Leas Consult requested for: Respiratory failure  HPI: This is a 77 year old who was in the hospital last month with combination of acute diastolic heart failure exacerbation and COPD exacerbation.  He had done well at home initially then developed increasing shortness of breath with increased oxygen requirements swelling of his legs and orthopnea.  At baseline he has diabetes hypertension hyperlipidemia.  He had CT scan during his last hospitalization which I have personally reviewed and this shows what appears to be some interstitial lung disease plus what I think is probably reactive mediastinal lymph node enlargement.  He says he smoked but stopped about 50 years ago but does have a 30-pack-year smoking history.  He has reflux but no history of aspiration.  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  He is not aware of any family history of lung disease or interstitial lung disease in particular.  He had echocardiogram last visit that showed pulmonary artery hypertension and mildly to moderately dilated right ventricle.  He was referred to thoracic surgery after last visit but is not felt to need any sort of biopsy at this point.  I agree with that assessment.  He denies any chest pain hemoptysis nausea vomiting diarrhea or urinary symptoms.  Past Medical History:  Diagnosis Date  . Acid reflux   . Diabetes mellitus without complication (HCC)   . Hyperlipidemia   . Hypertension      History reviewed. No pertinent family history.   Social History   Socioeconomic History  . Marital status: Widowed    Spouse name: Not on file  . Number of children: Not on file  . Years of education: Not on file  . Highest education level: Not on file  Occupational History  . Not on file  Social Needs  . Financial resource strain: Not on file  . Food  insecurity:    Worry: Not on file    Inability: Not on file  . Transportation needs:    Medical: Not on file    Non-medical: Not on file  Tobacco Use  . Smoking status: Former Games developer  . Smokeless tobacco: Never Used  Substance and Sexual Activity  . Alcohol use: No  . Drug use: No  . Sexual activity: Not on file  Lifestyle  . Physical activity:    Days per week: Not on file    Minutes per session: Not on file  . Stress: Not on file  Relationships  . Social connections:    Talks on phone: Not on file    Gets together: Not on file    Attends religious service: Not on file    Active member of club or organization: Not on file    Attends meetings of clubs or organizations: Not on file    Relationship status: Not on file  Other Topics Concern  . Not on file  Social History Narrative  . Not on file     ROS: Except as mentioned 10 point review of systems is negative    Objective: Vital signs in last 24 hours: Temp:  [97.6 F (36.4 C)-98.7 F (37.1 C)] 98.7 F (37.1 C) (09/16 0556) Pulse Rate:  [72-84] 72 (09/16 0556) Resp:  [20] 20 (09/15 2119) BP: (108-153)/(52-86) 108/52 (09/16 0556) SpO2:  [92 %-98 %] 92 % (09/16 0744) Weight:  [88.3 kg] 88.3 kg (09/16 0556) Weight change: -1.3  kg Last BM Date: 03/04/18  Intake/Output from previous day: 09/15 0701 - 09/16 0700 In: 720 [P.O.:720] Out: 1800 [Urine:1800]  PHYSICAL EXAM Constitutional: He is awake and alert and in no acute distress.  Eyes: Pupils react EOMI.  Ears nose mouth and throat: His mucous membranes are moist.  His hearing is grossly normal.  Cardiovascular: His heart is regular.  No abnormal heart sounds.  Respiratory: He has some crackles bilaterally.  Gastrointestinal: His abdomen is soft with no masses.  Skin: Warm and dry.  Musculoskeletal: Normal muscular strength upper and lower extremities bilaterally neurological: No focal abnormalities.  Psychiatric: Normal mood and affect  Lab Results: Basic  Metabolic Panel: Recent Labs    03/06/18 0635 03/07/18 0518  NA 139 137  K 4.0 4.1  CL 99 96*  CO2 33* 31  GLUCOSE 150* 207*  BUN 23 26*  CREATININE 1.23 1.47*  CALCIUM 8.6* 8.9  MG  --  1.5*   Liver Function Tests: Recent Labs    03/04/18 1553  AST 17  ALT 13  ALKPHOS 52  BILITOT 1.3*  PROT 7.5  ALBUMIN 3.2*   No results for input(s): LIPASE, AMYLASE in the last 72 hours. No results for input(s): AMMONIA in the last 72 hours. CBC: Recent Labs    03/04/18 1553 03/05/18 0658 03/06/18 0635  WBC 8.1 8.2 6.9  NEUTROABS 6.4  --   --   HGB 11.7* 10.9* 10.7*  HCT 37.6* 35.7* 34.9*  MCV 89.3 89.7 90.4  PLT 175 180 188   Cardiac Enzymes: Recent Labs    03/04/18 1810 03/05/18 0116 03/05/18 0658  TROPONINI 0.16* 0.13* 0.17*   BNP: No results for input(s): PROBNP in the last 72 hours. D-Dimer: No results for input(s): DDIMER in the last 72 hours. CBG: Recent Labs    03/05/18 2122 03/06/18 0736 03/06/18 1105 03/06/18 1620 03/06/18 2144 03/07/18 0753  GLUCAP 265* 160* 202* 242* 227* 183*   Hemoglobin A1C: No results for input(s): HGBA1C in the last 72 hours. Fasting Lipid Panel: No results for input(s): CHOL, HDL, LDLCALC, TRIG, CHOLHDL, LDLDIRECT in the last 72 hours. Thyroid Function Tests: No results for input(s): TSH, T4TOTAL, FREET4, T3FREE, THYROIDAB in the last 72 hours. Anemia Panel: No results for input(s): VITAMINB12, FOLATE, FERRITIN, TIBC, IRON, RETICCTPCT in the last 72 hours. Coagulation: No results for input(s): LABPROT, INR in the last 72 hours. Urine Drug Screen: Drugs of Abuse  No results found for: LABOPIA, COCAINSCRNUR, LABBENZ, AMPHETMU, THCU, LABBARB  Alcohol Level: No results for input(s): ETH in the last 72 hours. Urinalysis: No results for input(s): COLORURINE, LABSPEC, PHURINE, GLUCOSEU, HGBUR, BILIRUBINUR, KETONESUR, PROTEINUR, UROBILINOGEN, NITRITE, LEUKOCYTESUR in the last 72 hours.  Invalid input(s): APPERANCEUR Misc.  Labs:   ABGS: No results for input(s): PHART, PO2ART, TCO2, HCO3 in the last 72 hours.  Invalid input(s): PCO2   MICROBIOLOGY: Recent Results (from the past 240 hour(s))  MRSA PCR Screening     Status: None   Collection Time: 03/04/18  8:50 PM  Result Value Ref Range Status   MRSA by PCR NEGATIVE NEGATIVE Final    Comment:        The GeneXpert MRSA Assay (FDA approved for NASAL specimens only), is one component of a comprehensive MRSA colonization surveillance program. It is not intended to diagnose MRSA infection nor to guide or monitor treatment for MRSA infections. Performed at [facility], [address] [place], [place] [address]     Studies/Results: No results found.  Medications:  Prior to Admission:  Medications Prior to Admission  Medication Sig Dispense Refill Last Dose  . amLODipine (NORVASC) 10 MG tablet Take 10 mg by mouth daily.  1 03/04/2018 at Unknown time  . amLODipine (NORVASC) 5 MG tablet Take 1 tablet by mouth daily.    03/04/2018 at Unknown time  . Ascorbic Acid (VITAMIN C PO) Take 1 tablet by mouth daily.   03/04/2018 at Unknown time  . aspirin EC 81 MG tablet Take 81 mg by mouth daily.   03/04/2018 at Unknown time  . atorvastatin (LIPITOR) 40 MG tablet Take 1 tablet by mouth daily.    03/03/2018 at Unknown time  . busPIRone (BUSPAR) 10 MG tablet Take 10 mg by mouth 3 (three) times daily.   03/04/2018 at Unknown time  . carvedilol (COREG) 25 MG tablet Take 1 tablet by mouth 2 (two) times daily.   03/04/2018 at Unknown time  . Cholecalciferol (VITAMIN D PO) Take 1 tablet by mouth daily.   03/04/2018 at Unknown time  . Cyanocobalamin (VITAMIN B-12 PO) Take 1 tablet by mouth daily.   03/04/2018 at Unknown time  . doxazosin (CARDURA) 8 MG tablet Take 1 tablet by mouth daily.   03/04/2018 at Unknown time  . DULoxetine (CYMBALTA) 60 MG capsule Take 60 mg by mouth daily.   03/04/2018 at Unknown time  . furosemide (LASIX) 40 MG tablet Take 40 mg by mouth 2 (two)  times daily.   03/04/2018 at Unknown time  . glimepiride (AMARYL) 2 MG tablet Take 1 tablet by mouth daily.   03/04/2018 at Unknown time  . insulin aspart (NOVOLOG) 100 UNIT/ML injection Inject into the skin 3 (three) times daily before meals.   03/03/2018 at Unknown time  . LEVEMIR FLEXTOUCH 100 UNIT/ML Pen INJECT 35 UNITS SUBCUTANEOUSLY AT BEDTIME  3 03/03/2018 at Unknown time  . metFORMIN (GLUCOPHAGE) 500 MG tablet Take 1,000 mg by mouth 2 (two) times daily with a meal.    03/04/2018 at Unknown time  . multivitamin-iron-minerals-folic acid (CENTRUM) chewable tablet Chew 1 tablet by mouth daily.   03/04/2018 at Unknown time  . omeprazole (PRILOSEC) 40 MG capsule Take 1 capsule by mouth daily.    03/04/2018 at Unknown time  . VITAMIN A PO Take 1 tablet by mouth daily.   03/04/2018 at Unknown time  . HYDROcodone-acetaminophen (NORCO) 5-325 MG per tablet Take 1-2 tablets by mouth every 4 (four) hours as needed. 6 tablet 0 unknown  . ipratropium-albuterol (DUONEB) 0.5-2.5 (3) MG/3ML SOLN USE 1 AMPULE IN NEBULIZER THREE TIMES DAILY AS NEEDED  1    Scheduled: . aspirin EC  81 mg Oral Daily  . atorvastatin  40 mg Oral Daily  . budesonide (PULMICORT) nebulizer solution  0.5 mg Nebulization BID  . busPIRone  10 mg Oral TID  . carvedilol  25 mg Oral BID  . DULoxetine  60 mg Oral Daily  . enoxaparin (LOVENOX) injection  40 mg Subcutaneous Q24H  . furosemide  60 mg Intravenous BID  . insulin aspart  0-15 Units Subcutaneous TID WC  . insulin aspart  0-5 Units Subcutaneous QHS  . insulin detemir  25 Units Subcutaneous Q2200  . ipratropium-albuterol  3 mL Nebulization TID  . pantoprazole  40 mg Oral Daily  . polyethylene glycol  17 g Oral Daily  . potassium chloride  40 mEq Oral Daily  . senna  2 tablet Oral Daily  . sodium chloride flush  3 mL Intravenous Q12H   Continuous: . sodium chloride  ZOX:WRUEAV chloride, acetaminophen, HYDROcodone-acetaminophen, ondansetron (ZOFRAN) IV, sodium chloride  flush  Assesment: He was admitted with acute on chronic hypoxic and hypercapnic respiratory failure which is likely multifactorial.  He had some element of acute on chronic diastolic heart failure, COPD exacerbation plus he has a pulmonary hypertension by echocardiogram and interstitial pulmonary disease by CT. Principal Problem:   Acute on chronic respiratory failure with hypoxia and hypercapnia (HCC) Active Problems:   COPD exacerbation (HCC)   Acute CHF (congestive heart failure) (HCC)   CKD (chronic kidney disease) stage 3, GFR 30-59 ml/min (HCC)   Essential hypertension   Diabetes mellitus without complication (HCC)   Acid reflux   Elevated troponin   Acute on chronic diastolic CHF (congestive heart failure) (HCC)    Plan: Because of the pulmonary hypertension I think is reasonable to start him on sildenafil while he is waiting for his full cardiac work-up.  He needs pulmonary function testing as an outpatient.  He said he responded very well to prednisone.  He has an appointment to see me as an outpatient and I think if he is discharged it is reasonable to send him home on a prednisone taper and then leave him on 10 mg of prednisone until he comes to see me at which point that can be reassessed.  I would send him home on Trelegy if his insurance will pay for it for COPD.  Thanks for allowing me to see him with you    LOS: 3 days   Mario Walker L 03/07/2018, 8:28 AM

## 2018-03-07 NOTE — Progress Notes (Addendum)
ANTICOAGULATION CONSULT NOTE - Initial Consult  Pharmacy Consult for Heparin Indication: atrial fibrillation  No Known Allergies  Patient Measurements: Height: 5\' 10"  (177.8 cm) Weight: 194 lb 10.7 oz (88.3 kg) IBW/kg (Calculated) : 73 HEPARIN DW (KG): 89.1  Vital Signs: Temp: 98.7 F (37.1 C) (09/16 0556) Temp Source: Oral (09/16 0556) BP: 108/52 (09/16 0556) Pulse Rate: 72 (09/16 0556)  Labs: Recent Labs    03/04/18 1553 03/04/18 1810 03/05/18 0116 03/05/18 0310 03/05/18 0658 03/06/18 0635 03/07/18 0518  HGB 11.7*  --   --   --  10.9* 10.7*  --   HCT 37.6*  --   --   --  35.7* 34.9*  --   PLT 175  --   --   --  180 188  --   HEPARINUNFRC  --   --   --  0.10*  --   --   --   CREATININE 1.41*  --   --   --  1.28* 1.23 1.47*  TROPONINI 0.13* 0.16* 0.13*  --  0.17*  --   --     Estimated Creatinine Clearance: 46.3 mL/min (A) (by C-G formula based on SCr of 1.47 mg/dL (H)).   Medical History: Past Medical History:  Diagnosis Date  . Acid reflux   . Diabetes mellitus without complication (HCC)   . Hyperlipidemia   . Hypertension     Medications:  Medications Prior to Admission  Medication Sig Dispense Refill Last Dose  . amLODipine (NORVASC) 10 MG tablet Take 10 mg by mouth daily.  1 03/04/2018 at Unknown time  . amLODipine (NORVASC) 5 MG tablet Take 1 tablet by mouth daily.    03/04/2018 at Unknown time  . Ascorbic Acid (VITAMIN C PO) Take 1 tablet by mouth daily.   03/04/2018 at Unknown time  . aspirin EC 81 MG tablet Take 81 mg by mouth daily.   03/04/2018 at Unknown time  . atorvastatin (LIPITOR) 40 MG tablet Take 1 tablet by mouth daily.    03/03/2018 at Unknown time  . busPIRone (BUSPAR) 10 MG tablet Take 10 mg by mouth 3 (three) times daily.   03/04/2018 at Unknown time  . carvedilol (COREG) 25 MG tablet Take 1 tablet by mouth 2 (two) times daily.   03/04/2018 at Unknown time  . Cholecalciferol (VITAMIN D PO) Take 1 tablet by mouth daily.   03/04/2018 at Unknown  time  . Cyanocobalamin (VITAMIN B-12 PO) Take 1 tablet by mouth daily.   03/04/2018 at Unknown time  . doxazosin (CARDURA) 8 MG tablet Take 1 tablet by mouth daily.   03/04/2018 at Unknown time  . DULoxetine (CYMBALTA) 60 MG capsule Take 60 mg by mouth daily.   03/04/2018 at Unknown time  . furosemide (LASIX) 40 MG tablet Take 40 mg by mouth 2 (two) times daily.   03/04/2018 at Unknown time  . glimepiride (AMARYL) 2 MG tablet Take 1 tablet by mouth daily.   03/04/2018 at Unknown time  . insulin aspart (NOVOLOG) 100 UNIT/ML injection Inject into the skin 3 (three) times daily before meals.   03/03/2018 at Unknown time  . LEVEMIR FLEXTOUCH 100 UNIT/ML Pen INJECT 35 UNITS SUBCUTANEOUSLY AT BEDTIME  3 03/03/2018 at Unknown time  . metFORMIN (GLUCOPHAGE) 500 MG tablet Take 1,000 mg by mouth 2 (two) times daily with a meal.    03/04/2018 at Unknown time  . multivitamin-iron-minerals-folic acid (CENTRUM) chewable tablet Chew 1 tablet by mouth daily.   03/04/2018 at Unknown time  .  omeprazole (PRILOSEC) 40 MG capsule Take 1 capsule by mouth daily.    03/04/2018 at Unknown time  . VITAMIN A PO Take 1 tablet by mouth daily.   03/04/2018 at Unknown time  . HYDROcodone-acetaminophen (NORCO) 5-325 MG per tablet Take 1-2 tablets by mouth every 4 (four) hours as needed. 6 tablet 0 unknown  . ipratropium-albuterol (DUONEB) 0.5-2.5 (3) MG/3ML SOLN USE 1 AMPULE IN NEBULIZER THREE TIMES DAILY AS NEEDED  1     Assessment: 77 y.o.malepresented to ED with SOB and chest discomfort. HE has medical history oftobacco abuse, diabetes mellitus, hypertension, and hyperlipidemia presenting with3 day history of sob. Has lower extremity edema. He has not been compliant with medications. In addition to acute on chronic D CHF and respiratory failure he has new onset atrial fibrillation and pharmacy has been asked to start heparin. (On admission was started on heparin, initial levels low--> will restart now based on previous  infusion)  Goal of Therapy:  Heparin level 0.3-0.7 units/ml Monitor platelets by anticoagulation protocol: Yes   Plan:  Give 4000 units bolus x 1 Start heparin infusion at 1400 units/hr Check anti-Xa level in 6-8 hours and daily while on heparin Continue to monitor H&H and platelets  Mario Walker, BS Loura Back, BCPS Clinical Pharmacist Pager [phone] 03/07/2018,10:32 AM

## 2018-03-07 NOTE — Progress Notes (Signed)
PROGRESS NOTE  Mario Walker WJX:[id number] DOB: 1941-04-10 DOA: 03/04/2018 PCP: Craig Staggers, MD  Brief History: 77 y.o.malewith medical history oftobacco abuse, diabetes mellitus, hypertension, and hyperlipidemia presenting with3 day history of sob.Patient states that he has had dyspnea on exertion with minimal activity. He also notes some orthopnea type symptoms with increasing lower extremity edema. Patient was recently discharged from the hospital after a stay from 02/06/2018 through 02/08/2018 when he was treated for acute diastolic CHF and COPD exacerbation. His discharge weight was 191 pounds. He was discharged home on furosemide 40 mg twice daily. The patient endorses some dietary indiscretion. However, he states that he has been compliant with his medications and fluid restriction. He has noted some chest discomfort associated with the shortness of breath at the time of admission. He denies fevers, chills, worsening cough, hemoptysis, nausea, vomiting, diarrhea, abdominal pain, dysuria. The patient has a remote history of tobacco with a total of approximately 30 pack years. He quit smoking 50 years ago. At baseline, the patient is on 2 L nasal cannula. However, he has had to increase his oxygen to 4 L the past 2 to 3 days. Upon admission, chest x-ray showed vascular congestion with BNP 321. The patient's troponin was initially elevated 0.13. He was started on IV heparin initially. He was noted to have oxygen saturation of 65% in the emergency department and was initially placed on BiPAP.   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 -02/07/2018 echo EF 55-60%, no WMA, moderate TR,  PASP 65 -furosemide 60 mg IV twice daily>>>po lasix -Strict I's and O's -Fluid restrict -02/08/18 discharge weight 191 -Admission weight 200 pounds -NEG 5.4 lbs since admission -Patient endorses dietary indiscretion -?tachycardia induced decompensation, now with Afib -personally reviewed CXR--increased interstitial markings -may ultimately need right heart cath  New onset Atrial Fibrillation, type unspecified -03/07/18 early am--telemetry showed Afib -CHADSVASc = 6 (CHF, HTN, Age, DM, ASVD) -start IV heparin -cardiology consult -start diltiazem  Elevated troponin -due to decompensated CHF -trend is flat -personally reviewed EKG--sinus, RBBB unchanged -d/c heparin  COPD -Patient has mild bronchospasm on examination -Start duo nebs and Pulmicort -Dr. Juanetta Gosling consulted per patient request  Diabetes mellitus type 2 -NovoLog sliding scale -02/06/18 Hemoglobin A1c--6.6 -Holding glimepirideand metformin -Increase Lantus 30 units at hs -add novolog 3 units with meals -novolog sliding scale  Essential hypertension -Continue carvedilol -hold amlodipine to allow for BP margin for diuresis  CKD stage III -Baseline creatinine1.1-1.4 -Monitor with diuresis  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 with reviewed of old medical records -Echo--noted above  Mediastinal lymphadenopathy -02/06/18 CT chest--moderately enlarged mediastinal lymphadenopathy with enlarged right pretracheal and right supra carinal lymph nodes;mild thickening and distortion of the interstitium with upper lobe predominant paraseptal emphysema;mosaic attenuation of the lung parenchyma with possible interstitial edema;shotty retroperitoneal and mesenteric lymphadenopathy -check ANA--neg -check ANCA--neg -saw Dr. Tyrone Sage on 8/28-->follow up CT chest in 4 months  Hypomagnesemia -replete -recheck mag    Disposition Plan: Not stable for d/c Family Communication:significant  other updated at bedside 9/15  Consultants:none  Code Status: FULL  DVT Prophylaxis: [place] Lovenox   Procedures: As Listed in Progress Note Above  Antibiotics: None   Subjective: Pt states  he is breathing better, but still has some dyspnea on exertion.  Denies cp, n/v/d.  Denies palpitations, HA. Dizziness. C/o nonproductive cough  Objective: Vitals:   03/06/18 2119 03/07/18 0556 03/07/18 0743 03/07/18 0744  BP: (!) 153/74 (!) 108/52    Pulse: 84 72    Resp: 20     Temp: 98 F (36.7 C) 98.7 F (37.1 C)    TempSrc: Oral Oral    SpO2: 95% 95% 92% 92%  Weight:  88.3 kg    Height:        Intake/Output Summary (Last 24 hours) at 03/07/2018 1003 Last data filed at 03/07/2018 0900 Gross per 24 hour  Intake 720 ml  Output 1800 ml  Net -1080 ml   Weight change: -1.3 kg Exam:   General:  Pt is alert, follows commands appropriately, not in acute distress  HEENT: No icterus, No thrush, No neck mass, [place]/AT  Cardiovascular: IRRR, S1/S2, no rubs, no gallops  Respiratory: bibasilar crackles, no wheeze  Abdomen: Soft/+BS, non tender, non distended, no guarding  Extremities: No edema, No lymphangitis, No petechiae, No rashes, no synovitis   Data Reviewed: I have personally reviewed following labs and imaging studies Basic Metabolic Panel: Recent Labs  Lab 03/04/18 1553 03/05/18 0658 03/06/18 0635 03/07/18 0518  NA 133* 138 139 137  K 4.6 4.3 4.0 4.1  CL 95* 99 99 96*  CO2 29 32 33* 31  GLUCOSE 187* 179* 150* 207*  BUN 24* 22 23 26*  CREATININE 1.41* 1.28* 1.23 1.47*  CALCIUM 8.2* 8.3* 8.6* 8.9  MG  --   --   --  1.5*   Liver Function Tests: Recent Labs  Lab 03/04/18 1553  AST 17  ALT 13  ALKPHOS 52  BILITOT 1.3*  PROT 7.5  ALBUMIN 3.2*   No results for input(s): LIPASE, AMYLASE in the last 168 hours. No results for input(s): AMMONIA in the last 168 hours. Coagulation Profile: No results for input(s): INR, PROTIME in the last 168  hours. CBC: Recent Labs  Lab 03/04/18 1553 03/05/18 0658 03/06/18 0635  WBC 8.1 8.2 6.9  NEUTROABS 6.4  --   --   HGB 11.7* 10.9* 10.7*  HCT 37.6* 35.7* 34.9*  MCV 89.3 89.7 90.4  PLT 175 180 188   Cardiac Enzymes: Recent Labs  Lab 03/04/18 1553 03/04/18 1810 03/05/18 0116 03/05/18 0658  TROPONINI 0.13* 0.16* 0.13* 0.17*   BNP: Invalid input(s): POCBNP CBG: Recent Labs  Lab 03/06/18 0736 03/06/18 1105 03/06/18 1620 03/06/18 2144 03/07/18 0753  GLUCAP 160* 202* 242* 227* 183*   HbA1C: No results for input(s): HGBA1C in the last 72 hours. Urine analysis:    Component Value Date/Time   COLORURINE STRAW (A) 02/06/2018 1415   APPEARANCEUR CLEAR 02/06/2018 1415   LABSPEC 1.006 02/06/2018 1415   PHURINE 7.0 02/06/2018 1415   GLUCOSEU NEGATIVE 02/06/2018 1415   HGBUR NEGATIVE 02/06/2018 1415   BILIRUBINUR NEGATIVE 02/06/2018 1415   KETONESUR NEGATIVE 02/06/2018 1415   PROTEINUR NEGATIVE 02/06/2018 1415   NITRITE NEGATIVE 02/06/2018 1415   LEUKOCYTESUR NEGATIVE 02/06/2018 1415   Sepsis Labs: @LABRCNTIP (procalcitonin:4,lacticidven:4) ) Recent Results (from the past 240 hour(s))  MRSA PCR Screening     Status: None   Collection Time: 03/04/18  8:50 PM  Result Value Ref Range Status   MRSA by PCR NEGATIVE NEGATIVE Final    Comment:        The GeneXpert MRSA Assay (FDA approved for NASAL specimens only), is one component of a  comprehensive MRSA colonization surveillance program. It is not intended to diagnose MRSA infection nor to guide or monitor treatment for MRSA infections. Performed at [facility], [address], [place], [place] [address]      Scheduled Meds: . aspirin EC  81 mg Oral Daily  . atorvastatin  40 mg Oral Daily  . budesonide (PULMICORT) nebulizer solution  0.5 mg Nebulization BID  . busPIRone  10 mg Oral TID  . carvedilol  25 mg Oral BID  . diltiazem  30 mg Oral Q6H  . DULoxetine  60 mg Oral Daily  . enoxaparin (LOVENOX)  injection  40 mg Subcutaneous Q24H  . insulin aspart  0-15 Units Subcutaneous TID WC  . insulin aspart  0-5 Units Subcutaneous QHS  . insulin detemir  25 Units Subcutaneous Q2200  . ipratropium-albuterol  3 mL Nebulization TID  . pantoprazole  40 mg Oral Daily  . polyethylene glycol  17 g Oral Daily  . potassium chloride  40 mEq Oral Daily  . predniSONE  40 mg Oral Q breakfast  . senna  2 tablet Oral Daily  . sildenafil  20 mg Oral TID  . sodium chloride flush  3 mL Intravenous Q12H   Continuous Infusions: . sodium chloride      Procedures/Studies: Ct Chest Wo Contrast  Result Date: 02/06/2018 CLINICAL DATA:  Increasing shortness of breath. EXAM: CT CHEST WITHOUT CONTRAST TECHNIQUE: Multidetector CT imaging of the chest was performed following the standard protocol without IV contrast. COMPARISON:  Chest radiograph 02/06/2018 FINDINGS: Cardiovascular: Enlarged heart. Minimal pericardial thickening. Calcific atherosclerotic disease of the coronary arteries and aorta. Mediastinum/Nodes: Moderately enlarged bilateral mediastinal lymph nodes at all stations. Index right pretracheal lymph node measures 12 mm in short axis and right supra carinal lymph node measures 14 mm in short axis. Lungs/Pleura: Mild thickening and distortion of the interstitium with upper lobe predominant paraseptal emphysema. Mosaic attenuation of the lung parenchyma. Probable mild interstitial pulmonary edema. No pleural effusions or lobar consolidation. Upper Abdomen: Cholelithiasis. Shotty retroperitoneal and mesenteric lymph nodes, sub pathologic by CT criteria. Atherosclerotic calcifications of the aorta. Musculoskeletal: Spondylosis of the thoracic spine. IMPRESSION: Chronic interstitial lung changes with mosaic attenuation of the lung parenchyma. Differential diagnosis includes obstructive small airway disease, air trapping, or occlusive vascular disease. Emphysema. Mediastinal lymphadenopathy, likely reactive. Enlarged  heart with mild interstitial pulmonary edema. Aortic Atherosclerosis (ICD10-I70.0) and Emphysema (ICD10-J43.9). Electronically Signed   By: Ted Mcalpine M.D.   On: 02/06/2018 17:17   Dg Chest Portable 1 View  Result Date: 03/04/2018 CLINICAL DATA:  Shortness of breath for 2 days EXAM: PORTABLE CHEST 1 VIEW COMPARISON:  02/06/2018 FINDINGS: Cardiac shadow is enlarged but stable. Central vascular congestion is again identified similar to that seen on the prior exam. No focal confluent infiltrate is seen. Mild bibasilar atelectasis is noted. IMPRESSION: Chronic appearing changes stable from the previous exam. Persistent changes of vascular congestion are noted. Electronically Signed   By: Alcide Clever M.D.   On: 03/04/2018 15:56   Dg Chest Port 1 View  Result Date: 02/06/2018 CLINICAL DATA:  Shortness of breath worsening today. EXAM: PORTABLE CHEST 1 VIEW COMPARISON:  None. FINDINGS: Lungs are adequately inflated with mild hazy perihilar bibasilar opacification likely due to mild vascular congestion. Infection or atelectasis in the lung bases is possible. Moderate cardiomegaly. Degenerative change of the spine. IMPRESSION: Mild perihilar bibasilar opacification suggesting vascular congestion. Atelectasis or infection in the lung bases is possible. Moderate cardiomegaly. Electronically Signed   By: Reuel Boom  Micheline Maze M.D.   On: 02/06/2018 13:07    Catarina Hartshorn, DO  [facility] Pager [phone]  If 7PM-7AM, please contact night-coverage [email or web address] Password [email or web address] 03/07/2018, 10:03 AM   LOS: 3 days

## 2018-03-07 NOTE — Plan of Care (Signed)
Nutrition Education Note  RD consulted for nutrition education regarding onset CHF.  RD provided "Heart Failure Nutrition Therapy" handout from the Academy of [employer]. Reviewed patient's dietary recall. Provided examples on ways to decrease sodium intake in diet. Discouraged intake of processed foods and use of salt shaker. Encouraged fresh fruits and vegetables as well as whole grain sources of carbohydrates to maximize fiber intake.   RD discussed why it is important for patient to adhere to diet recommendations, and emphasized the role of fluids, foods to avoid, and importance of weighing self daily. Teach back method used.  Expect good compliance. No barriers identified.  Body mass index is 27.93 kg/m. Pt meets criteria for overweight based on current BMI.  Current diet order is Heart Healthy/CHO modified, patient is consuming approximately 100% of most meals at this time.   Labs reviewed.  BMP Latest Ref Rng & Units 03/07/2018 03/06/2018 03/05/2018  Glucose 70 - 99 mg/dL 161(W207(H) 960(A150(H) 540(J179(H)  BUN 8 - 23 mg/dL 81(X26(H) 23 22  Creatinine 0.61 - 1.24 mg/dL 9.14(N1.47(H) 8.291.23 5.62(Z1.28(H)  Sodium 135 - 145 mmol/L 137 139 138  Potassium 3.5 - 5.1 mmol/L 4.1 4.0 4.3  Chloride 98 - 111 mmol/L 96(L) 99 99  CO2 22 - 32 mmol/L 31 33(H) 32  Calcium 8.9 - 10.3 mg/dL 8.9 3.0(Q8.6(L) 8.3(L)    Medications: Lasix, Potasium, Insulin, Lipitor   No further nutrition interventions warranted at this time. RD contact information provided. If additional nutrition issues arise, please re-consult RD.   Mario ShiversLynn Carmine Youngberg MS,RD,CSG,LDN Office: [phone] Pager: [phone]

## 2018-03-07 NOTE — Consult Note (Signed)
Cardiology Consultation:   Patient ID: Mario Walker; [id number]; 01-19-1941   Admit date: 03/04/2018 Date of Consult: 03/07/2018  Primary Care Provider: Craig Staggers, MD Primary Cardiologist: New to [facility]   Patient Profile:   Mario Walker is a 77 y.o. male with a history of hypertension, hyperlipidemia, type 2 diabetes mellitus, and GERD who is being seen today for the evaluation of newly documented atrial fibrillation at the request of Tat.  History of Present Illness:   Mario Walker is currently admitted with recent shortness of breath and suspected COPD exacerbation as well as interstitial lung disease.  He has also been managed for diastolic heart failure symptoms.  He has been evaluated by Dr. Juanetta Gosling with plan for PFTs and treatment of pulmonary hypertension.  Recent echocardiogram showed LVEF 55 to 60% with increased filling pressures, also right ventricular dilatation and dysfunction, and moderate tricuspid regurgitation with with PASP 65 mmHg.   Chest CT from August of this year showed interstitial changes and mosaic pattern with mediastinal lymphadenopathy that is potentially reactive.  Also interstitial edema and aortic atherosclerosis with emphysematous changes.  During hospital evaluation troponin I levels were found to be abnormal, although from 0.11-0.17, flat pattern not suggestive of ACS.  BNP mildly increased at 321.  He has been in sinus rhythm predominantly with a burst of atrial fibrillation noted in the last 48 hours, but now is persisting in atrial fibrillation with mildly increased heart rates.  At baseline he is on Toprol-XL and was recently started on short acting diltiazem per primary team.  A symptom perspective he states that his breathing is been better overall, no active chest pain.  Feels a mild sense of palpitations.  CHADSVASC score is 5.  He was placed on heparin by the primary team.  Past Medical History:  Diagnosis Date  . Essential hypertension     . GERD (gastroesophageal reflux disease)   . Hyperlipidemia   . Type 2 diabetes mellitus (HCC)     Past Surgical History:  Procedure Laterality Date  . COLON SURGERY    . ROTATOR CUFF REPAIR       Inpatient Medications: Scheduled Meds: . apixaban  5 mg Oral BID  . atorvastatin  40 mg Oral Daily  . budesonide (PULMICORT) nebulizer solution  0.5 mg Nebulization BID  . busPIRone  10 mg Oral TID  . carvedilol  25 mg Oral BID  . diltiazem  30 mg Oral Q6H  . DULoxetine  60 mg Oral Daily  . furosemide  40 mg Oral BID  . insulin aspart  0-15 Units Subcutaneous TID WC  . insulin aspart  0-5 Units Subcutaneous QHS  . insulin aspart  3 Units Subcutaneous TID WC  . insulin detemir  30 Units Subcutaneous Q2200  . ipratropium-albuterol  3 mL Nebulization TID  . pantoprazole  40 mg Oral Daily  . polyethylene glycol  17 g Oral Daily  . potassium chloride  40 mEq Oral Daily  . predniSONE  40 mg Oral Q breakfast  . senna  2 tablet Oral Daily  . sildenafil  20 mg Oral TID  . sodium chloride flush  3 mL Intravenous Q12H   Continuous Infusions: . sodium chloride 250 mL (03/07/18 1058)  . magnesium sulfate 1 - 4 g bolus IVPB 2 g (03/07/18 1100)   PRN Meds: sodium chloride, acetaminophen, HYDROcodone-acetaminophen, ondansetron (ZOFRAN) IV, sodium chloride, sodium chloride flush  Allergies:   No Known Allergies  Social History:   Social History  Socioeconomic History  . Marital status: Widowed    Spouse name: Not on file  . Number of children: Not on file  . Years of education: Not on file  . Highest education level: Not on file  Occupational History  . Not on file  Social Needs  . Financial resource strain: Not on file  . Food insecurity:    Worry: Not on file    Inability: Not on file  . Transportation needs:    Medical: Not on file    Non-medical: Not on file  Tobacco Use  . Smoking status: Former Smoker    Types: Cigarettes  . Smokeless tobacco: Never Used  Substance  and Sexual Activity  . Alcohol use: No  . Drug use: No  . Sexual activity: Not on file  Lifestyle  . Physical activity:    Days per week: Not on file    Minutes per session: Not on file  . Stress: Not on file  Relationships  . Social connections:    Talks on phone: Not on file    Gets together: Not on file    Attends religious service: Not on file    Active member of club or organization: Not on file    Attends meetings of clubs or organizations: Not on file    Relationship status: Not on file  . Intimate partner violence:    Fear of current or ex partner: Not on file    Emotionally abused: Not on file    Physically abused: Not on file    Forced sexual activity: Not on file  Other Topics Concern  . Not on file  Social History Narrative  . Not on file    Family History:   The patient's family history includes Hypertension in his father.  ROS:  Please see the history of present illness.  All other ROS reviewed and negative.     Physical Exam/Data:   Vitals:   03/07/18 0743 03/07/18 0744 03/07/18 1051 03/07/18 1102  BP:   114/79   Pulse:   (!) 56 (!) 114  Resp:   20   Temp:      TempSrc:      SpO2: 92% 92% 96%   Weight:      Height:        Intake/Output Summary (Last 24 hours) at 03/07/2018 1143 Last data filed at 03/07/2018 1102 Gross per 24 hour  Intake 720 ml  Output 1975 ml  Net -1255 ml   Filed Weights   03/05/18 0400 03/06/18 0512 03/07/18 0556  Weight: 90.7 kg 89.6 kg 88.3 kg   Body mass index is 27.93 kg/m.   Gen: Elderly male seated in chair, appears comfortable at rest. HEENT: Conjunctiva and lids normal, oropharynx clear. Neck: Supple, no elevated JVP or carotid bruits, no thyromegaly. Lungs: Decreased breath sounds with "dry" crackles in the lower lung fields, nonlabored breathing at rest. Cardiac: Irregularly irregular, no S3, 2/6 systolic murmur, no pericardial rub. Abdomen: Soft, nontender, bowel sounds present. Extremities: Mild lower leg  edema, distal pulses 2+. Skin: Warm and dry. Musculoskeletal: No kyphosis. Neuropsychiatric: Alert and oriented x3, affect grossly appropriate.  EKG:  I personally reviewed the tracing from 03/04/2018 which showed sinus rhythm with right bundle branch block and left anterior fascicular block.  Telemetry:  I personally reviewed telemetry which shows atrial fibrillation.  Relevant CV Studies:  Echocardiogram 02/07/2018: Study Conclusions  - Left ventricle: The cavity size was normal. Wall thickness was   increased in  a pattern of mild LVH. Systolic function was normal.   The estimated ejection fraction was in the range of 55% to 60%.   Wall motion was normal; there were no regional wall motion   abnormalities. Findings consistent with left ventricular   diastolic dysfunction, grade indeterminate. Doppler parameters   are consistent with high ventricular filling pressure. - Ventricular septum: Septal motion showed abnormal function and   dyssynergy due to a conduction delay. The contour showed systolic   flattening. These changes are consistent with RV pressure   overload. - Mitral valve: Mildly calcified annulus. Normal thickness leaflets   . There was mild regurgitation. - Right ventricle: The cavity size was mildly to moderately   dilated. - Right atrium: The atrium was mildly dilated. - Atrial septum: No defect or patent foramen ovale was identified. - Tricuspid valve: There was moderate regurgitation. - Pulmonary arteries: PA peak pressure: 65 mm Hg (S).  Laboratory Data:  Chemistry Recent Labs  Lab 03/05/18 0658 03/06/18 0635 03/07/18 0518  NA 138 139 137  K 4.3 4.0 4.1  CL 99 99 96*  CO2 32 33* 31  GLUCOSE 179* 150* 207*  BUN 22 23 26*  CREATININE 1.28* 1.23 1.47*  CALCIUM 8.3* 8.6* 8.9  GFRNONAA 52* 54* 44*  GFRAA >60 >60 51*  ANIONGAP 7 7 10     Recent Labs  Lab 03/04/18 1553  PROT 7.5  ALBUMIN 3.2*  AST 17  ALT 13  ALKPHOS 52  BILITOT 1.3*    Hematology Recent Labs  Lab 03/04/18 1553 03/05/18 0658 03/06/18 0635  WBC 8.1 8.2 6.9  RBC 4.21* 3.98* 3.86*  HGB 11.7* 10.9* 10.7*  HCT 37.6* 35.7* 34.9*  MCV 89.3 89.7 90.4  MCH 27.8 27.4 27.7  MCHC 31.1 30.5 30.7  RDW 15.0 15.0 14.9  PLT 175 180 188   Cardiac Enzymes Recent Labs  Lab 03/04/18 1553 03/04/18 1810 03/05/18 0116 03/05/18 0658  TROPONINI 0.13* 0.16* 0.13* 0.17*   No results for input(s): TROPIPOC in the last 168 hours.  BNP Recent Labs  Lab 03/04/18 1553  BNP 321.0*    Radiology/Studies:  Dg Chest Portable 1 View  Result Date: 03/04/2018 CLINICAL DATA:  Shortness of breath for 2 days EXAM: PORTABLE CHEST 1 VIEW COMPARISON:  02/06/2018 FINDINGS: Cardiac shadow is enlarged but stable. Central vascular congestion is again identified similar to that seen on the prior exam. No focal confluent infiltrate is seen. Mild bibasilar atelectasis is noted. IMPRESSION: Chronic appearing changes stable from the previous exam. Persistent changes of vascular congestion are noted. Electronically Signed   By: Alcide Clever M.D.   On: 03/04/2018 15:56    Assessment and Plan:   1.  Newly documented atrial fibrillation, persisting at this time, but suspect paroxysmal particularly in light of what looks to be significant chronic lung disease.  He states that he has been aware of palpitations intermittently, particularly when he is short of breath.  CHADSVASC score is 5.  He is on Coreg at baseline with recent addition of short acting Cardizem for additional heart rate control.  2.  Acute on chronic diastolic heart failure with increased LV filling pressures.  This is complicated by shortness of breath related to chronic lung disease and also recurring atrial fibrillation.  3.  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  He is being managed at this time by Dr. Juanetta Gosling with plan for PFTs and additional follow-up.  He does have moderate to severe  pulmonary hypertension which  is likely related to his lungs.  4.  Essential hypertension.  5.  Type 2 diabetes mellitus.  6.  Mixed hyperlipidemia.  I reviewed his current medications.  Would stop aspirin and heparin, initiate Eliquis 5 mg twice daily for stroke prophylaxis.  Continue Coreg at current dose, for now agree with addition of short acting Cardizem for heart rate control.  I think the likelihood of keeping him in sinus rhythm regularly is going to be low in light of his chronic lung disease, and therefore will adopt strategy of heart rate control and anticoagulation.  May run into some trouble with tachycardia-bradycardia syndrome depending on his heart rate when he is in sinus rhythm as he does have some evidence of conduction system disease by ECG.  He has diuresed fairly well on IV Lasix and creatinine has bumped up from 1.2-1.4.  Diuretics will be cut back as well.  Signed, Nona Dell, MD  03/07/2018 11:43 AM

## 2018-03-07 NOTE — Care Management Important Message (Signed)
Important Message  Patient Details  Name: Mario Walker MRN: [id number] Date of Birth: 10/26/1940   Medicare Important Message Given:  Yes    Renie OraHawkins, Virlan Kempker Smith 03/07/2018, 10:34 AM

## 2018-03-07 NOTE — Progress Notes (Signed)
Per CCMD, pt converted A. Fib overnight. Pt's HR ranging 100's-140's. Asymptomatic at this time. Pt denies any history of A.fib. Dr. Arbutus Leasat paged and made aware. Will continue to monitor.

## 2018-03-08 LAB — BASIC METABOLIC PANEL
ANION GAP: 9 (ref 5–15)
ANION GAP: 10 (ref 5–15)
BUN: 43 mg/dL — AB (ref 8–23)
BUN: 41 mg/dL — ABNORMAL HIGH (ref 8–23)
CHLORIDE: 93 mmol/L — AB (ref 98–111)
CO2: 26 mmol/L (ref 22–32)
CO2: 30 mmol/L (ref 22–32)
Calcium: 8.6 mg/dL — ABNORMAL LOW (ref 8.9–10.3)
Calcium: 9.4 mg/dL (ref 8.9–10.3)
Chloride: 95 mmol/L — ABNORMAL LOW (ref 98–111)
Creatinine, Ser: 1.88 mg/dL — ABNORMAL HIGH (ref 0.61–1.24)
Creatinine, Ser: 1.71 mg/dL — ABNORMAL HIGH (ref 0.61–1.24)
GFR calc Af Amer: 38 mL/min — ABNORMAL LOW (ref >60)
GFR calc non Af Amer: 37 mL/min — ABNORMAL LOW (ref >60)
GFR, EST AFRICAN AMERICAN: 42 mL/min — AB (ref >60)
GFR, EST NON AFRICAN AMERICAN: 33 mL/min — AB (ref >60)
GLUCOSE: 344 mg/dL — AB (ref 70–99)
Glucose, Bld: 190 mg/dL — ABNORMAL HIGH (ref 70–99)
POTASSIUM: 5.4 mmol/L — AB (ref 3.5–5.1)
POTASSIUM: 5 mmol/L (ref 3.5–5.1)
Sodium: 130 mmol/L — ABNORMAL LOW (ref 135–145)
Sodium: 133 mmol/L — ABNORMAL LOW (ref 135–145)

## 2018-03-08 LAB — MAGNESIUM: Magnesium: 2 mg/dL (ref 1.7–2.4)

## 2018-03-08 LAB — GLUCOSE, CAPILLARY
Glucose-Capillary: 277 mg/dL — ABNORMAL HIGH (ref 70–99)
Glucose-Capillary: 240 mg/dL — ABNORMAL HIGH (ref 70–99)

## 2018-03-08 MED ORDER — FUROSEMIDE 40 MG PO TABS: 40.0000 mg | ORAL_TABLET | Freq: Every day | ORAL | 0 refills | Status: AC

## 2018-03-08 MED ORDER — DILTIAZEM HCL ER COATED BEADS 120 MG PO CP24: 120.0000 mg | ORAL_CAPSULE | Freq: Every day | ORAL | 2 refills | Status: AC

## 2018-03-08 MED ORDER — ALBUTEROL SULFATE HFA 108 (90 BASE) MCG/ACT IN AERS: 2.0000 | INHALATION_SPRAY | Freq: Four times a day (QID) | RESPIRATORY_TRACT | 1 refills | Status: AC | PRN

## 2018-03-08 MED ORDER — SILDENAFIL CITRATE 20 MG PO TABS: 20.0000 mg | ORAL_TABLET | Freq: Three times a day (TID) | ORAL | 0 refills | Status: AC

## 2018-03-08 MED ORDER — PREDNISONE 20 MG PO TABS: 40.0000 mg | ORAL_TABLET | Freq: Every day | ORAL | 0 refills | Status: AC

## 2018-03-08 MED ORDER — DILTIAZEM HCL ER COATED BEADS 120 MG PO CP24
120.0000 mg | ORAL_CAPSULE | Freq: Every day | ORAL | Status: DC
  Filled 2018-03-08: qty 1

## 2018-03-08 MED ORDER — APIXABAN 5 MG PO TABS: 5.0000 mg | ORAL_TABLET | Freq: Two times a day (BID) | ORAL | 0 refills | Status: AC

## 2018-03-08 NOTE — Progress Notes (Signed)
Progress Note  Patient Name: Mario Walker Date of Encounter: 03/08/2018  Primary Cardiologist:  New   Subjective   Pt is breathing good  At baseline   Denies CP   No palpitations    Inpatient Medications    Scheduled Meds: . apixaban  5 mg Oral BID  . atorvastatin  40 mg Oral Daily  . budesonide (PULMICORT) nebulizer solution  0.5 mg Nebulization BID  . busPIRone  10 mg Oral TID  . carvedilol  25 mg Oral BID  . diltiazem  30 mg Oral Q6H  . DULoxetine  60 mg Oral Daily  . furosemide  40 mg Oral Daily  . insulin aspart  0-15 Units Subcutaneous TID WC  . insulin aspart  0-5 Units Subcutaneous QHS  . insulin aspart  3 Units Subcutaneous TID WC  . insulin detemir  30 Units Subcutaneous Q2200  . ipratropium-albuterol  3 mL Nebulization TID  . pantoprazole  40 mg Oral Daily  . polyethylene glycol  17 g Oral Daily  . predniSONE  40 mg Oral Q breakfast  . senna  2 tablet Oral Daily  . sildenafil  20 mg Oral TID  . sodium chloride flush  3 mL Intravenous Q12H   Continuous Infusions: . sodium chloride Stopped (03/07/18 1209)   PRN Meds: sodium chloride, acetaminophen, HYDROcodone-acetaminophen, ondansetron (ZOFRAN) IV, sodium chloride, sodium chloride flush   Vital Signs    Vitals:   03/08/18 0617 03/08/18 0834 03/08/18 0837 03/08/18 1200  BP:      Pulse:    87  Resp:      Temp:      TempSrc:      SpO2:  98% 100%   Weight: 89.7 kg     Height:        Intake/Output Summary (Last 24 hours) at 03/08/2018 1401 Last data filed at 03/08/2018 0858 Gross per 24 hour  Intake 480 ml  Output 550 ml  Net -70 ml    Net negative 3.44 L   Filed Weights   03/06/18 0512 03/07/18 0556 03/08/18 0617  Weight: 89.6 kg 88.3 kg 89.7 kg    Telemetry    Afib   80s   Personally Reviewed  ECG     Physical Exam   GEN: No acute distress.   Neck: No JVD Cardiac:  Irreg irreg   No S3  , no murmurs, rubs, or gallops.  Respiratory: Clear to auscultation bilaterally.  Cracles at  bases   GI: Soft, nontender, non-distended  MS: Tr  edema; No deformity. Neuro:  Nonfocal  Psych: Normal affect   Labs    Chemistry Recent Labs  Lab 03/04/18 1553  03/07/18 0518 03/08/18 0540 03/08/18 1209  NA 133*   < > 137 130* 133*  K 4.6   < > 4.1 5.4* 5.0  CL 95*   < > 96* 95* 93*  CO2 29   < > 31 26 30   GLUCOSE 187*   < > 207* 344* 190*  BUN 24*   < > 26* 43* 41*  CREATININE 1.41*   < > 1.47* 1.88* 1.71*  CALCIUM 8.2*   < > 8.9 8.6* 9.4  PROT 7.5  --   --   --   --   ALBUMIN 3.2*  --   --   --   --   AST 17  --   --   --   --   ALT 13  --   --   --   --  ALKPHOS 52  --   --   --   --   BILITOT 1.3*  --   --   --   --   GFRNONAA 46*   < > 44* 33* 37*  GFRAA 54*   < > 51* 38* 42*  ANIONGAP 9   < > 10 9 10    < > = values in this interval not displayed.     Hematology Recent Labs  Lab 03/04/18 1553 03/05/18 0658 03/06/18 0635  WBC 8.1 8.2 6.9  RBC 4.21* 3.98* 3.86*  HGB 11.7* 10.9* 10.7*  HCT 37.6* 35.7* 34.9*  MCV 89.3 89.7 90.4  MCH 27.8 27.4 27.7  MCHC 31.1 30.5 30.7  RDW 15.0 15.0 14.9  PLT 175 180 188    Cardiac Enzymes Recent Labs  Lab 03/04/18 1553 03/04/18 1810 03/05/18 0116 03/05/18 0658  TROPONINI 0.13* 0.16* 0.13* 0.17*   No results for input(s): TROPIPOC in the last 168 hours.   BNP Recent Labs  Lab 03/04/18 1553  BNP 321.0*     DDimer No results for input(s): DDIMER in the last 168 hours.   Radiology    No results found.  Cardiac Studies     Patient Profile     Mario Walker is a 77 y.o. male with a history of hypertension, hyperlipidemia, type 2 diabetes mellitus, and GERD who is being seen today for the evaluation of newly documented atrial fibrillation at the request of Tat.  Assessment & Plan    1  Atrial fibrillation.  Patient still in atrial fib  Rates controlled   Will need to be followed as outpt   COntinue carvedilol and diltiazem   Switch to 120 PO daily May need to switch him to metoprolol for better HR  control, less bp lowering  Follow as outpt Pt on Eiquis now from prevention of clot formation    2  Acute on chronic diastolic CHF  FPatient had signif diuresis   Says he is breathign at baseline  Prob due to afib with RVR. Exam:  Still with evidence of some volume increase on exam   Would continue PO lasix   Will need f/u as outpt.  3  COPD  Followed by Dr Juanetta Gosling   PFTs pending   4  HTN  BP is a little low at times   WIll need to be followed.  NOt unreasonable to d/c home today   WIll make sure he has outpt appts.    For questions or updates, please contact [facility] Please consult [email or web address] for contact info under        Signed, Dietrich Pates, MD  03/08/2018, 2:01 PM

## 2018-03-08 NOTE — Progress Notes (Signed)
Pt discharged home today per MD. Pt's IV site D/C'd and WDL. Pt's VSS. Pt provided with home medication list, discharge instructions and prescriptions. Verbalized understanding. Pt left floor via WC in stable condition accompanied by NT. 

## 2018-03-08 NOTE — Discharge Summary (Signed)
Physician Discharge Summary  Mario Walker RUE:[id number] DOB: Mar 16, 1941 DOA: 03/04/2018  PCP: Craig Staggers, MD  Admit date: 03/04/2018 Discharge date: 03/08/2018  Admitted From: Home Disposition:  Home  Recommendations for Outpatient Follow-up:  1. Follow up with PCP in 1-2 weeks 2. Please obtain BMP/CBC in one week   Home Health:YES Equipment/Devices: 2L  Discharge Condition: Stable CODE STATUS:FULL Diet recommendation: Heart Healthy / Carb Modified  Brief/Interim Summary: 77 y.o.malewith medical history oftobacco abuse, diabetes mellitus, hypertension, and hyperlipidemia presenting with3 day history of sob.Patient states that he has had dyspnea on exertion with minimal activity. He also notes some orthopnea type symptoms with increasing lower extremity edema. Patient was recently discharged from the hospital after a stay from 02/06/2018 through 02/08/2018 when he was treated for acute diastolic CHF and COPD exacerbation. His discharge weight was 191 pounds. He was discharged home on furosemide 40 mg twice daily. The patient endorses some dietary indiscretion. However, he states that he has been compliant with his medications and fluid restriction. He has noted some chest discomfort associated with the shortness of breath at the time of admission. He denies fevers, chills, worsening cough, hemoptysis, nausea, vomiting, diarrhea, abdominal pain, dysuria. The patient has a remote history of tobacco with a total of approximately 30 pack years. He quit smoking 50 years ago. At baseline, the patient is on 2 L nasal cannula. However, he has had to increase his oxygen to 4 L the past 2 to 3 days. Upon admission, chest x-ray showed vascular congestion with BNP 321. The patient's troponin was initially elevated 0.13. He was started on IV heparin initially. He was noted to have oxygen saturation of 65% in the emergency department and was initially placed on  BiPAP.  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 -02/07/2018 echo EF 55-60%, no WMA, moderate TR, PASP 65 -furosemide 60 mg IV twice daily>>>po lasix -Strict I's and O's -Fluid restrict -02/08/18 discharge weight 191 -Admission weight 200 pounds -NEG 5.4 lbs since admission--discharge weight 194.6 -Patient endorses dietary indiscretion -?tachycardia induced decompensation, now with Afib -personally reviewed CXR--increased interstitial markings -may ultimately need right heart cath  New onset Atrial Fibrillation, type unspecified -03/07/18 early am--telemetry showed Afib -CHADSVASc = 6 (CHF, HTN, Age, DM, ASVD) -start IV heparin>>>apixaban -cardiology consult--maintain rate control as pt will not likely stay in sinus -started diltiazem-->diltiazem CD 120 mg daily  Elevated troponin -due to decompensated CHF -trend is flat -personally reviewed EKG--sinus, RBBB unchanged -d/c heparin  COPD -Patient has mild bronchospasm on examination -Start duo nebs and Pulmicort during hospitalization -Dr. Juanetta Gosling consulted per patient request-->d/c home with revatio and prednisone  Diabetes mellitus type 2 -NovoLog sliding scale -8/18/19Hemoglobin A1c--6.6 -Holding glimepirideand metformin during hospitalization -will not restart metformin at time of d/c due to worsen renal function -IncreaseLantus 30units at hs-->d/c home with 35 units -add novolog 5 units with meals -novolog sliding scale  Essential hypertension -Continue carvedilol -hold amlodipine to allow for BP margin for diuresis -d/c amlodipine with starting cardizem  CKD stage III -Baseline creatinine1.1-1.4 -Monitor with  diuresis  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 with reviewed of old medical records -Echo--noted above  Mediastinal lymphadenopathy -02/06/18 CT chest--moderately enlarged mediastinal lymphadenopathy with enlarged right pretracheal and right supra  carinal lymph nodes;mild thickening and distortion of the interstitium with upper lobe predominant paraseptal emphysema;mosaic attenuation of the lung parenchyma with possible interstitial edema;shotty retroperitoneal and mesenteric lymphadenopathy -check ANA--neg -check ANCA--neg -saw Dr. Tyrone Sage on 8/28-->follow up CT chest in 4 months  Hypomagnesemia -replete -recheck mag   Discharge Instructions   Allergies as of 03/08/2018   No Known Allergies     Medication List    STOP taking these medications   amLODipine 10 MG tablet Commonly known as:  NORVASC   amLODipine 5 MG tablet Commonly known as:  NORVASC   aspirin EC 81 MG tablet   doxazosin 8 MG tablet Commonly known as:  CARDURA   metFORMIN 500 MG tablet Commonly known as:  GLUCOPHAGE     TAKE these medications   apixaban 5 MG Tabs tablet Commonly known as:  ELIQUIS Take 1 tablet (5 mg total) by mouth 2 (two) times daily.   atorvastatin 40 MG tablet Commonly known as:  LIPITOR Take 1 tablet by mouth daily.   busPIRone 10 MG tablet Commonly known as:  BUSPAR Take 10 mg by mouth 3 (three) times daily.   carvedilol 25 MG tablet Commonly known as:  COREG Take 1 tablet by mouth 2 (two) times daily.   diltiazem 120 MG 24 hr capsule Commonly known as:  CARDIZEM CD Take 1 capsule (120 mg total) by mouth daily.   DULoxetine 60 MG capsule Commonly known as:  CYMBALTA Take 60 mg by mouth daily.   furosemide 40 MG tablet Commonly known as:  LASIX Take 1 tablet (40 mg total) by mouth daily. Start taking on:  03/09/2018 What changed:  when to take this   glimepiride 2 MG tablet Commonly known as:  AMARYL Take 1 tablet by mouth daily.    HYDROcodone-acetaminophen 5-325 MG tablet Commonly known as:  NORCO/VICODIN Take 1-2 tablets by mouth every 4 (four) hours as needed.   insulin aspart 100 UNIT/ML injection Commonly known as:  novoLOG Inject into the skin 3 (three) times daily before meals.   ipratropium-albuterol 0.5-2.5 (3) MG/3ML Soln Commonly known as:  DUONEB USE 1 AMPULE IN NEBULIZER THREE TIMES DAILY AS NEEDED   LEVEMIR FLEXTOUCH 100 UNIT/ML Pen Generic drug:  Insulin Detemir INJECT 35 UNITS SUBCUTANEOUSLY AT BEDTIME   multivitamin-iron-minerals-folic acid chewable tablet Chew 1 tablet by mouth daily.   omeprazole 40 MG capsule Commonly known as:  PRILOSEC Take 1 capsule by mouth daily.   predniSONE 20 MG tablet Commonly known as:  DELTASONE Take 2 tablets (40 mg total) by mouth daily with breakfast. X 3 days. Then 1 tablet (20 mg) daily x 3 days Start taking on:  03/09/2018   sildenafil 20 MG tablet Commonly known as:  REVATIO Take 1 tablet (20 mg total) by mouth 3 (three) times daily.   VITAMIN A PO Take 1 tablet by mouth daily.   VITAMIN B-12 PO Take 1 tablet by mouth daily.   VITAMIN C PO Take 1 tablet by mouth daily.   VITAMIN D PO Take 1 tablet by mouth daily.      Follow-up Information    Ellsworth Lennox, PA-C On 04/07/2019.   Specialties:  Physician Assistant, Cardiology Why:  at 2:00 pm Contact information: [address] [place] [address] [phone]          No Known Allergies  Consultations:  pulm  cardiology   Procedures/Studies: Ct Chest Wo Contrast  Result Date: 02/06/2018 CLINICAL DATA:  Increasing shortness of breath. EXAM: CT  CHEST WITHOUT CONTRAST TECHNIQUE: Multidetector CT imaging of the chest was performed following the standard protocol without IV contrast. COMPARISON:  Chest radiograph 02/06/2018 FINDINGS: Cardiovascular: Enlarged heart. Minimal pericardial thickening. Calcific atherosclerotic disease of the coronary arteries and aorta.  Mediastinum/Nodes: Moderately enlarged bilateral mediastinal lymph nodes at all stations. Index right pretracheal lymph node measures 12 mm in short axis and right supra carinal lymph node measures 14 mm in short axis. Lungs/Pleura: Mild thickening and distortion of the interstitium with upper lobe predominant paraseptal emphysema. Mosaic attenuation of the lung parenchyma. Probable mild interstitial pulmonary edema. No pleural effusions or lobar consolidation. Upper Abdomen: Cholelithiasis. Shotty retroperitoneal and mesenteric lymph nodes, sub pathologic by CT criteria. Atherosclerotic calcifications of the aorta. Musculoskeletal: Spondylosis of the thoracic spine. IMPRESSION: Chronic interstitial lung changes with mosaic attenuation of the lung parenchyma. Differential diagnosis includes obstructive small airway disease, air trapping, or occlusive vascular disease. Emphysema. Mediastinal lymphadenopathy, likely reactive. Enlarged heart with mild interstitial pulmonary edema. Aortic Atherosclerosis (ICD10-I70.0) and Emphysema (ICD10-J43.9). Electronically Signed   By: Ted Mcalpineobrinka  Dimitrova M.D.   On: 02/06/2018 17:17   Dg Chest Portable 1 View  Result Date: 03/04/2018 CLINICAL DATA:  Shortness of breath for 2 days EXAM: PORTABLE CHEST 1 VIEW COMPARISON:  02/06/2018 FINDINGS: Cardiac shadow is enlarged but stable. Central vascular congestion is again identified similar to that seen on the prior exam. No focal confluent infiltrate is seen. Mild bibasilar atelectasis is noted. IMPRESSION: Chronic appearing changes stable from the previous exam. Persistent changes of vascular congestion are noted. Electronically Signed   By: Alcide CleverMark  Lukens M.D.   On: 03/04/2018 15:56         Discharge Exam: Vitals:   03/08/18 1413 03/08/18 1415  BP: (!) 104/59   Pulse: 71   Resp: 18   Temp: 97.6 F (36.4 C)   SpO2: 98% 98%   Vitals:   03/08/18 1200 03/08/18 1409 03/08/18 1413 03/08/18 1415  BP:  129/65 (!) 104/59     Pulse: 87 (!) 46 71   Resp:  18 18   Temp:  97.7 F (36.5 C) 97.6 F (36.4 C)   TempSrc:  Oral Oral   SpO2:  100% 98% 98%  Weight:      Height:        General: Pt is alert, awake, not in acute distress Cardiovascular: IRRR, S1/S2 +, no rubs, no gallops Respiratory: bibasilar crackles, no wheeze Abdominal: Soft, NT, ND, bowel sounds + Extremities: no edema, no cyanosis   The results of significant diagnostics from this hospitalization (including imaging, microbiology, ancillary and laboratory) are listed below for reference.    Significant Diagnostic Studies: Ct Chest Wo Contrast  Result Date: 02/06/2018 CLINICAL DATA:  Increasing shortness of breath. EXAM: CT CHEST WITHOUT CONTRAST TECHNIQUE: Multidetector CT imaging of the chest was performed following the standard protocol without IV contrast. COMPARISON:  Chest radiograph 02/06/2018 FINDINGS: Cardiovascular: Enlarged heart. Minimal pericardial thickening. Calcific atherosclerotic disease of the coronary arteries and aorta. Mediastinum/Nodes: Moderately enlarged bilateral mediastinal lymph nodes at all stations. Index right pretracheal lymph node measures 12 mm in short axis and right supra carinal lymph node measures 14 mm in short axis. Lungs/Pleura: Mild thickening and distortion of the interstitium with upper lobe predominant paraseptal emphysema. Mosaic attenuation of the lung parenchyma. Probable mild interstitial pulmonary edema. No pleural effusions or lobar consolidation. Upper Abdomen: Cholelithiasis. Shotty retroperitoneal and mesenteric lymph nodes, sub pathologic by CT criteria. Atherosclerotic calcifications of the aorta. Musculoskeletal: Spondylosis of the thoracic spine. IMPRESSION:  Chronic interstitial lung changes with mosaic attenuation of the lung parenchyma. Differential diagnosis includes obstructive small airway disease, air trapping, or occlusive vascular disease. Emphysema. Mediastinal lymphadenopathy, likely  reactive. Enlarged heart with mild interstitial pulmonary edema. Aortic Atherosclerosis (ICD10-I70.0) and Emphysema (ICD10-J43.9). Electronically Signed   By: Ted Mcalpine M.D.   On: 02/06/2018 17:17   Dg Chest Portable 1 View  Result Date: 03/04/2018 CLINICAL DATA:  Shortness of breath for 2 days EXAM: PORTABLE CHEST 1 VIEW COMPARISON:  02/06/2018 FINDINGS: Cardiac shadow is enlarged but stable. Central vascular congestion is again identified similar to that seen on the prior exam. No focal confluent infiltrate is seen. Mild bibasilar atelectasis is noted. IMPRESSION: Chronic appearing changes stable from the previous exam. Persistent changes of vascular congestion are noted. Electronically Signed   By: Alcide Clever M.D.   On: 03/04/2018 15:56     Microbiology: Recent Results (from the past 240 hour(s))  MRSA PCR Screening     Status: None   Collection Time: 03/04/18  8:50 PM  Result Value Ref Range Status   MRSA by PCR NEGATIVE NEGATIVE Final    Comment:        The GeneXpert MRSA Assay (FDA approved for NASAL specimens only), is one component of a comprehensive MRSA colonization surveillance program. It is not intended to diagnose MRSA infection nor to guide or monitor treatment for MRSA infections. Performed at [facility], [address], [place], [place] [address]      Labs: Basic Metabolic Panel: Recent Labs  Lab 03/05/18 [id number] 03/06/18 0635 03/07/18 0518 03/08/18 0540 03/08/18 1209  NA 138 139 137 130* 133*  K 4.3 4.0 4.1 5.4* 5.0  CL 99 99 96* 95* 93*  CO2 32 33* 31 26 30   GLUCOSE 179* 150* 207* 344* 190*  BUN 22 23 26* 43* 41*  CREATININE 1.28* 1.23 1.47* 1.88* 1.71*  CALCIUM 8.3* 8.6* 8.9 8.6* 9.4  MG  --   --  1.5* 2.0  --    Liver Function Tests: Recent Labs  Lab 03/04/18 1553  AST 17  ALT 13  ALKPHOS 52  BILITOT 1.3*  PROT 7.5  ALBUMIN 3.2*   No results for input(s): LIPASE, AMYLASE in the last 168 hours. No results for input(s): AMMONIA  in the last 168 hours. CBC: Recent Labs  Lab 03/04/18 1553 03/05/18 0658 03/06/18 0635  WBC 8.1 8.2 6.9  NEUTROABS 6.4  --   --   HGB 11.7* 10.9* 10.7*  HCT 37.6* 35.7* 34.9*  MCV 89.3 89.7 90.4  PLT 175 180 188   Cardiac Enzymes: Recent Labs  Lab 03/04/18 1553 03/04/18 1810 03/05/18 0116 03/05/18 0658  TROPONINI 0.13* 0.16* 0.13* 0.17*   BNP: Invalid input(s): POCBNP CBG: Recent Labs  Lab 03/07/18 1133 03/07/18 1654 03/07/18 2035 03/08/18 0731 03/08/18 1129  GLUCAP 268* 312* 356* 277* 240*    Time coordinating discharge:  36 minutes  Signed:  Catarina Hartshorn, DO [facility] Pager: [phone] 03/08/2018, 3:44 PM

## 2018-03-08 NOTE — Discharge Instructions (Signed)

## 2018-03-08 NOTE — Care Management Note (Signed)
Case Management Note  Patient Details  Name: Mario Walker MRN: [id number] Date of Birth: 08/26/1940  Subjective/Objective:  Admitted with CHF/COPD. Pt from home, lives with gf. Pt ind with ADL's. Has insurance with drug coverage and PCP. PT has neb machine and home O2 pta. This is pt's second admission is past 6 months. [facility]                  Action/Plan: DC home today with self care. Pt endorses compliance with diet "most of the time" and is compliant with medications. No CM needs noted at this time.    Expected Discharge Date:  03/08/18               Expected Discharge Plan:  Home/Self Care  In-House Referral:  NA  Discharge planning Services  CM Consult  Post Acute Care Choice:  NA Choice offered to:  NA  Status of Service:  Completed, signed off  Malcolm MetroChildress, Donnae Michels Demske, RN 03/08/2018, 3:23 PM

## 2018-03-08 NOTE — Progress Notes (Signed)
Subjective: He says he feels much better.  He had persistent atrial fib and has been seen by cardiology who feel that it will be unlikely that he can maintain sinus rhythm because of his COPD.  His echocardiogram does show that he has right ventricular enlargement and has pulmonary hypertension .  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  Objective: Vital signs in last 24 hours: Temp:  [97.5 F (36.4 C)-98.2 F (36.8 C)] 97.5 F (36.4 C) (09/17 0558) Pulse Rate:  [44-130] 73 (09/17 0558) Resp:  [18-24] 18 (09/17 0558) BP: (107-137)/(69-79) 137/77 (09/17 0558) SpO2:  [93 %-100 %] 96 % (09/17 0558) Weight:  [89.7 kg] 89.7 kg (09/17 0617) Weight change: 1.421 kg Last BM Date: 03/04/18  Intake/Output from previous day: 09/16 0701 - 09/17 0700 In: 771.7 [P.O.:720; I.V.:1.7; IV Piggyback:50] Out: 525 [Urine:525]  PHYSICAL EXAM General appearance: alert, cooperative and no distress Resp: rales bilaterally Cardio: He has atrial fib with controlled ventricular response GI: soft, non-tender; bowel sounds normal; no masses,  no organomegaly Extremities: She is had right leg amputation below the knee.  Lab Results:  Results for orders placed or performed during the hospital encounter of 03/04/18 (from the past 48 hour(s))  Glucose, capillary     Status: Abnormal   Collection Time: 03/06/18 11:05 AM  Result Value Ref Range   Glucose-Capillary 202 (H) 70 - 99 mg/dL   Comment 1 Notify RN    Comment 2 Document in Chart   Glucose, capillary     Status: Abnormal   Collection Time: 03/06/18  4:20 PM  Result Value Ref Range   Glucose-Capillary 242 (H) 70 - 99 mg/dL  Glucose, capillary     Status: Abnormal   Collection Time: 03/06/18  9:44 PM  Result Value Ref Range   Glucose-Capillary 227 (H) 70 - 99 mg/dL  Basic metabolic panel     Status: Abnormal   Collection Time: 03/07/18  5:18 AM  Result Value Ref Range   Sodium 137 135 - 145 mmol/L   Potassium 4.1 3.5 - 5.1 mmol/L   Chloride 96 (L) 98 - 111 mmol/L   CO2 31 22 - 32 mmol/L   Glucose, Bld 207 (H) 70 - 99 mg/dL   BUN 26 (H) 8 - 23 mg/dL   Creatinine, Ser 1.47 (H) 0.61 - 1.24 mg/dL   Calcium 8.9 8.9 - 10.3 mg/dL   GFR calc non Af Amer 44 (L) >60 mL/min   GFR calc Af Amer 51 (L) >60 mL/min    Comment: (NOTE) The eGFR has been calculated using the CKD EPI equation. This calculation has not been validated in all clinical situations. eGFR's persistently <60 mL/min signify possible Chronic Kidney Disease.    Anion gap 10 5 - 15    Comment: Performed at [facility], [address], [place], [place] [address]  Magnesium     Status: Abnormal   Collection Time: 03/07/18  5:18 AM  Result Value Ref Range   Magnesium 1.5 (L) 1.7 - 2.4 mg/dL    Comment: Performed at [facility], [address] [place], [place] [address]  Glucose, capillary     Status: Abnormal   Collection Time: 03/07/18  7:53 AM  Result Value Ref Range   Glucose-Capillary 183 (H) 70 - 99 mg/dL  Protime-INR     Status: None   Collection Time: 03/07/18 10:56 AM  Result Value Ref Range   Prothrombin Time 14.0 11.4 - 15.2 seconds   INR 1.09  Comment: Performed at [facility], [address] [place], [place] [address]  Glucose, capillary     Status: Abnormal   Collection Time: 03/07/18 11:33 AM  Result Value Ref Range   Glucose-Capillary 268 (H) 70 - 99 mg/dL  Glucose, capillary     Status: Abnormal   Collection Time: 03/07/18  4:54 PM  Result Value Ref Range   Glucose-Capillary 312 (H) 70 - 99 mg/dL  Glucose, capillary     Status: Abnormal   Collection Time: 03/07/18  8:35 PM  Result Value Ref Range   Glucose-Capillary 356 (H) 70 - 99 mg/dL  Basic metabolic panel     Status: Abnormal   Collection Time: 03/08/18  5:40 AM  Result Value Ref Range   Sodium 130 (L) 135 - 145 mmol/L    Comment: DELTA CHECK NOTED   Potassium 5.4 (H) 3.5 - 5.1 mmol/L    Comment: DELTA CHECK NOTED   Chloride 95 (L) 98 - 111 mmol/L   CO2 26 22 - 32  mmol/L   Glucose, Bld 344 (H) 70 - 99 mg/dL   BUN 43 (H) 8 - 23 mg/dL   Creatinine, Ser 1.88 (H) 0.61 - 1.24 mg/dL   Calcium 8.6 (L) 8.9 - 10.3 mg/dL   GFR calc non Af Amer 33 (L) >60 mL/min   GFR calc Af Amer 38 (L) >60 mL/min    Comment: (NOTE) The eGFR has been calculated using the CKD EPI equation. This calculation has not been validated in all clinical situations. eGFR's persistently <60 mL/min signify possible Chronic Kidney Disease.    Anion gap 9 5 - 15    Comment: Performed at [facility], [address], [place], [place] [address]  Magnesium     Status: None   Collection Time: 03/08/18  5:40 AM  Result Value Ref Range   Magnesium 2.0 1.7 - 2.4 mg/dL    Comment: Performed at [facility], [address] [place], [place] [address]  Glucose, capillary     Status: Abnormal   Collection Time: 03/08/18  7:31 AM  Result Value Ref Range   Glucose-Capillary 277 (H) 70 - 99 mg/dL    ABGS No results for input(s): PHART, PO2ART, TCO2, HCO3 in the last 72 hours.  Invalid input(s): PCO2 CULTURES Recent Results (from the past 240 hour(s))  MRSA PCR Screening     Status: None   Collection Time: 03/04/18  8:50 PM  Result Value Ref Range Status   MRSA by PCR NEGATIVE NEGATIVE Final    Comment:        The GeneXpert MRSA Assay (FDA approved for NASAL specimens only), is one component of a comprehensive MRSA colonization surveillance program. It is not intended to diagnose MRSA infection nor to guide or monitor treatment for MRSA infections. Performed at [facility], [address] [place], [place] [address]    Studies/Results: No results found.  Medications:  Prior to Admission:  Medications Prior to Admission  Medication Sig Dispense Refill Last Dose  . amLODipine (NORVASC) 10 MG tablet Take 10 mg by mouth daily.  1 03/04/2018 at Unknown time  . amLODipine (NORVASC) 5 MG tablet Take 1 tablet by mouth daily.    03/04/2018 at Unknown time  . Ascorbic Acid (VITAMIN C PO)  Take 1 tablet by mouth daily.   03/04/2018 at Unknown time  . aspirin EC 81 MG tablet Take 81 mg by mouth daily.   03/04/2018 at Unknown time  . atorvastatin (LIPITOR) 40 MG tablet Take  1 tablet by mouth daily.    03/03/2018 at Unknown time  . busPIRone (BUSPAR) 10 MG tablet Take 10 mg by mouth 3 (three) times daily.   03/04/2018 at Unknown time  . carvedilol (COREG) 25 MG tablet Take 1 tablet by mouth 2 (two) times daily.   03/04/2018 at Unknown time  . Cholecalciferol (VITAMIN D PO) Take 1 tablet by mouth daily.   03/04/2018 at Unknown time  . Cyanocobalamin (VITAMIN B-12 PO) Take 1 tablet by mouth daily.   03/04/2018 at Unknown time  . doxazosin (CARDURA) 8 MG tablet Take 1 tablet by mouth daily.   03/04/2018 at Unknown time  . DULoxetine (CYMBALTA) 60 MG capsule Take 60 mg by mouth daily.   03/04/2018 at Unknown time  . furosemide (LASIX) 40 MG tablet Take 40 mg by mouth 2 (two) times daily.   03/04/2018 at Unknown time  . glimepiride (AMARYL) 2 MG tablet Take 1 tablet by mouth daily.   03/04/2018 at Unknown time  . insulin aspart (NOVOLOG) 100 UNIT/ML injection Inject into the skin 3 (three) times daily before meals.   03/03/2018 at Unknown time  . LEVEMIR FLEXTOUCH 100 UNIT/ML Pen INJECT 35 UNITS SUBCUTANEOUSLY AT BEDTIME  3 03/03/2018 at Unknown time  . metFORMIN (GLUCOPHAGE) 500 MG tablet Take 1,000 mg by mouth 2 (two) times daily with a meal.    03/04/2018 at Unknown time  . multivitamin-iron-minerals-folic acid (CENTRUM) chewable tablet Chew 1 tablet by mouth daily.   03/04/2018 at Unknown time  . omeprazole (PRILOSEC) 40 MG capsule Take 1 capsule by mouth daily.    03/04/2018 at Unknown time  . VITAMIN A PO Take 1 tablet by mouth daily.   03/04/2018 at Unknown time  . HYDROcodone-acetaminophen (NORCO) 5-325 MG per tablet Take 1-2 tablets by mouth every 4 (four) hours as needed. 6 tablet 0 unknown  . ipratropium-albuterol (DUONEB) 0.5-2.5 (3) MG/3ML SOLN USE 1 AMPULE IN NEBULIZER THREE TIMES DAILY AS  NEEDED  1    Scheduled: . apixaban  5 mg Oral BID  . atorvastatin  40 mg Oral Daily  . budesonide (PULMICORT) nebulizer solution  0.5 mg Nebulization BID  . busPIRone  10 mg Oral TID  . carvedilol  25 mg Oral BID  . diltiazem  30 mg Oral Q6H  . DULoxetine  60 mg Oral Daily  . furosemide  40 mg Oral Daily  . insulin aspart  0-15 Units Subcutaneous TID WC  . insulin aspart  0-5 Units Subcutaneous QHS  . insulin aspart  3 Units Subcutaneous TID WC  . insulin detemir  30 Units Subcutaneous Q2200  . ipratropium-albuterol  3 mL Nebulization TID  . pantoprazole  40 mg Oral Daily  . polyethylene glycol  17 g Oral Daily  . potassium chloride  40 mEq Oral Daily  . predniSONE  40 mg Oral Q breakfast  . senna  2 tablet Oral Daily  . sildenafil  20 mg Oral TID  . sodium chloride flush  3 mL Intravenous Q12H   Continuous: . sodium chloride Stopped (03/07/18 1209)   RKY:HCWCBJ chloride, acetaminophen, HYDROcodone-acetaminophen, ondansetron (ZOFRAN) IV, sodium chloride, sodium chloride flush  Assesment: He was admitted with acute on chronic hypoxic and hypercapnic respiratory failure that is multifactorial and includes COPD, pulmonary fibrosis and probably his heart failure.  He has acute congestive heart failure which is diastolic.  He has elements of right heart failure/cor pulmonale on his echocardiogram and has moderate pulmonary hypertension.  He is better now.  He had atrial fib with RVR and he is still in atrial for but now has controlled ventricular response.   Principal Problem:   Acute on chronic respiratory failure with hypoxia and hypercapnia (HCC) Active Problems:   COPD exacerbation (HCC)   Acute CHF (congestive heart failure) (HCC)   CKD (chronic kidney disease) stage 3, GFR 30-59 ml/min (HCC)   Essential hypertension   Diabetes mellitus without complication (HCC)   Acid reflux   Elevated troponin   Acute on chronic diastolic CHF (congestive heart failure) (HCC)   Atrial  fibrillation with RVR ([place])    Plan: Continue treatments.  My office has arranged outpatient pulmonary function testing that will be done before he comes to see me on the 30th of this month.  He will need to go home on steroids.  He requests a rescue inhaler at home he may need to go on a maintenance inhaler but some of that will depend on what happens with his heart rate.    LOS: 4 days   Alysha Doolan L 03/08/2018, 8:32 AM

## 2018-03-08 NOTE — Progress Notes (Signed)
Inpatient Diabetes Program Recommendations  AACE/ADA: New Consensus Statement on Inpatient Glycemic Control (2015)  Target Ranges:  Prepandial:   less than 140 mg/dL      Peak postprandial:   less than 180 mg/dL (1-2 hours)      Critically ill patients:  140 - 180 mg/dL   Results for Mario Walker, Sajad R (MRN [id number]) as of 03/08/2018 15:17  Ref. Range 03/07/2018 07:53 03/07/2018 11:33 03/07/2018 16:54 03/07/2018 20:35 03/08/2018 07:31 03/08/2018 11:29  Glucose-Capillary Latest Ref Range: 70 - 99 mg/dL 782183 (H) 956268 (H) 213312 (H) 356 (H) 277 (H) 240 (H)    Review of Glycemic Control  Diabetes history: DM 2 Outpatient Diabetes medications: Levemir 35 units, Amaryl 2 mg Daily, Novolog SSI Current orders for Inpatient glycemic control: Levemir 30 units qhs, Novolog 0-15 units tid, Novolog 0-5 units qhs, Novolog 3 units tid meal coverage  Inpatient Diabetes Program Recommendations:    Patient receiving PO prednisone 40 mg Daily.  Glucose trends elevated this am. Consider increasing Levemir to home dose 35 units, Also consider increasing meal coverage to Novolog 5 units tid if patient consumes at least 50% of meals.  Thanks,  Christena DeemShannon Mailynn Everly RN, MSN, BC-ADM Inpatient Diabetes Coordinator Team Pager [phone] (8a-5p)

## 2018-03-16 ENCOUNTER — Other Ambulatory Visit: Payer: Self-pay

## 2018-03-16 ENCOUNTER — Inpatient Hospital Stay (HOSPITAL_COMMUNITY)
Admission: EM | Admit: 2018-03-16 | Discharge: 2018-04-22 | DRG: 308 | Disposition: E | Payer: Medicare HMO | Attending: Pulmonary Disease | Admitting: Pulmonary Disease

## 2018-03-16 ENCOUNTER — Encounter (HOSPITAL_COMMUNITY): Payer: Self-pay | Admitting: Emergency Medicine

## 2018-03-16 ENCOUNTER — Emergency Department (HOSPITAL_COMMUNITY): Payer: Medicare HMO

## 2018-03-16 ENCOUNTER — Ambulatory Visit (HOSPITAL_COMMUNITY)
Admission: RE | Admit: 2018-03-16 | Discharge: 2018-03-16 | Disposition: A | Discharge status: Home / Self Care | Payer: Medicare HMO | Source: Ambulatory Visit | Attending: Pulmonary Disease | Admitting: Pulmonary Disease

## 2018-03-16 DIAGNOSIS — Z7901 Long term (current) use of anticoagulants: Secondary | ICD-10-CM

## 2018-03-16 DIAGNOSIS — I5082 Biventricular heart failure: Secondary | ICD-10-CM | POA: Diagnosis present

## 2018-03-16 DIAGNOSIS — J9611 Chronic respiratory failure with hypoxia: Secondary | ICD-10-CM

## 2018-03-16 DIAGNOSIS — R918 Other nonspecific abnormal finding of lung field: Secondary | ICD-10-CM | POA: Insufficient documentation

## 2018-03-16 DIAGNOSIS — I5032 Chronic diastolic (congestive) heart failure: Secondary | ICD-10-CM

## 2018-03-16 DIAGNOSIS — K219 Gastro-esophageal reflux disease without esophagitis: Secondary | ICD-10-CM | POA: Diagnosis present

## 2018-03-16 DIAGNOSIS — I10 Essential (primary) hypertension: Secondary | ICD-10-CM | POA: Diagnosis present

## 2018-03-16 DIAGNOSIS — E11649 Type 2 diabetes mellitus with hypoglycemia without coma: Secondary | ICD-10-CM | POA: Diagnosis present

## 2018-03-16 DIAGNOSIS — E871 Hypo-osmolality and hyponatremia: Secondary | ICD-10-CM | POA: Diagnosis present

## 2018-03-16 DIAGNOSIS — I248 Other forms of acute ischemic heart disease: Secondary | ICD-10-CM | POA: Diagnosis present

## 2018-03-16 DIAGNOSIS — R778 Other specified abnormalities of plasma proteins: Secondary | ICD-10-CM | POA: Diagnosis present

## 2018-03-16 DIAGNOSIS — N183 Chronic kidney disease, stage 3 unspecified: Secondary | ICD-10-CM | POA: Diagnosis present

## 2018-03-16 DIAGNOSIS — R570 Cardiogenic shock: Secondary | ICD-10-CM

## 2018-03-16 DIAGNOSIS — Z66 Do not resuscitate: Secondary | ICD-10-CM | POA: Diagnosis not present

## 2018-03-16 DIAGNOSIS — R0602 Shortness of breath: Secondary | ICD-10-CM

## 2018-03-16 DIAGNOSIS — J9621 Acute and chronic respiratory failure with hypoxia: Secondary | ICD-10-CM

## 2018-03-16 DIAGNOSIS — G253 Myoclonus: Secondary | ICD-10-CM | POA: Diagnosis not present

## 2018-03-16 DIAGNOSIS — Z87891 Personal history of nicotine dependence: Secondary | ICD-10-CM

## 2018-03-16 DIAGNOSIS — J9622 Acute and chronic respiratory failure with hypercapnia: Secondary | ICD-10-CM | POA: Diagnosis present

## 2018-03-16 DIAGNOSIS — Z9981 Dependence on supplemental oxygen: Secondary | ICD-10-CM

## 2018-03-16 DIAGNOSIS — I130 Hypertensive heart and chronic kidney disease with heart failure and stage 1 through stage 4 chronic kidney disease, or unspecified chronic kidney disease: Secondary | ICD-10-CM | POA: Diagnosis present

## 2018-03-16 DIAGNOSIS — R569 Unspecified convulsions: Secondary | ICD-10-CM | POA: Diagnosis not present

## 2018-03-16 DIAGNOSIS — D72829 Elevated white blood cell count, unspecified: Secondary | ICD-10-CM | POA: Diagnosis present

## 2018-03-16 DIAGNOSIS — E1122 Type 2 diabetes mellitus with diabetic chronic kidney disease: Secondary | ICD-10-CM | POA: Diagnosis present

## 2018-03-16 DIAGNOSIS — J449 Chronic obstructive pulmonary disease, unspecified: Secondary | ICD-10-CM

## 2018-03-16 DIAGNOSIS — I4891 Unspecified atrial fibrillation: Secondary | ICD-10-CM | POA: Diagnosis not present

## 2018-03-16 DIAGNOSIS — Z79899 Other long term (current) drug therapy: Secondary | ICD-10-CM | POA: Insufficient documentation

## 2018-03-16 DIAGNOSIS — Z7951 Long term (current) use of inhaled steroids: Secondary | ICD-10-CM

## 2018-03-16 DIAGNOSIS — I2781 Cor pulmonale (chronic): Secondary | ICD-10-CM

## 2018-03-16 DIAGNOSIS — I509 Heart failure, unspecified: Secondary | ICD-10-CM

## 2018-03-16 DIAGNOSIS — Z7189 Other specified counseling: Secondary | ICD-10-CM

## 2018-03-16 DIAGNOSIS — I272 Pulmonary hypertension, unspecified: Secondary | ICD-10-CM | POA: Diagnosis present

## 2018-03-16 DIAGNOSIS — I469 Cardiac arrest, cause unspecified: Secondary | ICD-10-CM

## 2018-03-16 DIAGNOSIS — I5033 Acute on chronic diastolic (congestive) heart failure: Secondary | ICD-10-CM | POA: Diagnosis present

## 2018-03-16 DIAGNOSIS — J8410 Pulmonary fibrosis, unspecified: Secondary | ICD-10-CM | POA: Diagnosis present

## 2018-03-16 DIAGNOSIS — N179 Acute kidney failure, unspecified: Secondary | ICD-10-CM

## 2018-03-16 DIAGNOSIS — E875 Hyperkalemia: Secondary | ICD-10-CM | POA: Diagnosis present

## 2018-03-16 DIAGNOSIS — E785 Hyperlipidemia, unspecified: Secondary | ICD-10-CM | POA: Diagnosis present

## 2018-03-16 DIAGNOSIS — G931 Anoxic brain damage, not elsewhere classified: Secondary | ICD-10-CM

## 2018-03-16 DIAGNOSIS — D638 Anemia in other chronic diseases classified elsewhere: Secondary | ICD-10-CM | POA: Diagnosis present

## 2018-03-16 DIAGNOSIS — I480 Paroxysmal atrial fibrillation: Secondary | ICD-10-CM | POA: Diagnosis not present

## 2018-03-16 DIAGNOSIS — Z515 Encounter for palliative care: Secondary | ICD-10-CM

## 2018-03-16 DIAGNOSIS — R7989 Other specified abnormal findings of blood chemistry: Secondary | ICD-10-CM | POA: Diagnosis present

## 2018-03-16 DIAGNOSIS — E119 Type 2 diabetes mellitus without complications: Secondary | ICD-10-CM

## 2018-03-16 DIAGNOSIS — Z978 Presence of other specified devices: Secondary | ICD-10-CM

## 2018-03-16 DIAGNOSIS — E8809 Other disorders of plasma-protein metabolism, not elsewhere classified: Secondary | ICD-10-CM | POA: Diagnosis not present

## 2018-03-16 DIAGNOSIS — Z8249 Family history of ischemic heart disease and other diseases of the circulatory system: Secondary | ICD-10-CM

## 2018-03-16 DIAGNOSIS — Z794 Long term (current) use of insulin: Secondary | ICD-10-CM

## 2018-03-16 DIAGNOSIS — I4892 Unspecified atrial flutter: Secondary | ICD-10-CM | POA: Diagnosis present

## 2018-03-16 HISTORY — DX: Chronic respiratory failure with hypercapnia: J96.12

## 2018-03-16 HISTORY — DX: Cor pulmonale (chronic): I27.81

## 2018-03-16 HISTORY — DX: Unspecified atrial fibrillation: I48.91

## 2018-03-16 HISTORY — DX: Chronic obstructive pulmonary disease, unspecified: J44.9

## 2018-03-16 HISTORY — DX: Chronic diastolic (congestive) heart failure: I50.32

## 2018-03-16 HISTORY — DX: Chronic kidney disease, unspecified: N18.9

## 2018-03-16 HISTORY — DX: Chronic respiratory failure with hypoxia: J96.11

## 2018-03-16 LAB — PULMONARY FUNCTION TEST
DL/VA % pred: 70 %
DL/VA: 3.24 ml/min/mmHg/L
DLCO UNC % PRED: 20 %
DLCO cor % pred: 23 %
DLCO cor: 7.55 ml/min/mmHg
DLCO unc: 6.57 ml/min/mmHg
FEF 25-75 Post: 2.47 L/sec
FEF 25-75 Pre: 2.02 L/sec
FEF2575-%Change-Post: 22 %
FEF2575-%PRED-POST: 121 %
FEF2575-%Pred-Pre: 99 %
FEV1-%CHANGE-POST: 5 %
FEV1-%PRED-PRE: 39 %
FEV1-%Pred-Post: 42 %
FEV1-Post: 1.23 L
FEV1-Pre: 1.16 L
FEV1FVC-%Change-Post: 0 %
FEV1FVC-%PRED-PRE: 124 %
FEV6-%Change-Post: 4 %
FEV6-%PRED-PRE: 33 %
FEV6-%Pred-Post: 35 %
FEV6-POST: 1.35 L
FEV6-Pre: 1.29 L
FEV6FVC-%PRED-POST: 107 %
FEV6FVC-%PRED-PRE: 107 %
FVC-%Change-Post: 4 %
FVC-%PRED-POST: 33 %
FVC-%PRED-PRE: 31 %
FVC-Post: 1.36 L
POST FEV6/FVC RATIO: 100 %
PRE FEV6/FVC RATIO: 100 %
Post FEV1/FVC ratio: 91 %
Pre FEV1/FVC ratio: 90 %

## 2018-03-16 LAB — CBC WITH DIFFERENTIAL/PLATELET
BASOS ABS: 0 10*3/uL (ref 0.0–0.1)
BASOS PCT: 0 %
EOS PCT: 2 %
Eosinophils Absolute: 0.2 10*3/uL (ref 0.0–0.7)
HCT: 38.7 % — ABNORMAL LOW (ref 39.0–52.0)
Hemoglobin: 11.9 g/dL — ABNORMAL LOW (ref 13.0–17.0)
Lymphocytes Relative: 17 %
Lymphs Abs: 2.3 10*3/uL (ref 0.7–4.0)
MCH: 28.1 pg (ref 26.0–34.0)
MCHC: 30.7 g/dL (ref 30.0–36.0)
MCV: 91.3 fL (ref 78.0–100.0)
Monocytes Absolute: 1.1 10*3/uL — ABNORMAL HIGH (ref 0.1–1.0)
Monocytes Relative: 8 %
NEUTROS ABS: 10 10*3/uL — AB (ref 1.7–7.7)
Neutrophils Relative %: 73 %
Platelets: 403 10*3/uL — ABNORMAL HIGH (ref 150–400)
RBC: 4.24 MIL/uL (ref 4.22–5.81)
RDW: 15.7 % — ABNORMAL HIGH (ref 11.5–15.5)
WBC: 13.7 10*3/uL — AB (ref 4.0–10.5)

## 2018-03-16 LAB — COMPREHENSIVE METABOLIC PANEL
ALBUMIN: 3.6 g/dL (ref 3.5–5.0)
ALT: 15 U/L (ref 0–44)
AST: 20 U/L (ref 15–41)
Alkaline Phosphatase: 71 U/L (ref 38–126)
Anion gap: 12 (ref 5–15)
BUN: 25 mg/dL — AB (ref 8–23)
CO2: 28 mmol/L (ref 22–32)
Calcium: 8.9 mg/dL (ref 8.9–10.3)
Chloride: 92 mmol/L — ABNORMAL LOW (ref 98–111)
Creatinine, Ser: 1.43 mg/dL — ABNORMAL HIGH (ref 0.61–1.24)
GFR calc Af Amer: 53 mL/min — ABNORMAL LOW (ref >60)
GFR calc non Af Amer: 45 mL/min — ABNORMAL LOW (ref >60)
GLUCOSE: 322 mg/dL — AB (ref 70–99)
Potassium: 3.6 mmol/L (ref 3.5–5.1)
Sodium: 132 mmol/L — ABNORMAL LOW (ref 135–145)
Total Bilirubin: 1 mg/dL (ref 0.3–1.2)
Total Protein: 7.8 g/dL (ref 6.5–8.1)

## 2018-03-16 LAB — TROPONIN I: TROPONIN I: 0.44 ng/mL — AB (ref <0.03)

## 2018-03-16 LAB — GLUCOSE, CAPILLARY: Glucose-Capillary: 264 mg/dL — ABNORMAL HIGH (ref 70–99)

## 2018-03-16 LAB — BRAIN NATRIURETIC PEPTIDE: B Natriuretic Peptide: 583 pg/mL — ABNORMAL HIGH (ref 0.0–100.0)

## 2018-03-16 MED ORDER — ALBUTEROL SULFATE (2.5 MG/3ML) 0.083% IN NEBU: 3.0000 mL | INHALATION_SOLUTION | Freq: Four times a day (QID) | RESPIRATORY_TRACT | Status: DC | PRN

## 2018-03-16 MED ORDER — HYDROCODONE-ACETAMINOPHEN 7.5-325 MG PO TABS
1.0000 | ORAL_TABLET | Freq: Four times a day (QID) | ORAL | Status: DC | PRN
  Administered 2018-03-17 – 2018-03-24 (×11): 1 via ORAL
  Filled 2018-03-16 (×11): qty 1

## 2018-03-16 MED ORDER — INSULIN ASPART 100 UNIT/ML ~~LOC~~ SOLN
5.0000 [IU] | Freq: Three times a day (TID) | SUBCUTANEOUS | Status: DC
  Administered 2018-03-17 – 2018-03-24 (×21): 5 [IU] via SUBCUTANEOUS

## 2018-03-16 MED ORDER — DILTIAZEM HCL-DEXTROSE 100-5 MG/100ML-% IV SOLN (PREMIX)
15.0000 mg/h | INTRAVENOUS | Status: DC
  Administered 2018-03-16: 15 mg/h via INTRAVENOUS

## 2018-03-16 MED ORDER — INSULIN ASPART 100 UNIT/ML ~~LOC~~ SOLN
0.0000 [IU] | Freq: Every day | SUBCUTANEOUS | Status: DC
  Administered 2018-03-16 – 2018-03-18 (×2): 3 [IU] via SUBCUTANEOUS
  Administered 2018-03-20 – 2018-03-22 (×2): 2 [IU] via SUBCUTANEOUS

## 2018-03-16 MED ORDER — DILTIAZEM HCL-DEXTROSE 100-5 MG/100ML-% IV SOLN (PREMIX)
5.0000 mg/h | INTRAVENOUS | Status: DC
  Administered 2018-03-16: 5 mg/h via INTRAVENOUS
  Administered 2018-03-16: 15 mg/h via INTRAVENOUS
  Filled 2018-03-16 (×2): qty 100

## 2018-03-16 MED ORDER — FUROSEMIDE 40 MG PO TABS: 40.0000 mg | ORAL_TABLET | Freq: Every day | ORAL | Status: DC

## 2018-03-16 MED ORDER — ACETAMINOPHEN 325 MG PO TABS
650.0000 mg | ORAL_TABLET | ORAL | Status: DC | PRN
  Administered 2018-03-24: 650 mg via ORAL
  Filled 2018-03-16: qty 2

## 2018-03-16 MED ORDER — FLUTICASONE PROPIONATE 50 MCG/ACT NA SUSP
1.0000 | Freq: Every day | NASAL | Status: DC | PRN
  Filled 2018-03-16: qty 16

## 2018-03-16 MED ORDER — ATORVASTATIN CALCIUM 40 MG PO TABS
40.0000 mg | ORAL_TABLET | Freq: Every evening | ORAL | Status: DC
  Filled 2018-03-16: qty 1

## 2018-03-16 MED ORDER — CARVEDILOL 12.5 MG PO TABS
25.0000 mg | ORAL_TABLET | Freq: Two times a day (BID) | ORAL | Status: DC
  Administered 2018-03-16: 25 mg via ORAL
  Filled 2018-03-16: qty 2

## 2018-03-16 MED ORDER — ALBUTEROL SULFATE (2.5 MG/3ML) 0.083% IN NEBU
2.5000 mg | INHALATION_SOLUTION | Freq: Once | RESPIRATORY_TRACT | Status: AC
  Administered 2018-03-16: 2.5 mg via RESPIRATORY_TRACT

## 2018-03-16 MED ORDER — PANTOPRAZOLE SODIUM 40 MG PO TBEC
80.0000 mg | DELAYED_RELEASE_TABLET | Freq: Every day | ORAL | Status: DC
  Administered 2018-03-17 – 2018-03-24 (×8): 80 mg via ORAL
  Filled 2018-03-16 (×8): qty 2

## 2018-03-16 MED ORDER — ADULT MULTIVITAMIN W/MINERALS CH
1.0000 | ORAL_TABLET | Freq: Every day | ORAL | Status: DC
  Administered 2018-03-17 – 2018-03-24 (×8): 1 via ORAL
  Filled 2018-03-16 (×8): qty 1

## 2018-03-16 MED ORDER — ONDANSETRON HCL 4 MG/2ML IJ SOLN
4.0000 mg | Freq: Four times a day (QID) | INTRAMUSCULAR | Status: DC | PRN
  Administered 2018-03-23 – 2018-03-24 (×2): 4 mg via INTRAVENOUS
  Filled 2018-03-16 (×2): qty 2

## 2018-03-16 MED ORDER — VITAMIN C 500 MG PO TABS
500.0000 mg | ORAL_TABLET | Freq: Every day | ORAL | Status: DC
  Administered 2018-03-17 – 2018-03-24 (×8): 500 mg via ORAL
  Filled 2018-03-16 (×8): qty 1

## 2018-03-16 MED ORDER — INSULIN GLARGINE 100 UNIT/ML ~~LOC~~ SOLN
35.0000 [IU] | Freq: Every day | SUBCUTANEOUS | Status: DC
  Administered 2018-03-17 – 2018-03-23 (×6): 35 [IU] via SUBCUTANEOUS
  Filled 2018-03-16 (×9): qty 0.35

## 2018-03-16 MED ORDER — INSULIN ASPART 100 UNIT/ML ~~LOC~~ SOLN
0.0000 [IU] | Freq: Three times a day (TID) | SUBCUTANEOUS | Status: DC
  Administered 2018-03-17: 2 [IU] via SUBCUTANEOUS
  Administered 2018-03-17: 5 [IU] via SUBCUTANEOUS
  Administered 2018-03-18: 3 [IU] via SUBCUTANEOUS
  Administered 2018-03-18: 2 [IU] via SUBCUTANEOUS
  Administered 2018-03-18: 5 [IU] via SUBCUTANEOUS
  Administered 2018-03-19: 2 [IU] via SUBCUTANEOUS
  Administered 2018-03-19: 5 [IU] via SUBCUTANEOUS
  Administered 2018-03-19: 3 [IU] via SUBCUTANEOUS
  Administered 2018-03-20: 11 [IU] via SUBCUTANEOUS
  Administered 2018-03-20: 3 [IU] via SUBCUTANEOUS
  Administered 2018-03-21: 5 [IU] via SUBCUTANEOUS
  Administered 2018-03-21: 2 [IU] via SUBCUTANEOUS
  Administered 2018-03-21: 3 [IU] via SUBCUTANEOUS
  Administered 2018-03-22: 5 [IU] via SUBCUTANEOUS
  Administered 2018-03-22 – 2018-03-23 (×2): 2 [IU] via SUBCUTANEOUS
  Administered 2018-03-23: 5 [IU] via SUBCUTANEOUS
  Administered 2018-03-24: 2 [IU] via SUBCUTANEOUS
  Administered 2018-03-24: 3 [IU] via SUBCUTANEOUS
  Administered 2018-03-25: 2 [IU] via SUBCUTANEOUS

## 2018-03-16 MED ORDER — SODIUM CHLORIDE 0.9 % IV SOLN
INTRAVENOUS | Status: DC
  Administered 2018-03-16: 18:00:00 via INTRAVENOUS

## 2018-03-16 MED ORDER — DILTIAZEM LOAD VIA INFUSION
10.0000 mg | Freq: Once | INTRAVENOUS | Status: AC
  Administered 2018-03-16: 10 mg via INTRAVENOUS
  Filled 2018-03-16: qty 10

## 2018-03-16 MED ORDER — INSULIN DETEMIR 100 UNIT/ML ~~LOC~~ SOLN
35.0000 [IU] | Freq: Every day | SUBCUTANEOUS | Status: DC
  Administered 2018-03-16: 35 [IU] via SUBCUTANEOUS
  Filled 2018-03-16: qty 0.35

## 2018-03-16 MED ORDER — IPRATROPIUM-ALBUTEROL 0.5-2.5 (3) MG/3ML IN SOLN: 3.0000 mL | Freq: Three times a day (TID) | RESPIRATORY_TRACT | Status: DC | PRN

## 2018-03-16 MED ORDER — ATORVASTATIN CALCIUM 40 MG PO TABS
40.0000 mg | ORAL_TABLET | Freq: Every evening | ORAL | Status: DC
Start: 2018-03-16 — End: 2018-03-25
  Administered 2018-03-16 – 2018-03-24 (×9): 40 mg via ORAL
  Filled 2018-03-16 (×8): qty 1

## 2018-03-16 MED ORDER — DULOXETINE HCL 60 MG PO CPEP
60.0000 mg | ORAL_CAPSULE | Freq: Every day | ORAL | Status: DC
  Administered 2018-03-17 – 2018-03-24 (×8): 60 mg via ORAL
  Filled 2018-03-16 (×8): qty 1

## 2018-03-16 MED ORDER — GLIMEPIRIDE 2 MG PO TABS: 2.0000 mg | ORAL_TABLET | Freq: Every day | ORAL | Status: DC

## 2018-03-16 MED ORDER — APIXABAN 5 MG PO TABS
5.0000 mg | ORAL_TABLET | Freq: Two times a day (BID) | ORAL | Status: DC
  Administered 2018-03-16 – 2018-03-24 (×17): 5 mg via ORAL
  Filled 2018-03-16 (×17): qty 1

## 2018-03-16 MED ORDER — DILTIAZEM HCL 25 MG/5ML IV SOLN
10.0000 mg | Freq: Once | INTRAVENOUS | Status: AC
  Administered 2018-03-16: 10 mg via INTRAVENOUS
  Filled 2018-03-16: qty 5

## 2018-03-16 NOTE — ED Triage Notes (Signed)
Pt reports was discharged from [facility] last week after being treated for shortness of breath. Pt reports was sent here from Dr. Juanetta Gosling office for tachycardia. Pt denies pain. Pt reports wears home o2 all the time ranging from 3-5 liters of oxygen.

## 2018-03-16 NOTE — ED Provider Notes (Addendum)
[facility] EMERGENCY DEPARTMENT Provider Note   CSN: [id number] Arrival date & time: 03/09/2018  1712     History   Chief Complaint Chief Complaint  Patient presents with  . Tachycardia    HPI BYNUM MCCULLARS is a 77 y.o. male.  Patient sent over from Dr. Juanetta Gosling office for rapid heart rate.  EKG officially read by computer as a sinus tachycardia but I think it is atrial fibrillation flutter underlying.  Patient has had routine office visit with Dr. Juanetta Gosling.  Patient states he is felt as if the heart's been intermittently in a fast rate on and off for the since he has been discharged on the 17th.  Patient had new onset of atrial fib during his admission.  Patient started on Cardizem and started on Eliquis.  At that time.  Patient denies any shortness of breath.  Patient is normally on 3 to 6 L of oxygen daily.  Patient state he has had some chest discomfort with this on and off for the past week.     Past Medical History:  Diagnosis Date  . Essential hypertension   . GERD (gastroesophageal reflux disease)   . Hyperlipidemia   . Type 2 diabetes mellitus [facility])     Patient Active Problem List   Diagnosis Date Noted  . Chronic respiratory failure with hypoxia (HCC) 03/07/2018  . Atrial fibrillation with RVR (HCC) 03/07/2018  . Acute on chronic diastolic CHF (congestive heart failure) (HCC) 03/05/2018  . Elevated troponin 03/04/2018  . Diabetes mellitus without complication (HCC)   . Acid reflux   . Acute on chronic respiratory failure with hypoxia and hypercapnia (HCC) 02/06/2018  . COPD exacerbation (HCC) 02/06/2018  . Acute CHF (congestive heart failure) (HCC) 02/06/2018  . CKD (chronic kidney disease) stage 3, GFR 30-59 ml/min (HCC) 02/06/2018  . Essential hypertension 02/06/2018    Past Surgical History:  Procedure Laterality Date  . COLON SURGERY    . ROTATOR CUFF REPAIR          Home Medications    Prior to Admission medications   Medication Sig Start Date  End Date Taking? Authorizing Provider  albuterol (PROVENTIL HFA;VENTOLIN HFA) 108 (90 Base) MCG/ACT inhaler Inhale 2 puffs into the lungs every 6 (six) hours as needed for wheezing or shortness of breath. 03/08/18  Yes Tat, Onalee Hua, MD  apixaban (ELIQUIS) 5 MG TABS tablet Take 1 tablet (5 mg total) by mouth 2 (two) times daily. 03/08/18  Yes Tat, Onalee Hua, MD  atorvastatin (LIPITOR) 40 MG tablet Take 1 tablet by mouth every evening.    Yes [provider]  carvedilol (COREG) 25 MG tablet Take 25 mg by mouth 2 (two) times daily.  01/19/14  Yes [provider]  Cyanocobalamin (VITAMIN B-12 PO) Take 1 tablet by mouth daily.   Yes [provider]  diltiazem (CARDIZEM CD) 120 MG 24 hr capsule Take 1 capsule (120 mg total) by mouth daily. 03/08/18  Yes Tat, Onalee Hua, MD  DULoxetine (CYMBALTA) 60 MG capsule Take 60 mg by mouth daily.   Yes [provider]  fluticasone (FLONASE) 50 MCG/ACT nasal spray Place 1-2 sprays into both nostrils daily as needed for allergies or rhinitis.   Yes [provider]  furosemide (LASIX) 40 MG tablet Take 1 tablet (40 mg total) by mouth daily. 03/09/18  Yes Tat, Onalee Hua, MD  glimepiride (AMARYL) 2 MG tablet Take 1 tablet by mouth daily. 03/05/14  Yes [provider]  HYDROcodone-acetaminophen (NORCO) 7.5-325 MG tablet Take  1 tablet by mouth every 6 (six) hours as needed for moderate pain.   Yes [provider]  insulin aspart (NOVOLOG) 100 UNIT/ML injection Inject 5 Units into the skin 3 (three) times daily before meals.    Yes [provider]  ipratropium-albuterol (DUONEB) 0.5-2.5 (3) MG/3ML SOLN Inhale 3 mLs into the lungs 3 (three) times daily as needed (for shortness of breath/wheezing).  02/15/18  Yes [provider]  LEVEMIR FLEXTOUCH 100 UNIT/ML Pen Inject 35 Units into the skin at bedtime.  02/20/18  Yes [provider]  Multiple Vitamin (MULTIVITAMIN WITH MINERALS) TABS tablet Take 1 tablet by  mouth daily.   Yes [provider]  omeprazole (PRILOSEC) 40 MG capsule Take 1 capsule by mouth daily.  01/19/14  Yes [provider]  vitamin C (ASCORBIC ACID) 500 MG tablet Take 500 mg by mouth daily.   Yes [provider]  predniSONE (DELTASONE) 20 MG tablet Take 2 tablets (40 mg total) by mouth daily with breakfast. X 3 days. Then 1 tablet (20 mg) daily x 3 days Patient not taking: Reported on 03/20/2018 03/09/18   Catarina Hartshorn, MD  sildenafil (REVATIO) 20 MG tablet Take 1 tablet (20 mg total) by mouth 3 (three) times daily. 03/08/18   Catarina Hartshorn, MD    Family History Family History  Problem Relation Age of Onset  . Hypertension Father     Social History Social History   Tobacco Use  . Smoking status: Former Smoker    Types: Cigarettes  . Smokeless tobacco: Never Used  Substance Use Topics  . Alcohol use: No  . Drug use: No     Allergies   Patient has no known allergies.   Review of Systems Review of Systems  Constitutional: Negative for fever.  HENT: Negative for congestion.   Eyes: Negative for redness.  Respiratory: Negative for shortness of breath.   Cardiovascular: Positive for chest pain and palpitations.  Gastrointestinal: Negative for abdominal pain.  Genitourinary: Negative for dysuria.  Musculoskeletal: Negative for back pain.  Skin: Negative for rash.  Neurological: Negative for syncope and headaches.  Hematological: Bruises/bleeds easily.  Psychiatric/Behavioral: Negative for confusion.     Physical Exam Updated Vital Signs BP 116/88   Pulse (!) 143   Temp 97.6 F (36.4 C) (Oral)   Resp (!) 24   Ht 1.778 m (5\' 10" )   Wt 89.7 kg   SpO2 95%   BMI 28.37 kg/m   Physical Exam  Constitutional: He is oriented to person, place, and time. He appears well-developed and well-nourished. No distress.  HENT:  Head: Normocephalic and atraumatic.  Mouth/Throat: Oropharynx is clear and moist.  Eyes: Pupils are equal, round, and  reactive to light. Conjunctivae and EOM are normal.  Neck: Normal range of motion. Neck supple.  Cardiovascular: Normal heart sounds.  Rapid irregular rate  Pulmonary/Chest: Effort normal and breath sounds normal. No respiratory distress.  Abdominal: Soft. Bowel sounds are normal. There is no tenderness.  Musculoskeletal: Normal range of motion.  Neurological: He is alert and oriented to person, place, and time. No cranial nerve deficit. He exhibits normal muscle tone. Coordination normal.  Skin: Skin is warm.  Nursing note and vitals reviewed.    ED Treatments / Results  Labs (all labs ordered are listed, but only abnormal results are displayed) Labs Reviewed  CBC WITH DIFFERENTIAL/PLATELET - Abnormal; Notable for the following components:      Result Value   WBC 13.7 (*)    Hemoglobin  11.9 (*)    HCT 38.7 (*)    RDW 15.7 (*)    Platelets 403 (*)    Neutro Abs 10.0 (*)    Monocytes Absolute 1.1 (*)    All other components within normal limits  COMPREHENSIVE METABOLIC PANEL - Abnormal; Notable for the following components:   Sodium 132 (*)    Chloride 92 (*)    Glucose, Bld 322 (*)    BUN 25 (*)    Creatinine, Ser 1.43 (*)    GFR calc non Af Amer 45 (*)    GFR calc Af Amer 53 (*)    All other components within normal limits  BRAIN NATRIURETIC PEPTIDE - Abnormal; Notable for the following components:   B Natriuretic Peptide 583.0 (*)    All other components within normal limits  TROPONIN I - Abnormal; Notable for the following components:   Troponin I 0.44 (*)    All other components within normal limits    EKG None   ED ECG REPORT   Date: 03/13/2018  Rate: 141  Rhythm: atrial fibrillation  QRS Axis: normal  Intervals: normal  ST/T Wave abnormalities: nonspecific ST/T changes  Conduction Disutrbances:right bundle branch block  Narrative Interpretation:   Old EKG Reviewed: none available  I have personally reviewed the EKG tracing and agree with the  computerized printout as noted.    Radiology Dg Chest Port 1 View  Result Date: 02/20/2018 CLINICAL DATA:  Shortness of breath for 2 weeks, tachycardia for 1 week. History of COPD and CHF. EXAM: PORTABLE CHEST 1 VIEW COMPARISON:  Chest radiograph March 04, 2018 FINDINGS: Stable at least moderate cardiomegaly, partially obscured by diffuse interstitial prominence, alveolar airspace opacities. Small pleural effusions. Mediastinal silhouette is not suspicious, calcified aortic arch. Fullness of the pulmonary hila unchanged. No pneumothorax. Suture anchors bilateral humeral heads. IMPRESSION: Stable cardiomegaly. Diffuse interstitial prominence, potentially chronic. Similar bibasilar airspace opacities and small pleural effusions. Aortic Atherosclerosis (ICD10-I70.0). Electronically Signed   By: Awilda Metro M.D.   On: 02/24/2018 18:28    Procedures Procedures (including critical care time)  CRITICAL CARE Performed by: Vanetta Mulders Total critical care time: 60 minutes Critical care time was exclusive of separately billable procedures and treating other patients. Critical care was necessary to treat or prevent imminent or life-threatening deterioration. Critical care was time spent personally by me on the following activities: development of treatment plan with patient and/or surrogate as well as nursing, discussions with consultants, evaluation of patient's response to treatment, examination of patient, obtaining history from patient or surrogate, ordering and performing treatments and interventions, ordering and review of laboratory studies, ordering and review of radiographic studies, pulse oximetry and re-evaluation of patient's condition.   Medications Ordered in ED Medications  0.9 %  sodium chloride infusion ( Intravenous Transfusing/Transfer 02/22/2018 2133)  diltiazem (CARDIZEM) 1 mg/mL load via infusion 10 mg (10 mg Intravenous Bolus from Bag 03/21/2018 1754)    And  diltiazem  (CARDIZEM) 100 mg in dextrose 5% (1 mg/mL) infusion (15 mg/hr Intravenous Rate/Dose Change 03/03/2018 1822)  diltiazem (CARDIZEM) 100 mg in dextrose 5% (1 mg/mL) infusion (15 mg/hr Intravenous Transfusing/Transfer 03/06/2018 2133)  diltiazem (CARDIZEM) injection 10 mg (10 mg Intravenous Given 03/18/2018 2007)     Initial Impression / Assessment and Plan / ED Course  I have reviewed the triage vital signs and the nursing notes.  Pertinent labs & imaging results that were available during my care of the patient were reviewed by me and considered in  my medical decision making (see chart for details).    Patient presenting with atrial fib flutter type arrhythmia with rapid ventricular rate around 140.  Referred over from Dr. Juanetta Gosling office.  Patient states this is been ongoing on and off for about a week since he was discharged on the 17th.  Patient is on Eliquis.  Patient had an onset of this during his most recent hospitalization.  Started on Eliquis then treated with diltiazem CD which she is been taking.  But he says is not holding.  Initially treated with diltiazem bolus 10 mg and then a drip which was titrated.  He was titrated up to 15 mg without any significant break in the atrial fib flutter pattern.  Sometimes he be down to 118 oh at times he back up to 140.  Patient in no distress.  Patient was a candidate for cardioversion so did contact cardiology.  Discussed with Dr. Donato Schultz from cardiology he did not recommend cardioverting.  State based on patient's multiple other medical problems he did rate control skin to be difficult for him and he would probably go back into fast rhythm.  I recommended giving an additional bolus of 10 mg of cardia exam and continuing the the drip admission up here and cardiology will see in the morning.  Patient troponin was up at 0.4.  This is higher than when he was in the hospital this is probably due to some rate related ischemia.  Patient without any  wheezing here.  Patient normally on 3 to 6 L of oxygen comfortable on 3 L here.  Chest x-ray without any significant changes from his last admission.  Discussed with hospitalist who will admit here.   Final Clinical Impressions(s) / ED Diagnoses   Final diagnoses:  Atrial fibrillation with rapid ventricular response [facility])    ED Discharge Orders    None       Vanetta Mulders, MD 18-Mar-2018 Babette Relic    Vanetta Mulders, MD 03-18-2018 2145

## 2018-03-16 NOTE — ED Notes (Signed)
CRITICAL VALUE ALERT  Critical Value:  0.44  Date & Time Notied:  10/09/17 1845  Provider Notified: Dr Deretha EmoryZackowski  Orders Received/Actions taken:

## 2018-03-17 ENCOUNTER — Encounter (HOSPITAL_COMMUNITY): Payer: Self-pay | Admitting: Internal Medicine

## 2018-03-17 DIAGNOSIS — I5032 Chronic diastolic (congestive) heart failure: Secondary | ICD-10-CM | POA: Diagnosis not present

## 2018-03-17 DIAGNOSIS — I4891 Unspecified atrial fibrillation: Secondary | ICD-10-CM | POA: Diagnosis not present

## 2018-03-17 DIAGNOSIS — J9611 Chronic respiratory failure with hypoxia: Secondary | ICD-10-CM | POA: Diagnosis not present

## 2018-03-17 DIAGNOSIS — J449 Chronic obstructive pulmonary disease, unspecified: Secondary | ICD-10-CM

## 2018-03-17 DIAGNOSIS — N183 Chronic kidney disease, stage 3 (moderate): Secondary | ICD-10-CM | POA: Diagnosis not present

## 2018-03-17 DIAGNOSIS — I2781 Cor pulmonale (chronic): Secondary | ICD-10-CM

## 2018-03-17 LAB — BASIC METABOLIC PANEL
Anion gap: 10 (ref 5–15)
BUN: 36 mg/dL — AB (ref 8–23)
CHLORIDE: 94 mmol/L — AB (ref 98–111)
CO2: 27 mmol/L (ref 22–32)
CREATININE: 2.28 mg/dL — AB (ref 0.61–1.24)
Calcium: 8.5 mg/dL — ABNORMAL LOW (ref 8.9–10.3)
GFR calc non Af Amer: 26 mL/min — ABNORMAL LOW (ref >60)
GFR, EST AFRICAN AMERICAN: 30 mL/min — AB (ref >60)
Glucose, Bld: 132 mg/dL — ABNORMAL HIGH (ref 70–99)
Potassium: 4.3 mmol/L (ref 3.5–5.1)
Sodium: 131 mmol/L — ABNORMAL LOW (ref 135–145)

## 2018-03-17 LAB — CBC WITH DIFFERENTIAL/PLATELET
Basophils Absolute: 0 10*3/uL (ref 0.0–0.1)
Basophils Relative: 0 %
Eosinophils Absolute: 0.2 10*3/uL (ref 0.0–0.7)
Eosinophils Relative: 2 %
HEMATOCRIT: 35.1 % — AB (ref 39.0–52.0)
Hemoglobin: 10.7 g/dL — ABNORMAL LOW (ref 13.0–17.0)
LYMPHS ABS: 2 10*3/uL (ref 0.7–4.0)
LYMPHS PCT: 18 %
MCH: 27.6 pg (ref 26.0–34.0)
MCHC: 30.5 g/dL (ref 30.0–36.0)
MCV: 90.5 fL (ref 78.0–100.0)
MONOS PCT: 9 %
Monocytes Absolute: 1 10*3/uL (ref 0.1–1.0)
NEUTROS ABS: 8 10*3/uL — AB (ref 1.7–7.7)
NEUTROS PCT: 71 %
Platelets: 328 10*3/uL (ref 150–400)
RBC: 3.88 MIL/uL — ABNORMAL LOW (ref 4.22–5.81)
RDW: 15.4 % (ref 11.5–15.5)
WBC: 11.2 10*3/uL — ABNORMAL HIGH (ref 4.0–10.5)

## 2018-03-17 LAB — TSH: TSH: 1.694 u[IU]/mL (ref 0.350–4.500)

## 2018-03-17 LAB — GLUCOSE, CAPILLARY
Glucose-Capillary: 247 mg/dL — ABNORMAL HIGH (ref 70–99)
Glucose-Capillary: 114 mg/dL — ABNORMAL HIGH (ref 70–99)
Glucose-Capillary: 132 mg/dL — ABNORMAL HIGH (ref 70–99)
Glucose-Capillary: 141 mg/dL — ABNORMAL HIGH (ref 70–99)

## 2018-03-17 LAB — TROPONIN I
Troponin I: 0.6 ng/mL (ref <0.03)
Troponin I: 0.63 ng/mL (ref <0.03)

## 2018-03-17 LAB — T4, FREE: FREE T4: 1.34 ng/dL (ref 0.82–1.77)

## 2018-03-17 MED ORDER — SODIUM CHLORIDE 0.9 % IV BOLUS
250.0000 mL | Freq: Once | INTRAVENOUS | Status: AC
  Administered 2018-03-17: 250 mL via INTRAVENOUS

## 2018-03-17 MED ORDER — DILTIAZEM HCL ER COATED BEADS 120 MG PO CP24
120.0000 mg | ORAL_CAPSULE | Freq: Every day | ORAL | Status: DC
  Administered 2018-03-17 – 2018-03-21 (×5): 120 mg via ORAL
  Filled 2018-03-17 (×5): qty 1

## 2018-03-17 MED ORDER — LEVALBUTEROL HCL 0.63 MG/3ML IN NEBU
0.6300 mg | INHALATION_SOLUTION | Freq: Four times a day (QID) | RESPIRATORY_TRACT | Status: DC
  Administered 2018-03-17 – 2018-03-18 (×7): 0.63 mg via RESPIRATORY_TRACT
  Filled 2018-03-17 (×7): qty 3

## 2018-03-17 NOTE — Progress Notes (Signed)
Paged Dr. Thayer Dallas about pts BP being low. She said to stop cardizem drip since HR is aflutter at 69 and see if BP will rebound on its own before giving a bolus. Pt said he dosen't have any symptoms of low BP. Will continue to monitor.

## 2018-03-17 NOTE — Care Management (Signed)
Patient Information   Patient Name Koury, Roddy ([id number]) Sex Male DOB 05/05/1941  Room Bed  IC02 IC02-01  Patient Demographics   Address [address] [place] [address] Phone [phone] [facility]) [phone] (Mobile)  Patient Ethnicity & Race   Ethnic Group Patient Race  Not Hispanic or Latino White or Caucasian  Emergency Contact(s)   Name Relation Home Work [place] Son [phone]    LABRADFORD, SCHNITKER [phone]  [phone]  Documents on File    Status Date Received Description  Documents for the Patient  [place] HIPAA NOTICE OF PRIVACY - Scanned Not Received    [facility] E-Signature HIPAA Notice of Privacy Received 03/11/14   Driver's License Not Received  05/2022  Insurance Card Received 03/11/14 HUMANA/MEDICARE  Advance Directives/Living Will/HCPOA/POA Not Received    Other Photo ID Not Received    Insurance Card Received 02/06/18 Monia Pouch mcr  Insurance Card Received 03/04/18 New Medicare Card/ Aetna  Patient Photo   Photo of Patient  Patient Photo   Photo of Patient  Documents for the Encounter  AOB (Assignment of Insurance Benefits) Not Received    E-signature AOB Signed 03/03/2018   MEDICARE RIGHTS Not Received    E-signature Medicare Rights Signed 03/04/2018   ED Patient Billing Extract   ED PB Billing Extract  Cardiac Monitoring Strip Shift Summary Received 03/11/2018   Medicare Observation  03/17/18   EKG Received 03/17/18   Admission Information   Attending Provider Admitting Provider Admission Type Admission Date/Time  Briant Cedar, MD Synetta Fail, MD Emergency 02/25/2018 1716  Discharge Date Hospital Service Auth/Cert Status Service Area   Internal Medicine Incomplete [facility]  Unit Room/Bed Admission Status   AP-ICCUP NURSING IC02/IC02-01 Admission (Confirmed)   Admission   Complaint  rapid heart beat  Hospital Account   Name Acct ID Class Status Primary Coverage  Troi, [place] [id number]  Observation Open AETNA MEDICARE - AETNA MEDICARE HMO/PPO      Guarantor Account (for Hospital Account [id number])   Name Relation to Pt Service Area Active? Acct Type  [name] Self [facility]SA Yes Personal/Family  Address Phone    [address] [place], [place] [address] [phone](H)        Coverage Information (for Hospital Account [id number])   F/O Payor/Plan Precert #  AETNA MEDICARE/AETNA MEDICARE HMO/PPO   Subscriber Subscriber #  Jakorian, [place] [facility]  Address Phone  [address] [place], [place] [address] [phone]

## 2018-03-17 NOTE — Progress Notes (Signed)
PROGRESS NOTE  Mario Walker ZOX:[id number] DOB: 1940-09-10 DOA: 03/31/18 PCP: Craig Staggers, MD  HPI/Recap of past 24 hours: HPI from [facility] Mario Walker is a 77 y.o. male with medical history significant for COPD, chronic respiratory failure on home O2 5-6L, A. fib, type 2 diabetes, presents with tachycardia, SOB, chest discomfort. He went to Dr. Juanetta Gosling office on March 31, 2018 for follow-up visit and was noted to be tachycardic and was sent over to the ED. In the ED, pt was found to be in RVR. Patient reported since his discharge, he has noted palpitations, SOB and some intermittent chest tightness. Of note, patient was admitted on September 13 for CHF and new onset A. fib. Pt was discharged on p.o. Cardizem and Eliquis of which he reports compliance. EDP consulted cardiology, will see in am. Pt admitted for further management. He he states compliance with his medications.     Overnight, pt HR was controlled on diltiazem drip but noted to be hypotensive. Pt was given 250cc bolus and drip was stopped. Today, pt reported feeling somewhat better, now requiring about 6L of O2. Pt still reports some chest discomfort, SOB. Pt denies any abdominal pain, N/V, fever/chills, diarrhea.    Assessment/Plan: Principal Problem:   Atrial fibrillation with RVR (HCC) Active Problems:   CKD (chronic kidney disease) stage 3, GFR 30-59 ml/min (HCC)   Essential hypertension   Diabetes mellitus without complication (HCC)   Elevated troponin   Chronic respiratory failure with hypoxia (HCC)   COPD (chronic obstructive pulmonary disease) (HCC)   Chronic diastolic CHF (congestive heart failure) (HCC)   Cor pulmonale (chronic) (HCC)  Paroxysmal Afib with RVR On presentation, HR 150s, currently rate fairly controlled EKG with Afib/flutter S/P diltiazem drip, now on home PO cardizem  TSH, free T4 pending ECHO done on 01/2018, showed EF of 55 to 60%, with diastolic dysfunction, PA peak pressure of 65  mmHg Continue Eliquis, PO Cardizem Cardiology consulted  Elevated troponin/?demand ischemia Reports mild chest tightness Trop 0.44-->0.60-->0.63 EKG on presentation Aflutter with RVR Cardiology consulted  Acute on chronic hypoxic respiratory failure/COPD/Pulm HTN Noted to be hypoxic on 6 L of O2 while sleeping, currently improving Afebrile with leukocytosis, denies any cough/no wheezing Chest x-ray with no acute changes Continue duo nebs, Flonase, supplemental O2 Not on any home inhalers, may need Spiriva, Symbicort on discharge Follows with Dr. Juanetta Gosling as an outpatient  Chronic diastolic HF BNP elevated from previous, likely due to ?Afib CXR and ECHO as above Held lasix, coreg due to soft BP, please restart as per BP Strict I&O, daily weight  Type 2 diabetes mellitus Controlled, last A1c 6.6 Continue SSI, lantus, accu-checks  Hypertension BP currently soft Held home lasix, coreg  CKD stage III At baseline Daily BMP    Code Status: Partial code  Family Communication: Wife at bedside  Disposition Plan: Once work-up complete   Consultants:  Cardiology  Procedures:  None  Antimicrobials:  None  DVT prophylaxis: Eliquis   Objective: Vitals:   03/17/18 0700 03/17/18 0719 03/17/18 0853 03/17/18 0936  BP: (!) 95/56   109/64  Pulse: 80 86    Resp: (!) 22 20    Temp:  98.3 F (36.8 C)    TempSrc:  Oral    SpO2: (!) 89% (!) 87% 92%   Weight:      Height:        Intake/Output Summary (Last 24 hours) at 03/17/2018 1140 Last data filed at 03/17/2018 0715 Gross per 24 hour  Intake 207.22 ml  Output -  Net 207.22 ml   Filed Weights   03/06/2018 1720 03/19/2018 2154 03/17/18 0500  Weight: 89.7 kg 89.3 kg 89.3 kg    Exam:   General: Mild distress  Cardiovascular: S1, S2 present, irregular irregular  Respiratory: Diminished breath sounds bilaterally  Abdomen: Soft, nontender, nondistended, bowel sounds present  Musculoskeletal: No pedal edema  bilaterally  Skin: Normal  Psychiatry: Normal mood   Data Reviewed: CBC: Recent Labs  Lab 03/02/2018 1758  WBC 13.7*  NEUTROABS 10.0*  HGB 11.9*  HCT 38.7*  MCV 91.3  PLT 403*   Basic Metabolic Panel: Recent Labs  Lab 02/27/2018 1758  NA 132*  K 3.6  CL 92*  CO2 28  GLUCOSE 322*  BUN 25*  CREATININE 1.43*  CALCIUM 8.9   GFR: Estimated Creatinine Clearance: 44.6 mL/min (A) (by C-G formula based on SCr of 1.43 mg/dL (H)). Liver Function Tests: Recent Labs  Lab 03/14/2018 1758  AST 20  ALT 15  ALKPHOS 71  BILITOT 1.0  PROT 7.8  ALBUMIN 3.6   No results for input(s): LIPASE, AMYLASE in the last 168 hours. No results for input(s): AMMONIA in the last 168 hours. Coagulation Profile: No results for input(s): INR, PROTIME in the last 168 hours. Cardiac Enzymes: Recent Labs  Lab 02/21/2018 1758 03/17/18 0005 03/17/18 0310  TROPONINI 0.44* 0.60* 0.63*   BNP (last 3 results) No results for input(s): PROBNP in the last 8760 hours. HbA1C: No results for input(s): HGBA1C in the last 72 hours. CBG: Recent Labs  Lab 03/03/2018 2244 03/17/18 0718  GLUCAP 264* 247*   Lipid Profile: No results for input(s): CHOL, HDL, LDLCALC, TRIG, CHOLHDL, LDLDIRECT in the last 72 hours. Thyroid Function Tests: No results for input(s): TSH, T4TOTAL, FREET4, T3FREE, THYROIDAB in the last 72 hours. Anemia Panel: No results for input(s): VITAMINB12, FOLATE, FERRITIN, TIBC, IRON, RETICCTPCT in the last 72 hours. Urine analysis:    Component Value Date/Time   COLORURINE STRAW (A) 02/06/2018 1415   APPEARANCEUR CLEAR 02/06/2018 1415   LABSPEC 1.006 02/06/2018 1415   PHURINE 7.0 02/06/2018 1415   GLUCOSEU NEGATIVE 02/06/2018 1415   HGBUR NEGATIVE 02/06/2018 1415   BILIRUBINUR NEGATIVE 02/06/2018 1415   KETONESUR NEGATIVE 02/06/2018 1415   PROTEINUR NEGATIVE 02/06/2018 1415   NITRITE NEGATIVE 02/06/2018 1415   LEUKOCYTESUR NEGATIVE 02/06/2018 1415   Sepsis  Labs: @LABRCNTIP (procalcitonin:4,lacticidven:4)  )No results found for this or any previous visit (from the past 240 hour(s)).    Studies: Dg Chest Port 1 View  Result Date: 03/19/2018 CLINICAL DATA:  Shortness of breath for 2 weeks, tachycardia for 1 week. History of COPD and CHF. EXAM: PORTABLE CHEST 1 VIEW COMPARISON:  Chest radiograph March 04, 2018 FINDINGS: Stable at least moderate cardiomegaly, partially obscured by diffuse interstitial prominence, alveolar airspace opacities. Small pleural effusions. Mediastinal silhouette is not suspicious, calcified aortic arch. Fullness of the pulmonary hila unchanged. No pneumothorax. Suture anchors bilateral humeral heads. IMPRESSION: Stable cardiomegaly. Diffuse interstitial prominence, potentially chronic. Similar bibasilar airspace opacities and small pleural effusions. Aortic Atherosclerosis (ICD10-I70.0). Electronically Signed   By: Awilda Metro M.D.   On: 03/11/2018 18:28    Scheduled Meds: . apixaban  5 mg Oral BID  . atorvastatin  40 mg Oral QPM  . diltiazem  120 mg Oral Daily  . DULoxetine  60 mg Oral Daily  . insulin aspart  0-15 Units Subcutaneous TID WC  . insulin aspart  0-5 Units Subcutaneous QHS  . insulin aspart  5 Units Subcutaneous TID AC  . insulin glargine  35 Units Subcutaneous QHS  . levalbuterol  0.63 mg Nebulization Q6H  . multivitamin with minerals  1 tablet Oral Daily  . pantoprazole  80 mg Oral Daily  . vitamin C  500 mg Oral Daily    Continuous Infusions: . diltiazem (CARDIZEM) infusion Stopped (03/17/18 0342)  . diltiazem (CARDIZEM) infusion Stopped (03/11/2018 2239)     LOS: 0 days     Briant Cedar, MD [facility]   If 7PM-7AM, please contact night-coverage [email or web address] 03/17/2018, 11:40 AM

## 2018-03-17 NOTE — Care Management Note (Signed)
Case Management Note  Patient Details  Name: Mario Walker MRN: [id number] Date of Birth: 07-13-1940  Subjective/Objective:    Admitted with Afib with RVR. Pt from home, lives with wife. Pt ind with ADL's. Has insurance with drug coverage and PCP. PT has neb machine and home O2 pta. Patient wearing 5-6 liters at home now. Verified this with [facility], who states patient has capacity to go up to 10 liters at home now. Will notify patient. CM asked [facility] to make a home visit to make sure patient has 10 liter capacity. Will send order for oxygen in case they need to switch out concentrators.             Action/Plan: DC home.   Expected Discharge Date:     03/18/2018             Expected Discharge Plan:  Home/Self Care  In-House Referral:     Discharge planning Services  CM Consult  Post Acute Care Choice:  Durable Medical Equipment Choice offered to:  Patient  DME Arranged:  Oxygen DME Agency:  Other - Comment([facility])  HH Arranged:    HH Agency:     Status of Service:  Completed, signed off  If discussed at Long Length of Stay Meetings, dates discussed:    Additional Comments:  Milly Goggins, Chrystine Oiler, RN 03/17/2018, 12:21 PM

## 2018-03-17 NOTE — H&P (Signed)
History and Physical    Mario Walker:[id number] DOB: June 23, 1940 DOA: 02/26/2018  PCP: Craig Staggers, MD  Patient coming from: Dr Juanetta Gosling office  I have personally briefly reviewed patient's old medical records in [facility]  Chief Complaint: SOB and palpitations  HPI: Mario Walker is a 77 y.o. male with medical history significant of *COPD, chronic respiratory failure, A. fib, type 2 diabetes, presents with tachycardia.  He went to Dr. Juanetta Gosling office today for follow-up visit.  He was found to be tachycardic.  He was sent over to the ED.  He was found to be in RVR. States he also has some dyspnea on exertion when his palpitations occur.  Patient denies any chest pain.  Was admitted on September 13 for CHF and new onset A. fib.  He was discharged on p.o. Cardizem and Eliquis.  He he states compliance with his medications.    ED Course: *ED physician consulted cardiology Dr. Donato Schultz who recommended continue on diltiazem drip.  He did get additional bolus of Cardizem.  Heart rate from the 1 teens to the 130 ranges.  he has a chronically elevated troponin however troponin today was increased from his baseline.  Chronic kidney disease at baseline.  Chest x-ray showed small bilateral effusions.  Patient denied any cough or fever.  Review of Systems: Positive for palpitations and shortness of breath associated with his tachycardia All others reviewed with patient  and are  negative unless otherwise stated   Past Medical History:  Diagnosis Date  . Essential hypertension   . GERD (gastroesophageal reflux disease)   . Hyperlipidemia   . Type 2 diabetes mellitus (HCC)   Atrial fibrillation with RVR (HCC) Active Problems:   CKD (chronic kidney disease) stage 3, GFR 30-59 ml/min (HCC)   Essential hypertension   Diabetes mellitus without complication (HCC)   Chronic respiratory failure with hypoxia (HCC) COPD Cor pulmonale Chronic diastolic CHF     Past Surgical History:    Procedure Laterality Date  . COLON SURGERY    . ROTATOR CUFF REPAIR       reports that he has quit smoking. His smoking use included cigarettes. He has never used smokeless tobacco. He reports that he does not drink alcohol or use drugs.  No Known Allergies  Family History  Problem Relation Age of Onset  . Hypertension Father    2 brothers with heart attack   Prior to Admission medications   Medication Sig Start Date End Date Taking? Authorizing Provider  albuterol (PROVENTIL HFA;VENTOLIN HFA) 108 (90 Base) MCG/ACT inhaler Inhale 2 puffs into the lungs every 6 (six) hours as needed for wheezing or shortness of breath. 03/08/18  Yes Tat, Onalee Hua, MD  apixaban (ELIQUIS) 5 MG TABS tablet Take 1 tablet (5 mg total) by mouth 2 (two) times daily. 03/08/18  Yes Tat, Onalee Hua, MD  atorvastatin (LIPITOR) 40 MG tablet Take 1 tablet by mouth every evening.    Yes [provider]  carvedilol (COREG) 25 MG tablet Take 25 mg by mouth 2 (two) times daily.  01/19/14  Yes [provider]  Cyanocobalamin (VITAMIN B-12 PO) Take 1 tablet by mouth daily.   Yes [provider]  diltiazem (CARDIZEM CD) 120 MG 24 hr capsule Take 1 capsule (120 mg total) by mouth daily. 03/08/18  Yes Tat, Onalee Hua, MD  DULoxetine (CYMBALTA) 60 MG capsule Take 60 mg by mouth daily.   Yes [provider]  fluticasone (FLONASE) 50 MCG/ACT nasal spray Place  1-2 sprays into both nostrils daily as needed for allergies or rhinitis.   Yes [provider]  furosemide (LASIX) 40 MG tablet Take 1 tablet (40 mg total) by mouth daily. 03/09/18  Yes Tat, Onalee Hua, MD  glimepiride (AMARYL) 2 MG tablet Take 1 tablet by mouth daily. 03/05/14  Yes [provider]  HYDROcodone-acetaminophen (NORCO) 7.5-325 MG tablet Take 1 tablet by mouth every 6 (six) hours as needed for moderate pain.   Yes [provider]  insulin aspart (NOVOLOG) 100 UNIT/ML injection Inject 5 Units into the skin 3 (three) times  daily before meals.    Yes [provider]  ipratropium-albuterol (DUONEB) 0.5-2.5 (3) MG/3ML SOLN Inhale 3 mLs into the lungs 3 (three) times daily as needed (for shortness of breath/wheezing).  02/15/18  Yes [provider]  LEVEMIR FLEXTOUCH 100 UNIT/ML Pen Inject 35 Units into the skin at bedtime.  02/20/18  Yes [provider]  Multiple Vitamin (MULTIVITAMIN WITH MINERALS) TABS tablet Take 1 tablet by mouth daily.   Yes [provider]  omeprazole (PRILOSEC) 40 MG capsule Take 1 capsule by mouth daily.  01/19/14  Yes [provider]  vitamin C (ASCORBIC ACID) 500 MG tablet Take 500 mg by mouth daily.   Yes [provider]  predniSONE (DELTASONE) 20 MG tablet Take 2 tablets (40 mg total) by mouth daily with breakfast. X 3 days. Then 1 tablet (20 mg) daily x 3 days Patient not taking: Reported on 03/20/2018 03/09/18   Catarina Hartshorn, MD  sildenafil (REVATIO) 20 MG tablet Take 1 tablet (20 mg total) by mouth 3 (three) times daily. 03/08/18   Catarina Hartshorn, MD    Physical Exam: Vitals:   03/05/2018 2154 03/15/2018 2230 03/12/2018 2245 03/09/2018 2300  BP:  122/68 105/71 (!) 121/105  Pulse:  (!) 126 (!) 124 (!) 141  Resp:  (!) 22 (!) 24 (!) 23  Temp: 97.9 F (36.6 C)     TempSrc: Oral     SpO2:  91% 92% (!) 89%  Weight: 89.3 kg     Height: 5\' 6"  (1.676 m)       Constitutional: NAD, calm, comfortable, sitting side of bed  Vitals:   03/20/2018 2154 03/17/2018 2230 03/17/2018 2245 03/12/2018 2300  BP:  122/68 105/71 (!) 121/105  Pulse:  (!) 126 (!) 124 (!) 141  Resp:  (!) 22 (!) 24 (!) 23  Temp: 97.9 F (36.6 C)     TempSrc: Oral     SpO2:  91% 92% (!) 89%  Weight: 89.3 kg     Height: 5\' 6"  (1.676 m)      Eyes: PERRL, lids and conjunctivae normal ENMT: Mucous membranes are moist. Posterior pharynx clear of any exudate or lesions..  Neck: normal, supple, no masses,  Respiratory: clear to auscultation bilaterally, no wheezing, no crackles. Normal  respiratory effort. No accessory muscle use.  Cardiovascular: tachy rate and Irreg irreg  rhythm, no murmurs / rubs / gallops.trace extremity edema. 1+ pedal pulses.  Abdomen: no tenderness, no masses palpated. No hepatosplenomegaly. Bowel sounds positive.  Musculoskeletal: no clubbing / cyanosis. No joint deformity upper and lower extremities. Good ROM, no contractures. Normal muscle tone.  Skin: no rashes, lesions, ulcers. No induration Neurologic: CN 2-12 grossly intact. . Strength 5/5 in all 4.  Psychiatric: Normal judgment and insight. Alert and oriented x 3. Normal mood.    Labs on Admission: I have personally reviewed following labs and imaging studies  CBC: Recent Labs  Lab  April 07, 2018 1758  WBC 13.7*  NEUTROABS 10.0*  HGB 11.9*  HCT 38.7*  MCV 91.3  PLT 403*   Basic Metabolic Panel: Recent Labs  Lab 04/07/2018 1758  NA 132*  K 3.6  CL 92*  CO2 28  GLUCOSE 322*  BUN 25*  CREATININE 1.43*  CALCIUM 8.9   GFR: Estimated Creatinine Clearance: 44.6 mL/min (A) (by C-G formula based on SCr of 1.43 mg/dL (H)). Liver Function Tests: Recent Labs  Lab 04/07/2018 1758  AST 20  ALT 15  ALKPHOS 71  BILITOT 1.0  PROT 7.8  ALBUMIN 3.6   No results for input(s): LIPASE, AMYLASE in the last 168 hours. No results for input(s): AMMONIA in the last 168 hours. Coagulation Profile: No results for input(s): INR, PROTIME in the last 168 hours. Cardiac Enzymes: Recent Labs  Lab 04/07/2018 1758  TROPONINI 0.44*   BNP (last 3 results) No results for input(s): PROBNP in the last 8760 hours. HbA1C: No results for input(s): HGBA1C in the last 72 hours. CBG: Recent Labs  Lab Apr 07, 2018 2244  GLUCAP 264*   Lipid Profile: No results for input(s): CHOL, HDL, LDLCALC, TRIG, CHOLHDL, LDLDIRECT in the last 72 hours. Thyroid Function Tests: No results for input(s): TSH, T4TOTAL, FREET4, T3FREE, THYROIDAB in the last 72 hours. Anemia Panel: No results for input(s): VITAMINB12, FOLATE,  FERRITIN, TIBC, IRON, RETICCTPCT in the last 72 hours. Urine analysis:    Component Value Date/Time   COLORURINE STRAW (A) 02/06/2018 1415   APPEARANCEUR CLEAR 02/06/2018 1415   LABSPEC 1.006 02/06/2018 1415   PHURINE 7.0 02/06/2018 1415   GLUCOSEU NEGATIVE 02/06/2018 1415   HGBUR NEGATIVE 02/06/2018 1415   BILIRUBINUR NEGATIVE 02/06/2018 1415   KETONESUR NEGATIVE 02/06/2018 1415   PROTEINUR NEGATIVE 02/06/2018 1415   NITRITE NEGATIVE 02/06/2018 1415   LEUKOCYTESUR NEGATIVE 02/06/2018 1415    Radiological Exams on Admission: Dg Chest Port 1 View  Result Date: 04-07-2018 CLINICAL DATA:  Shortness of breath for 2 weeks, tachycardia for 1 week. History of COPD and CHF. EXAM: PORTABLE CHEST 1 VIEW COMPARISON:  Chest radiograph March 04, 2018 FINDINGS: Stable at least moderate cardiomegaly, partially obscured by diffuse interstitial prominence, alveolar airspace opacities. Small pleural effusions. Mediastinal silhouette is not suspicious, calcified aortic arch. Fullness of the pulmonary hila unchanged. No pneumothorax. Suture anchors bilateral humeral heads. IMPRESSION: Stable cardiomegaly. Diffuse interstitial prominence, potentially chronic. Similar bibasilar airspace opacities and small pleural effusions. Aortic Atherosclerosis (ICD10-I70.0). Electronically Signed   By: Awilda Metro M.D.   On: 2018/04/07 18:28    EKG: Independently reviewed. SVT rate 141 abnl ekg  Assessment/Plan Principal Problem:   Atrial fibrillation with RVR (HCC) Active Problems:   CKD (chronic kidney disease) stage 3, GFR 30-59 ml/min (HCC)   Essential hypertension   Diabetes mellitus without complication (HCC)   Chronic respiratory failure with hypoxia (HCC)  chronic diastolic CHF Elevated troponin Cor [place]ale COPD  -Cont Cardisem, gtt, cont Eliquis. Cont home Coreg. Cycle cardiac enzymes. Await further recommendations  from cardiology most recent echo shows EF 55 to 60% with positive diastolic  dysfunction indeterminate value with right heart overload -Chronic kidney disease at baseline -Carb consistent diet, sliding scale insulin , hemoglobin A1c 6.6 1 mo ago . continue home insulin regimen -cont home oxygen supplementation and nebs -Continue home Lasix, follow  I's and O's, daily weights    DVT prophylaxis: cont Eliquis  Code Status: DNI  Family Communication: discussed with son Rusty at bedside Disposition Plan: home 2 days   Consults  called: Ed called Cardiology Dr Anne Fu  Admission status: Obs SDU    Synetta Fail MD [facility] Pager [phone]  If 7PM-7AM, please contact night-coverage [email or web address] Password [email or web address]  03/17/2018, 12:00 AM

## 2018-03-17 NOTE — Care Management Obs Status (Signed)
MEDICARE OBSERVATION STATUS NOTIFICATION   Patient Details  Name: Mario Walker MRN: [id number] Date of Birth: 1940-06-29   Medicare Observation Status Notification Given:  Yes    Alexa Golebiewski, Chrystine Oiler, RN 03/17/2018, 7:45 AM

## 2018-03-17 NOTE — Care Management (Signed)
For home use only DME oxygen (Order [id number])  General Supply  Date: 03/17/2018 Department: [facility] Ordering/Authorizing: Briant Cedar, MD   Briant Cedar, MD NPI: [id number]    Patient Information   Patient Name Mario Walker, Mario Walker Sex Male DOB Jun 01, 1941 SSN [id number]  Order Information   Order Date/Time Release Date/Time Start Date/Time End Date/Time  03/17/18 12:22 PM None 03/17/18 12:23 PM 03/17/18 12:23 PM  Order History  Inpatient  Date/Time Action Taken User Additional Information  03/17/18 1222 Sign Briant Cedar, MD   03/17/18 1222 Release Instance Briant Cedar, MD (auto-released) Released Order: [id number]  Order Questions   Question Answer Comment  Liters per Minute 6   Frequency Continuous (stationary and portable oxygen unit needed)   Oxygen delivery system Gas       Process Instructions   Verify pulse oximetry with O2 sat less than or equal to 88% on RA or Arterial Blood Gas (ABG) with pO2 or below.   If pulse oximetry is performed during ambulation or exertion, the following must be documented:  . O2 sat on RA at rest  . O2 sat on RA while ambulating/during exertion  . O2 sat on oxygen while ambulating/during exertion   Standing Order Information   Remaining Occurrences Interval Last Released     0/1 Once 03/17/2018         Collection Information    Encounter   View Encounter        Tracking Reports

## 2018-03-18 ENCOUNTER — Encounter (HOSPITAL_COMMUNITY): Payer: Self-pay | Admitting: Physician Assistant

## 2018-03-18 ENCOUNTER — Observation Stay (HOSPITAL_COMMUNITY): Payer: Medicare HMO

## 2018-03-18 DIAGNOSIS — J439 Emphysema, unspecified: Secondary | ICD-10-CM

## 2018-03-18 DIAGNOSIS — I10 Essential (primary) hypertension: Secondary | ICD-10-CM

## 2018-03-18 DIAGNOSIS — D649 Anemia, unspecified: Secondary | ICD-10-CM

## 2018-03-18 DIAGNOSIS — G931 Anoxic brain damage, not elsewhere classified: Secondary | ICD-10-CM | POA: Diagnosis not present

## 2018-03-18 DIAGNOSIS — N183 Chronic kidney disease, stage 3 (moderate): Secondary | ICD-10-CM | POA: Diagnosis not present

## 2018-03-18 DIAGNOSIS — I130 Hypertensive heart and chronic kidney disease with heart failure and stage 1 through stage 4 chronic kidney disease, or unspecified chronic kidney disease: Secondary | ICD-10-CM | POA: Diagnosis present

## 2018-03-18 DIAGNOSIS — Z8249 Family history of ischemic heart disease and other diseases of the circulatory system: Secondary | ICD-10-CM | POA: Diagnosis not present

## 2018-03-18 DIAGNOSIS — J9622 Acute and chronic respiratory failure with hypercapnia: Secondary | ICD-10-CM | POA: Diagnosis present

## 2018-03-18 DIAGNOSIS — R748 Abnormal levels of other serum enzymes: Secondary | ICD-10-CM

## 2018-03-18 DIAGNOSIS — I272 Pulmonary hypertension, unspecified: Secondary | ICD-10-CM | POA: Diagnosis present

## 2018-03-18 DIAGNOSIS — Z9981 Dependence on supplemental oxygen: Secondary | ICD-10-CM | POA: Diagnosis not present

## 2018-03-18 DIAGNOSIS — I4891 Unspecified atrial fibrillation: Secondary | ICD-10-CM

## 2018-03-18 DIAGNOSIS — N179 Acute kidney failure, unspecified: Secondary | ICD-10-CM | POA: Diagnosis present

## 2018-03-18 DIAGNOSIS — Z87891 Personal history of nicotine dependence: Secondary | ICD-10-CM | POA: Diagnosis not present

## 2018-03-18 DIAGNOSIS — E785 Hyperlipidemia, unspecified: Secondary | ICD-10-CM | POA: Diagnosis present

## 2018-03-18 DIAGNOSIS — I4892 Unspecified atrial flutter: Secondary | ICD-10-CM | POA: Diagnosis present

## 2018-03-18 DIAGNOSIS — E871 Hypo-osmolality and hyponatremia: Secondary | ICD-10-CM | POA: Diagnosis present

## 2018-03-18 DIAGNOSIS — J449 Chronic obstructive pulmonary disease, unspecified: Secondary | ICD-10-CM | POA: Diagnosis present

## 2018-03-18 DIAGNOSIS — I5032 Chronic diastolic (congestive) heart failure: Secondary | ICD-10-CM | POA: Diagnosis not present

## 2018-03-18 DIAGNOSIS — E119 Type 2 diabetes mellitus without complications: Secondary | ICD-10-CM | POA: Diagnosis not present

## 2018-03-18 DIAGNOSIS — R570 Cardiogenic shock: Secondary | ICD-10-CM | POA: Diagnosis not present

## 2018-03-18 DIAGNOSIS — I2781 Cor pulmonale (chronic): Secondary | ICD-10-CM

## 2018-03-18 DIAGNOSIS — I480 Paroxysmal atrial fibrillation: Secondary | ICD-10-CM | POA: Diagnosis present

## 2018-03-18 DIAGNOSIS — J9621 Acute and chronic respiratory failure with hypoxia: Secondary | ICD-10-CM | POA: Diagnosis present

## 2018-03-18 DIAGNOSIS — I5033 Acute on chronic diastolic (congestive) heart failure: Secondary | ICD-10-CM | POA: Diagnosis present

## 2018-03-18 DIAGNOSIS — J9611 Chronic respiratory failure with hypoxia: Secondary | ICD-10-CM | POA: Diagnosis not present

## 2018-03-18 DIAGNOSIS — I469 Cardiac arrest, cause unspecified: Secondary | ICD-10-CM | POA: Diagnosis not present

## 2018-03-18 DIAGNOSIS — Z79899 Other long term (current) drug therapy: Secondary | ICD-10-CM | POA: Diagnosis not present

## 2018-03-18 DIAGNOSIS — Z7189 Other specified counseling: Secondary | ICD-10-CM | POA: Diagnosis not present

## 2018-03-18 DIAGNOSIS — K219 Gastro-esophageal reflux disease without esophagitis: Secondary | ICD-10-CM | POA: Diagnosis present

## 2018-03-18 DIAGNOSIS — I2721 Secondary pulmonary arterial hypertension: Secondary | ICD-10-CM

## 2018-03-18 DIAGNOSIS — Z7951 Long term (current) use of inhaled steroids: Secondary | ICD-10-CM | POA: Diagnosis not present

## 2018-03-18 DIAGNOSIS — I248 Other forms of acute ischemic heart disease: Secondary | ICD-10-CM | POA: Diagnosis present

## 2018-03-18 DIAGNOSIS — Z794 Long term (current) use of insulin: Secondary | ICD-10-CM | POA: Diagnosis not present

## 2018-03-18 DIAGNOSIS — E1122 Type 2 diabetes mellitus with diabetic chronic kidney disease: Secondary | ICD-10-CM | POA: Diagnosis present

## 2018-03-18 DIAGNOSIS — Z515 Encounter for palliative care: Secondary | ICD-10-CM | POA: Diagnosis not present

## 2018-03-18 DIAGNOSIS — Z7901 Long term (current) use of anticoagulants: Secondary | ICD-10-CM | POA: Diagnosis not present

## 2018-03-18 DIAGNOSIS — D72829 Elevated white blood cell count, unspecified: Secondary | ICD-10-CM | POA: Diagnosis present

## 2018-03-18 DIAGNOSIS — R079 Chest pain, unspecified: Secondary | ICD-10-CM

## 2018-03-18 LAB — URINALYSIS, ROUTINE W REFLEX MICROSCOPIC
BILIRUBIN URINE: NEGATIVE
Bacteria, UA: NONE SEEN
GLUCOSE, UA: 50 mg/dL — AB
HGB URINE DIPSTICK: NEGATIVE
Ketones, ur: NEGATIVE mg/dL
NITRITE: NEGATIVE
PROTEIN: NEGATIVE mg/dL
Specific Gravity, Urine: 1.016 (ref 1.005–1.030)
pH: 5 (ref 5.0–8.0)

## 2018-03-18 LAB — CBC WITH DIFFERENTIAL/PLATELET
BASOS ABS: 0 10*3/uL (ref 0.0–0.1)
Basophils Relative: 0 %
EOS PCT: 2 %
Eosinophils Absolute: 0.2 10*3/uL (ref 0.0–0.7)
HEMATOCRIT: 33.7 % — AB (ref 39.0–52.0)
HEMOGLOBIN: 10.3 g/dL — AB (ref 13.0–17.0)
LYMPHS ABS: 2 10*3/uL (ref 0.7–4.0)
LYMPHS PCT: 19 %
MCH: 27.5 pg (ref 26.0–34.0)
MCHC: 30.6 g/dL (ref 30.0–36.0)
MCV: 89.9 fL (ref 78.0–100.0)
Monocytes Absolute: 1 10*3/uL (ref 0.1–1.0)
Monocytes Relative: 9 %
NEUTROS ABS: 7.2 10*3/uL (ref 1.7–7.7)
NEUTROS PCT: 70 %
PLATELETS: 283 10*3/uL (ref 150–400)
RBC: 3.75 MIL/uL — AB (ref 4.22–5.81)
RDW: 15.3 % (ref 11.5–15.5)
WBC: 10.4 10*3/uL (ref 4.0–10.5)

## 2018-03-18 LAB — GLUCOSE, CAPILLARY
GLUCOSE-CAPILLARY: 220 mg/dL — AB (ref 70–99)
GLUCOSE-CAPILLARY: 193 mg/dL — AB (ref 70–99)
Glucose-Capillary: 133 mg/dL — ABNORMAL HIGH (ref 70–99)
Glucose-Capillary: 285 mg/dL — ABNORMAL HIGH (ref 70–99)

## 2018-03-18 LAB — BASIC METABOLIC PANEL
ANION GAP: 10 (ref 5–15)
BUN: 44 mg/dL — ABNORMAL HIGH (ref 8–23)
CHLORIDE: 94 mmol/L — AB (ref 98–111)
CO2: 29 mmol/L (ref 22–32)
Calcium: 8.6 mg/dL — ABNORMAL LOW (ref 8.9–10.3)
Creatinine, Ser: 2.29 mg/dL — ABNORMAL HIGH (ref 0.61–1.24)
GFR calc Af Amer: 30 mL/min — ABNORMAL LOW (ref >60)
GFR, EST NON AFRICAN AMERICAN: 26 mL/min — AB (ref >60)
Glucose, Bld: 126 mg/dL — ABNORMAL HIGH (ref 70–99)
POTASSIUM: 4.6 mmol/L (ref 3.5–5.1)
Sodium: 133 mmol/L — ABNORMAL LOW (ref 135–145)

## 2018-03-18 LAB — CREATININE, URINE, RANDOM: CREATININE, URINE: 153.07 mg/dL

## 2018-03-18 LAB — SODIUM, URINE, RANDOM: Sodium, Ur: <10 mmol/L

## 2018-03-18 MED ORDER — FUROSEMIDE 10 MG/ML IJ SOLN
20.0000 mg | Freq: Two times a day (BID) | INTRAMUSCULAR | Status: DC
  Administered 2018-03-18 – 2018-03-20 (×6): 20 mg via INTRAVENOUS
  Filled 2018-03-18 (×6): qty 2

## 2018-03-18 MED ORDER — SODIUM CHLORIDE 0.9 % IV SOLN
INTRAVENOUS | Status: DC
  Administered 2018-03-18: 10:00:00 via INTRAVENOUS

## 2018-03-18 MED ORDER — CARVEDILOL 12.5 MG PO TABS
25.0000 mg | ORAL_TABLET | Freq: Two times a day (BID) | ORAL | Status: DC
  Administered 2018-03-18 – 2018-03-24 (×14): 25 mg via ORAL
  Filled 2018-03-18 (×14): qty 2

## 2018-03-18 NOTE — Consult Note (Addendum)
Cardiology Consultation:   Patient ID: Mario Walker; [id number]; 06/28/1940   Admit date: 2018-04-10 Date of Consult: 03/18/2018  Primary Care Provider: Craig Staggers, MD Primary Cardiologist: Nona Dell, MD  Chief Complaint: SOB, palpitations  Patient Profile:   Mario Walker is a 77 y.o. 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 Janee Morn.  History of Present Illness:   Apparently Mario Walker was healthy up until a few months ago. He was started on Lasix a few months ago and has been on O2 as well for hypoxia. He had been admitted in [place] around 12/2017 for CHF and was discharged with Lasix 40mg  daily. In 01/2018 he was admitted with hypoxia despite usual 4L of O2. Workup included abnormal CT of the chest showing "Chronic interstitial lung changes with mosaic attenuation of the lung parenchyma. Differential diagnosis includes obstructive small airway disease, air trapping, or occlusive vascular disease. Emphysema. Mediastinal lymphadenopathy, likely reactive. Enlarged heart with mild interstitial pulmonary edema." He was referred to Dr. Tyrone Sage to consider mediastinal lymph node bx who felt this was likely reactive to underlying pulm or cardiac process, and it was suggested he see cardiology. ANCA and ANA were normal. 2D echo 02/07/18 showed mild LVH, EF 55-60%, evidence of right heart failure with systolic flattening of LV septum, mild MR, mild-moderately dilated RV, PASP . He was also diuresed with IV Lasix during admission with -4kg diuresis. He was readmitted 9/13 with recurrent worsening respiratory failure with hypoxia/hypercarbia and recurrent CHF  with flat elevated troponin and mild pulm bronchospasm. He was also found to have atrial fib and was started on Eliquis. Dr. Diona Browner felt he was unlikely to maintain NSR so recommended rate control strategy with cardizem. Dr. Juanetta Gosling consulted on the patient during admission and recommended initiation of sildenafil and prednisone. He was seen back in Dr. Adah Perl office 9/25 for follow-up reporting worsening palpitations and dyspnea and was noted to have a rapid rate. He was sent to [facility] ER where he was started on diltiazem after which he dropped his pressure to 74/49. Saline bolus was given. His creatinine then was noted to bump to a BUN of 44/Cr 2.29 (1.43 on admission). Labs also reveal BNP 583, troponin 0.44-0.61->0.63, mild leukocytosis and thrombocytosis, mild anemia Hgb down to 10.3. Thyroid normal. CXR suggests congestive heart failure with prominent bilateral pulmonary interstitial edema and bilateral pleural effusions. He is back on home doses of diltiazem and carvedilol and HR is now well controlled. He did note development of LEE this AM (? R/t fluids given for hypotension) but palpitations have resolved and his breathing feels back to baseline. Blood pressure is now normal. He remains on higher O2 requirement than baseline. Dc weight 9/17 appears to have been 197lb, now 202 lb. Telemetry appears to have shown both afib and flutter, now rate controlled fib.  PFTs 9/25 showed severe ventilatory defect without definite airflow obstruction or bronchodilator improvement, DLCO severely reduced - unable to complete "pleth" portion of test.    Past Medical History:  Diagnosis Date  . Atrial fibrillation with RVR (HCC)   . CHF (congestive heart failure), NYHA class I, chronic, diastolic (HCC)   . Chronic kidney disease   . Chronic respiratory failure  with hypoxia and hypercapnia (HCC)   . COPD (chronic obstructive pulmonary disease) (HCC)   . Cor pulmonale (chronic) (HCC)   . Essential hypertension    . GERD (gastroesophageal reflux disease)   . Hyperlipidemia   . Type 2 diabetes mellitus (HCC)     Past Surgical History:  Procedure Laterality Date  . COLON SURGERY    . ROTATOR CUFF REPAIR       Inpatient Medications: Scheduled Meds: . apixaban  5 mg Oral BID  . atorvastatin  40 mg Oral QPM  . carvedilol  25 mg Oral BID  . diltiazem  120 mg Oral Daily  . DULoxetine  60 mg Oral Daily  . furosemide  20 mg Intravenous Q12H  . insulin aspart  0-15 Units Subcutaneous TID WC  . insulin aspart  0-5 Units Subcutaneous QHS  . insulin aspart  5 Units Subcutaneous TID AC  . insulin glargine  35 Units Subcutaneous QHS  . levalbuterol  0.63 mg Nebulization Q6H  . multivitamin with minerals  1 tablet Oral Daily  . pantoprazole  80 mg Oral Daily  . vitamin C  500 mg Oral Daily   Continuous Infusions:  PRN Meds: acetaminophen, albuterol, fluticasone, HYDROcodone-acetaminophen, ondansetron (ZOFRAN) IV  Home Meds: Prior to Admission medications   Medication Sig Start Date End Date Taking? Authorizing Provider  albuterol (PROVENTIL HFA;VENTOLIN HFA) 108 (90 Base) MCG/ACT inhaler Inhale 2 puffs into the lungs every 6 (six) hours as needed for wheezing or shortness of breath. 03/08/18  Yes Tat, Onalee Hua, MD  apixaban (ELIQUIS) 5 MG TABS tablet Take 1 tablet (5 mg total) by mouth 2 (two) times daily. 03/08/18  Yes Tat, Onalee Hua, MD  atorvastatin (LIPITOR) 40 MG tablet Take 1 tablet by mouth every evening.    Yes [provider]  carvedilol (COREG) 25 MG tablet Take 25 mg by mouth 2 (two) times daily.  01/19/14  Yes [provider]  Cyanocobalamin (VITAMIN B-12 PO) Take 1 tablet by mouth daily.   Yes [provider]  diltiazem (CARDIZEM CD) 120 MG 24 hr capsule Take 1 capsule (120 mg total) by mouth daily. 03/08/18  Yes Tat, Onalee Hua, MD  DULoxetine (CYMBALTA) 60 MG capsule Take 60 mg by mouth daily.   Yes [provider]  fluticasone (FLONASE) 50 MCG/ACT nasal spray  Place 1-2 sprays into both nostrils daily as needed for allergies or rhinitis.   Yes [provider]  furosemide (LASIX) 40 MG tablet Take 1 tablet (40 mg total) by mouth daily. 03/09/18  Yes Tat, Onalee Hua, MD  glimepiride (AMARYL) 2 MG tablet Take 1 tablet by mouth daily. 03/05/14  Yes [provider]  HYDROcodone-acetaminophen (NORCO) 7.5-325 MG tablet Take 1 tablet by mouth every 6 (six) hours as needed for moderate pain.   Yes [provider]  insulin aspart (NOVOLOG) 100 UNIT/ML injection Inject 5 Units into the skin 3 (three) times daily before meals.    Yes [provider]  ipratropium-albuterol (DUONEB) 0.5-2.5 (3) MG/3ML SOLN Inhale 3 mLs into the lungs 3 (three) times daily as needed (for shortness of breath/wheezing).  02/15/18  Yes [provider]  LEVEMIR FLEXTOUCH 100 UNIT/ML Pen Inject 35 Units into the skin at bedtime.  02/20/18  Yes [provider]  Multiple Vitamin (MULTIVITAMIN WITH MINERALS) TABS tablet Take 1 tablet by mouth daily.   Yes [provider]  omeprazole (PRILOSEC) 40 MG capsule Take 1 capsule by mouth daily.  01/19/14  Yes [provider]  vitamin  C (ASCORBIC ACID) 500 MG tablet Take 500 mg by mouth daily.   Yes [provider]  predniSONE (DELTASONE) 20 MG tablet Take 2 tablets (40 mg total) by mouth daily with breakfast. X 3 days. Then 1 tablet (20 mg) daily x 3 days Patient not taking: Reported on 02/27/2018 03/09/18   Catarina Hartshorn, MD  sildenafil (REVATIO) 20 MG tablet Take 1 tablet (20 mg total) by mouth 3 (three) times daily. 03/08/18   Catarina Hartshorn, MD    Allergies:   No Known Allergies  Social History:   Social History   Socioeconomic History  . Marital status: Married    Spouse name: Not on file  . Number of children: Not on file  . Years of education: Not on file  . Highest education level: Not on file  Occupational History  . Not on file  Social Needs  . Financial resource strain:  Not on file  . Food insecurity:    Worry: Not on file    Inability: Not on file  . Transportation needs:    Medical: Not on file    Non-medical: Not on file  Tobacco Use  . Smoking status: Former Smoker    Types: Cigarettes  . Smokeless tobacco: Never Used  Substance and Sexual Activity  . Alcohol use: No  . Drug use: No  . Sexual activity: Not on file  Lifestyle  . Physical activity:    Days per week: Not on file    Minutes per session: Not on file  . Stress: Not on file  Relationships  . Social connections:    Talks on phone: Not on file    Gets together: Not on file    Attends religious service: Not on file    Active member of club or organization: Not on file    Attends meetings of clubs or organizations: Not on file    Relationship status: Not on file  . Intimate partner violence:    Fear of current or ex partner: Not on file    Emotionally abused: Not on file    Physically abused: Not on file    Forced sexual activity: Not on file  Other Topics Concern  . Not on file  Social History Narrative  . Not on file    Family History:   The patient's family history includes Heart attack in his brother; Hypertension in his father.  ROS:  Please see the history of present illness.  All other ROS reviewed and negative.     Physical Exam/Data:   Vitals:   03/18/18 0500 03/18/18 0600 03/18/18 0800 03/18/18 0924  BP:  125/75    Pulse:  (!) 101    Resp:  18    Temp:   98.6 F (37 C)   TempSrc:   Oral   SpO2:  92% 93%   Weight: 91.7 kg   91.7 kg  Height:        Intake/Output Summary (Last 24 hours) at 03/18/2018 1154 Last data filed at 03/18/2018 0500 Gross per 24 hour  Intake -  Output 375 ml  Net -375 ml   Filed Weights   03/17/18 0500 03/18/18 0500 03/18/18 0924  Weight: 89.3 kg 91.7 kg 91.7 kg   Body mass index is 32.63 kg/m.  General: Well developed, well nourished WM in no acute distress. Head: Normocephalic, atraumatic, sclera non-icteric, no  xanthomas, nares are without discharge.  On nasal cannula O2 Neck: Negative for carotid bruits. JVD not elevated. Lungs:  Diminished air movement throughout, coarse BS throughout without obvious wheezing or rhonchi. Breathing is unlabored. Heart: Irregularly irregular, rate controlled, S1 S2. No murmurs, rubs, or gallops appreciated. Abdomen: Soft, non-tender, non-distended with normoactive bowel sounds. No hepatomegaly. No rebound/guarding. No obvious abdominal masses. Msk:  Strength and tone appear normal for age. Extremities: No clubbing or cyanosis. Trace-1+ BLE edema with varicose veins.  Distal pedal pulses are 2+ and equal bilaterally. Neuro: Alert and oriented X 3. No facial asymmetry. No focal deficit. Moves all extremities spontaneously. Psych:  Responds to questions appropriately with a normal affect.  EKG:  The EKG was personally reviewed and demonstrates atrial fib occasional PVCs 100bpm RBBB with nonspecific ST-T changes   Laboratory Data:  Chemistry Recent Labs  Lab 02/24/2018 1758 03/17/18 1450 03/18/18 0405  NA 132* 131* 133*  K 3.6 4.3 4.6  CL 92* 94* 94*  CO2 28 27 29   GLUCOSE 322* 132* 126*  BUN 25* 36* 44*  CREATININE 1.43* 2.28* 2.29*  CALCIUM 8.9 8.5* 8.6*  GFRNONAA 45* 26* 26*  GFRAA 53* 30* 30*  ANIONGAP 12 10 10     Recent Labs  Lab 03/15/2018 1758  PROT 7.8  ALBUMIN 3.6  AST 20  ALT 15  ALKPHOS 71  BILITOT 1.0   Hematology Recent Labs  Lab 02/24/2018 1758 03/17/18 1450 03/18/18 0405  WBC 13.7* 11.2* 10.4  RBC 4.24 3.88* 3.75*  HGB 11.9* 10.7* 10.3*  HCT 38.7* 35.1* 33.7*  MCV 91.3 90.5 89.9  MCH 28.1 27.6 27.5  MCHC 30.7 30.5 30.6  RDW 15.7* 15.4 15.3  PLT 403* 328 283   Cardiac Enzymes Recent Labs  Lab 02/25/2018 1758 03/17/18 0005 03/17/18 0310  TROPONINI 0.44* 0.60* 0.63*   No results for input(s): TROPIPOC in the last 168 hours.  BNP Recent Labs  Lab 03/03/2018 1745  BNP 583.0*    DDimer No results for input(s): DDIMER in the  last 168 hours.  Radiology/Studies:  Dg Chest Port 1 View  Result Date: 03/18/2018 CLINICAL DATA:  Shortness of breath. EXAM: PORTABLE CHEST 1 VIEW COMPARISON:  03/15/2018. FINDINGS: Cardiomegaly with diffuse severe bilateral pulmonary interstitial prominence and bilateral pleural effusions. Findings consistent with CHF. No pneumothorax. IMPRESSION: Congestive heart failure with prominent bilateral pulmonary interstitial edema and bilateral pleural effusions. Electronically Signed   By: Maisie Fus  Register   On: 03/18/2018 10:34   Dg Chest Port 1 View  Result Date: 03/03/2018 CLINICAL DATA:  Shortness of breath for 2 weeks, tachycardia for 1 week. History of COPD and CHF. EXAM: PORTABLE CHEST 1 VIEW COMPARISON:  Chest radiograph March 04, 2018 FINDINGS: Stable at least moderate cardiomegaly, partially obscured by diffuse interstitial prominence, alveolar airspace opacities. Small pleural effusions. Mediastinal silhouette is not suspicious, calcified aortic arch. Fullness of the pulmonary hila unchanged. No pneumothorax. Suture anchors bilateral humeral heads. IMPRESSION: Stable cardiomegaly. Diffuse interstitial prominence, potentially chronic. Similar bibasilar airspace opacities and small pleural effusions. Aortic Atherosclerosis (ICD10-I70.0). Electronically Signed   By: Awilda Metro M.D.   On: 02/28/2018 18:28    Assessment and Plan:   1. Atrial fib/flutter with RVR 2. Recurrent acute on chronic respiratory failure in the setting of possible interstitial lung disease, in early stages of being worked up 3. Significant right heart failure with pulmonary HTN by echo 01/2018 4. AKI on CKD following administration of IV diltiazem which caused hypotension 5. Anemia 6. Elevated troponin  Very interesting situation with several different spheres of problems - pulmonary disease, elevated troponin, worsening atrial arrhythmias, right heart  failure and pulmonary HTN. It would appear that his right  heart failure is likely driven by pulmonary disease but it does not seem like we have a great handle on exactly what this represents. I agree with prior thoughts that his atrial fib might be difficult to control for the rhythm given underlying lung disease, but left atrium is normal so wonder if attempt at rhythm control needs to be considered. It does concern me that his BUN and Cr have bumped significantly - may be related to hypotension but renal US +/- nephrology consult may be able to help to determine if there is any unifying diagnosis here. IM has started diuresis as he is volume overloaded once again. Need to be cautious with renal function and recent hypotension.  Regarding pulm HTN, has not had RHC or VQ scan but these might be considered. I will have Dr. Purvis Sheffield review for further recs on this complex patient.   For questions or updates, please contact [facility] Please consult [email or web address] for contact info under Cardiology/STEMI.    Signed, Laurann Montana, PA-C  03/18/2018 11:54 AM   The patient was seen and examined, and I agree with the history, physical exam, assessment and plan as documented above, with modifications as noted below. I have also personally reviewed all relevant documentation, old records, labs, and both radiographic and cardiovascular studies. I have also independently interpreted old and new ECG's.  Briefly, this is a 77 year old very pleasant gentleman who was initially evaluated by Dr. Diona Browner on 03/07/2018 for new onset rapid atrial fibrillation.  He was recently diagnosed with interstitial lung disease and pulmonary hypertension.  I reviewed the chest CT performed on 02/06/2018 which demonstrated chronic interstitial lung changes with mosaic attenuation of the lung parenchyma.  The differential diagnosis included obstructive small airway disease, air trapping, or occlusive vascular disease.  It also showed emphysema. There was also mediastinal lymphadenopathy  deemed to be reactive.  Aortic atherosclerosis was also seen.  He has required increasing levels of oxygen supplementation over the last few months.  His shortness of breath is gotten worse over the past few months as per the patient.  He has occasional chest discomfort primarily with exertion over the past month.  I reviewed his echocardiogram from August 2019 which showed normal left ventricular systolic function, LVEF 55 to 56%, mild LVH, with mild to moderate right ventricular dilatation, mild mitral regurgitation, moderate tricuspid regurgitation, and severe pulmonary hypertension, pulmonary pressure 65 mmHg.  There was grade indeterminate diastolic dysfunction with high ventricular filling pressures.  When he was hospitalized earlier this month, it was felt a rate control strategy should be pursued as he was unlikely to maintain sinus rhythm given his underlying pulmonary disease.  He has been anticoagulated with apixaban.  He saw his pulmonologist yesterday and due to palpitations and shortness of breath with tachycardia he was sent to the ED.  He was found to be in rapid atrial fibrillation with additional details noted above.  He is now on oral diltiazem and carvedilol with good heart rate control.  Of note, pulmonary function testing showed a severe ventilatory defect without definite airflow obstruction or bronchodilator improvement with severe DLCO reduction.  Chest x-ray today shows CHF with prominent bilateral pulmonary interstitial edema and bilateral pleural effusions.  Assessment and plan: 1.  Rapid atrial fibrillation: Heart rate currently controlled on long-acting diltiazem 120 mg daily and carvedilol 25 mg twice daily.  Anticoagulated with apixaban 5 mg twice daily.  Left atrial size is  normal.  Although he may not be able to maintain sinus rhythm given his pulmonary disease, it may be worthwhile to try an attempt at direct-current cardioversion after he has been on systemic  anticoagulation for at least 3 weeks.  2.  Acute diastolic heart failure: Currently on low-dose IV Lasix with symptomatic improvement.  3.  Chest pain with troponin elevation: He has been experiencing episodic chest pain with exertion at home for the past month.  He warrants an outpatient nuclear stress test.  I would have preferred coronary CT angiography but given his acute renal insufficiency and presence of atrial fibrillation, he would not be a candidate for this.  4.  Severe pulmonary hypertension: While this may be due to underlying chronic interstitial lung disease, this will need further work-up.  He appears to have overlap of both group 2 and group 3 pulmonary hypertension.  At the present time, I do not see any benefit to sildenafil.  He is being treated with prednisone as well.  He would benefit from right heart catheterization to help sort things out.  As occlusive vascular disease was also mentioned in the differential diagnosis on the chest CT, he should also get an outpatient VQ scan.  5.  Acute renal failure: This may be due to acute tubular necrosis from hypotension but this will need further monitoring and work-up.  Urine electrolytes, urinalysis, and culture have been ordered and are pending.  Prentice Docker, MD, [facility]  03/18/2018 1:04 PM

## 2018-03-18 NOTE — Progress Notes (Addendum)
PROGRESS NOTE    Mario Walker  WGN:[id number] DOB: 1941-03-30 DOA: 03/15/2018 PCP: Craig Staggers, MD    Brief Narrative:  Mario Walker a 77 y.o.malewith medical history significant for COPD, chronic respiratory failure on home O2 5-6L, A. fib, type 2 diabetes, presents with tachycardia, SOB, chest discomfort. He went to Dr. Juanetta Gosling office on 03/10/2018 for follow-up visit and was noted to be tachycardic and was sent over to the ED. In the ED, pt was found to be in RVR. Patient reported since his discharge, he has noted palpitations, SOB and some intermittent chest tightness. Of note, patient was admitted on September 13 for CHF and new onset A. fib. Pt was discharged on p.o. Cardizem and Eliquis of which he reports compliance. EDP consulted cardiology, will see in am. Pt admitted for further management. He he states compliance with his medications.  Patient noted to have heart rate controlled on diltiazem drip however noted the night of 02/27/2018 to have significant hypotension such that he required bolus of IV fluids and diltiazem drip discontinued.  Patient still on high requirements of O2 and heart rate ranging in the 100s to the 120s.  Patient still with shortness of breath.    Assessment & Plan:   Principal Problem:   Atrial fibrillation with RVR (HCC) Active Problems:   Acute on chronic respiratory failure with hypoxia (HCC)   Acute on chronic diastolic CHF (congestive heart failure) (HCC)   CKD (chronic kidney disease) stage 3, GFR 30-59 ml/min (HCC)   Essential hypertension   Diabetes mellitus without complication (HCC)   Elevated troponin   Chronic respiratory failure with hypoxia (HCC)   COPD (chronic obstructive pulmonary disease) (HCC)   Chronic diastolic CHF (congestive heart failure) (HCC)   Cor pulmonale (chronic) (HCC)   Atrial fibrillation with rapid ventricular response (HCC)  1 paroxysmal A. fib with RVR On presentation patient noted to have heart rates in  the 150s and was placed on a diltiazem drip.  EKG was consistent with A. fib/a flutter.  Patient noted the night of 03/01/2018 to be hypotensive and as such diltiazem drip was discontinued and patient placed back on home regimen of Cardizem.  Patient's metoprolol on hold due to hypotension.  TSH and free T4 within normal limits.  Patient had a 2D echo done 01/2018 with a EF of 55 to 60% with diastolic dysfunction with a PA peak pressure of 65 mmHg.  Heart rate in the low 100s to the 120s.  Blood pressure has improved with systolic blood pressure now in the 120s.  Continue current dose of Cardizem and resume home dose metoprolol.  Repeat chest x-ray as patient still with shortness of breath and concerns for volume overload.  2.  Acute on chronic hypoxic respiratory failure Patient noted to be hypoxic on 6 L of O2 while sleeping.  Patient also noted to be on A. fib on presentation.  On physical exam patient noted to have diffuse crackles and concerns for acute on chronic diastolic CHF exacerbation.  Repeat chest x-ray this morning.  Saline lock IV fluids.  Discontinue home dose oral torsemide.  Placed on Lasix 20 mg IV every 12 hours.  Strict I's and O's.  Daily weights.  Cardiology consultation pending.  3.  Probable acute on chronic diastolic CHF exacerbation Likely triggered by paroxysmal atrial fibrillation with RVR.  Patient with diffuse crackles noted on examination.  Patient on 6 L high flow nasal cannula.  Will discontinue patient's torsemide.  Patient with recent  2D echo done 02/07/2018 with a EF of 55 to 60%, diastolic dysfunction, mildly dilated right atrium, moderately dilated right ventricle, PA peak pressure 65 mmHg.  Will discontinue patient's torsemide.  Check a EKG.  Cardiac enzymes on admission were trending up however seems to be plateauing.  Patient denies any chest pain.  Placed on Lasix 20 mg IV every 12 hours.  Strict I's and O's.  Daily weights.  Resume home dose Coreg.  Continue Cardizem.   Follow.  4.  Acute renal failure on chronic kidney disease stage II/III Likely secondary to prerenal azotemia secondary to bout of hypotension as patient was noted to be hypotensive 2 to 3 days ago while on Cardizem drip.  Also concerns for an acute CHF exacerbation.  Check a UA with cultures and sensitivities.  Check a urine sodium.  Check a urine creatinine.  Place on IV Lasix.  Saline lock IV fluids.  Strict I's and O's.  Follow renal function.  5.  Elevated troponins Likely secondary to demand ischemia secondary to probable acute on chronic diastolic heart failure and acute on chronic hypoxic respiratory failure.  Troponin was 0.442 0.602 0.63.  EKG on presentation showed atrial flutter with RVR.  Repeat EKG.  Cardiology consultation pending.  Resume home dose Coreg.  Discontinue torsemide and placed on IV Lasix.  Follow.  6.  Hypertension Patient noted to be hypotensive about 48 to 72 hours ago.  Blood pressure improved after diltiazem discontinued.  Systolic blood pressures now in the 120s.  Continue home regimen Cardizem.  Will resume home dose Coreg and follow.  7.  Diabetes mellitus type 2 Hemoglobin A1c at 6.6.  CBG this morning was 133.  Continue current dose Lantus, meal coverage insulin, sliding scale insulin.  Follow.  8.  Severe pulmonary hypertension We will need outpatient follow-up with pulmonologist.  May benefit from outpatient VQ scan.  May need right heart cath at some point.  Cardiology consulted.     DVT prophylaxis: Eliquis Code Status: Partial Family Communication: Updated patient.  No family at bedside. Disposition Plan: Remain in stepdown unit.   Consultants:   Cardiology pending  Procedures:   Chest x-ray 02/27/2018, 03/18/2018    Antimicrobials:   None   Subjective: Patient sitting up in chair, states some chest tightness earlier on this morning, but improving. Feeling better, but not at baseline.HR ranging from 101-120s on tele monitor in  room.  Objective: Vitals:   03/18/18 0800 03/18/18 0924 03/18/18 1157 03/18/18 1354  BP: (!) 127/102     Pulse: (!) 104     Resp: 19     Temp: 98.6 F (37 C)  97.9 F (36.6 C)   TempSrc: Oral  Oral   SpO2: 93%   91%  Weight:  91.7 kg    Height:        Intake/Output Summary (Last 24 hours) at 03/18/2018 1445 Last data filed at 03/18/2018 0500 Gross per 24 hour  Intake -  Output 375 ml  Net -375 ml   Filed Weights   03/17/18 0500 03/18/18 0500 03/18/18 0924  Weight: 89.3 kg 91.7 kg 91.7 kg    Examination:  General exam: Appears calm and comfortable  Respiratory system: Diffude crackles, greater in bases. No wheezing.  Respiratory effort normal. Cardiovascular system: Irregularly irregular.  No murmurs rubs or gallops.  1+ BLE edema L > R Gastrointestinal system: Abdomen is nondistended, soft and nontender. No organomegaly or masses felt. Normal bowel sounds heard. Central nervous system: Alert and  oriented. No focal neurological deficits. Extremities: Symmetric 5 x 5 power. Skin: No rashes, lesions or ulcers Psychiatry: Judgement and insight appear normal. Mood & affect appropriate.     Data Reviewed: I have personally reviewed following labs and imaging studies  CBC: Recent Labs  Lab 03/19/2018 1758 03/17/18 1450 03/18/18 0405  WBC 13.7* 11.2* 10.4  NEUTROABS 10.0* 8.0* 7.2  HGB 11.9* 10.7* 10.3*  HCT 38.7* 35.1* 33.7*  MCV 91.3 90.5 89.9  PLT 403* 328 283   Basic Metabolic Panel: Recent Labs  Lab 03/12/2018 1758 03/17/18 1450 03/18/18 0405  NA 132* 131* 133*  K 3.6 4.3 4.6  CL 92* 94* 94*  CO2 28 27 29   GLUCOSE 322* 132* 126*  BUN 25* 36* 44*  CREATININE 1.43* 2.28* 2.29*  CALCIUM 8.9 8.5* 8.6*   GFR: Estimated Creatinine Clearance: 28.2 mL/min (A) (by C-G formula based on SCr of 2.29 mg/dL (H)). Liver Function Tests: Recent Labs  Lab 03/14/2018 1758  AST 20  ALT 15  ALKPHOS 71  BILITOT 1.0  PROT 7.8  ALBUMIN 3.6   No results for input(s):  LIPASE, AMYLASE in the last 168 hours. No results for input(s): AMMONIA in the last 168 hours. Coagulation Profile: No results for input(s): INR, PROTIME in the last 168 hours. Cardiac Enzymes: Recent Labs  Lab 02/21/2018 1758 03/17/18 0005 03/17/18 0310  TROPONINI 0.44* 0.60* 0.63*   BNP (last 3 results) No results for input(s): PROBNP in the last 8760 hours. HbA1C: No results for input(s): HGBA1C in the last 72 hours. CBG: Recent Labs  Lab 03/17/18 1143 03/17/18 1636 03/17/18 2108 03/18/18 0800 03/18/18 1152  GLUCAP 114* 132* 141* 133* 220*   Lipid Profile: No results for input(s): CHOL, HDL, LDLCALC, TRIG, CHOLHDL, LDLDIRECT in the last 72 hours. Thyroid Function Tests: Recent Labs    03/17/18 1450  TSH 1.694  FREET4 1.34   Anemia Panel: No results for input(s): VITAMINB12, FOLATE, FERRITIN, TIBC, IRON, RETICCTPCT in the last 72 hours. Sepsis Labs: No results for input(s): PROCALCITON, LATICACIDVEN in the last 168 hours.  No results found for this or any previous visit (from the past 240 hour(s)).       Radiology Studies: Dg Chest Port 1 View  Result Date: 03/18/2018 CLINICAL DATA:  Shortness of breath. EXAM: PORTABLE CHEST 1 VIEW COMPARISON:  02/24/2018. FINDINGS: Cardiomegaly with diffuse severe bilateral pulmonary interstitial prominence and bilateral pleural effusions. Findings consistent with CHF. No pneumothorax. IMPRESSION: Congestive heart failure with prominent bilateral pulmonary interstitial edema and bilateral pleural effusions. Electronically Signed   By: Maisie Fus  Register   On: 03/18/2018 10:34   Dg Chest Port 1 View  Result Date: 02/25/2018 CLINICAL DATA:  Shortness of breath for 2 weeks, tachycardia for 1 week. History of COPD and CHF. EXAM: PORTABLE CHEST 1 VIEW COMPARISON:  Chest radiograph March 04, 2018 FINDINGS: Stable at least moderate cardiomegaly, partially obscured by diffuse interstitial prominence, alveolar airspace opacities. Small  pleural effusions. Mediastinal silhouette is not suspicious, calcified aortic arch. Fullness of the pulmonary hila unchanged. No pneumothorax. Suture anchors bilateral humeral heads. IMPRESSION: Stable cardiomegaly. Diffuse interstitial prominence, potentially chronic. Similar bibasilar airspace opacities and small pleural effusions. Aortic Atherosclerosis (ICD10-I70.0). Electronically Signed   By: Awilda Metro M.D.   On: 03/05/2018 18:28        Scheduled Meds: . apixaban  5 mg Oral BID  . atorvastatin  40 mg Oral QPM  . carvedilol  25 mg Oral BID  . diltiazem  120 mg Oral  Daily  . DULoxetine  60 mg Oral Daily  . furosemide  20 mg Intravenous Q12H  . insulin aspart  0-15 Units Subcutaneous TID WC  . insulin aspart  0-5 Units Subcutaneous QHS  . insulin aspart  5 Units Subcutaneous TID AC  . insulin glargine  35 Units Subcutaneous QHS  . levalbuterol  0.63 mg Nebulization Q6H  . multivitamin with minerals  1 tablet Oral Daily  . pantoprazole  80 mg Oral Daily  . vitamin C  500 mg Oral Daily   Continuous Infusions:    LOS: 0 days    Time spent: 40 minutes    Ramiro Harvest, MD [facility] Pager [phone]  If 7PM-7AM, please contact night-coverage [email or web address] Password [facility] 03/18/2018, 2:45 PM

## 2018-03-19 LAB — URINE CULTURE

## 2018-03-19 LAB — CBC WITH DIFFERENTIAL/PLATELET
BASOS ABS: 0 10*3/uL (ref 0.0–0.1)
BASOS PCT: 0 %
EOS ABS: 0.1 10*3/uL (ref 0.0–0.7)
EOS PCT: 1 %
HCT: 33.3 % — ABNORMAL LOW (ref 39.0–52.0)
HEMOGLOBIN: 10.2 g/dL — AB (ref 13.0–17.0)
LYMPHS ABS: 1.7 10*3/uL (ref 0.7–4.0)
Lymphocytes Relative: 16 %
MCH: 27.4 pg (ref 26.0–34.0)
MCHC: 30.6 g/dL (ref 30.0–36.0)
MCV: 89.5 fL (ref 78.0–100.0)
Monocytes Absolute: 1 10*3/uL (ref 0.1–1.0)
Monocytes Relative: 9 %
NEUTROS PCT: 74 %
Neutro Abs: 7.7 10*3/uL (ref 1.7–7.7)
PLATELETS: 278 10*3/uL (ref 150–400)
RBC: 3.72 MIL/uL — ABNORMAL LOW (ref 4.22–5.81)
RDW: 15.2 % (ref 11.5–15.5)
WBC: 10.5 10*3/uL (ref 4.0–10.5)

## 2018-03-19 LAB — GLUCOSE, CAPILLARY
GLUCOSE-CAPILLARY: 125 mg/dL — AB (ref 70–99)
GLUCOSE-CAPILLARY: 213 mg/dL — AB (ref 70–99)
GLUCOSE-CAPILLARY: 153 mg/dL — AB (ref 70–99)
Glucose-Capillary: 75 mg/dL (ref 70–99)

## 2018-03-19 LAB — MAGNESIUM: Magnesium: 1.9 mg/dL (ref 1.7–2.4)

## 2018-03-19 LAB — BASIC METABOLIC PANEL
ANION GAP: 11 (ref 5–15)
BUN: 40 mg/dL — ABNORMAL HIGH (ref 8–23)
CO2: 27 mmol/L (ref 22–32)
Calcium: 8.7 mg/dL — ABNORMAL LOW (ref 8.9–10.3)
Chloride: 95 mmol/L — ABNORMAL LOW (ref 98–111)
Creatinine, Ser: 1.86 mg/dL — ABNORMAL HIGH (ref 0.61–1.24)
GFR, EST AFRICAN AMERICAN: 38 mL/min — AB (ref >60)
GFR, EST NON AFRICAN AMERICAN: 33 mL/min — AB (ref >60)
GLUCOSE: 109 mg/dL — AB (ref 70–99)
POTASSIUM: 4.1 mmol/L (ref 3.5–5.1)
Sodium: 133 mmol/L — ABNORMAL LOW (ref 135–145)

## 2018-03-19 MED ORDER — LEVALBUTEROL HCL 0.63 MG/3ML IN NEBU
0.6300 mg | INHALATION_SOLUTION | Freq: Four times a day (QID) | RESPIRATORY_TRACT | Status: DC
  Administered 2018-03-19 – 2018-03-24 (×16): 0.63 mg via RESPIRATORY_TRACT
  Filled 2018-03-19 (×16): qty 3

## 2018-03-19 NOTE — Progress Notes (Addendum)
PROGRESS NOTE  PRADEEP BEAUBRUN ZOX:[id number] DOB: 07-04-1940 DOA: 04/05/18 PCP: Craig Staggers, MD  Brief Narrative: Mario Walker. [name] is a 77 year old male with medical history significant for COPD chronic respiratory failure on home O2 at 2 L/min atrial fibrillation type 2 diabetes mellitus.  He was admitted from his primary care doctor's follow-up office for tachycardia.  On arrival in the emergency department he was found to be in atrial fibrillation with rapid ventricular rate shortness of breath and intermittent chest pain and tightness.   Interval history/Subjective: Patient seen at bedside and examined he is sitting up in the chair and at the bedside.  He is complaining that he is sleeping a lot he wanted to know if we will give him something to make him sleep.  I reviewed his medication and advised him that he did not have him on anything that could make him sleep really think possibly is the Cymbalta which she has been taking for a long time and he said it never made him sleepy.  Otherwise he said he feels pretty good.    Assessment/Plan   Principal Problem:   Atrial fibrillation with RVR (HCC) Active Problems:   CKD (chronic kidney disease) stage 3, GFR 30-59 ml/min (HCC)   Essential hypertension   Diabetes mellitus without complication (HCC)   Elevated troponin   Acute on chronic diastolic CHF (congestive heart failure) (HCC)   Chronic respiratory failure with hypoxia (HCC)   COPD (chronic obstructive pulmonary disease) (HCC)   Chronic diastolic CHF (congestive heart failure) (HCC)   Cor pulmonale (chronic) (HCC)   Atrial fibrillation with rapid ventricular response (HCC)   Acute on chronic respiratory failure with hypoxia (HCC)   1.Paroxysmal atrial fibrillation with rapid ventricular rate patient was on Cardizem at home he was started on Cardizem drip but that was discontinued and they placed him back on his home dose of Cardizem and metoprolol. cardiology was  consulted  2.  Acute on chronic respiratory failure patient was hypoxic on 6 L O2 while sleeping. Oxygenation is improved ,still on 6L/m O2 ,and no respiratory distress  3.  Acute on chronic congestive heart failure.  Probably triggered by his atrial fibrillation with rapid ventricular rate.  He had a 2D echo February 07, 2018 that showed EF of 55-60 with diastolic dysfunction and mild dilated right atrium moderate dilated right ventricle PA peak pressure was 65 mmHg.  He is torsemide was discontinued and he was placed on Lasix 20 mg IV twice a day.  He was continued on Cardizem and Coreg.  4.Acute renal failure on chronic renal disease stage III may be secondary to the hypotension due to the Cardizem drip will follow renal function I will order renal ultrasound.  5.  Hypertension continue home medication it is improved   6. diabetes mellitus continue current Lantus and meal coverage with sliding scale  7.  Severe pulmonary hypertension cardiology consulted.  Patient will follow with his pulmonologist. cardiology recommended  For outpatient VQ scan and RIGHT HEART CARDIAC CATHERIZATION.  9.  Mild hyponatremia of 133 we will continue to monitor he is on Lasix hopefully that will help to reduce his volume   DVT prophylaxis: Eliquis Code Status: Full code Family Communication: No family at bedside Disposition Plan: Patient will remain in stepdown unit probably discharged home when medically stable  Consultants:  Cardiology  Procedures:  Chest x-ray  Antimicrobials:  None   Objective: Vitals:  Vitals:   03/19/18 0805 03/19/18 1105  BP:  Pulse:    Resp:    Temp: 98 F (36.7 C) (!) 97.4 F (36.3 C)  SpO2:      Exam:  Constitutional:  . Appears calm and comfortable Eyes:  . pupils and irises appear normal . Normal lids and conjunctivae ENMT:  . grossly normal hearing  . Lips appear normal . external ears, nose appear normal . Oropharynx: mucosa, tongue,posterior  pharynx appear normal Neck:  . neck appears normal, no masses, normal ROM, supple . no thyromegaly Respiratory:  . Bilateral rales . Respiratory effort normal. No retractions or accessory muscle use Cardiovascular:  . RRR, no m/r/g . +1 LE extremity edema   . Normal pedal pulses Abdomen:  . Abdomen appears normal; no tenderness or masses . No hernias . No HSM Musculoskeletal:  . Digits/nails BUE: no clubbing, cyanosis, petechiae, infection . exam of joints, bones, muscles of at least one of following: head/neck, RUE, LUE, RLE, LLE   o strength and tone normal, no atrophy, no abnormal movements o No tenderness, masses o Normal ROM, no contractures  . gait and station Skin:  . No rashes, lesions, ulcers . palpation of skin: no induration or nodules Neurologic:  . CN 2-12 intact . Sensation all 4 extremities intact Psychiatric:  . Mental status o Mood, affect appropriate o Orientation to person, place, time  . judgment and insight appear intact     I have personally reviewed the following:   Data: Results for TAVI, HOOGENDOORN (MRN [id number]) as of 03/19/2018 19:15  Ref. Range 03/19/2018 04:31  BASIC METABOLIC PANEL Unknown Rpt (A)  Sodium Latest Ref Range: 135 - 145 mmol/L 133 (L)  Potassium Latest Ref Range: 3.5 - 5.1 mmol/L 4.1  Chloride Latest Ref Range: 98 - 111 mmol/L 95 (L)  CO2 Latest Ref Range: 22 - 32 mmol/L 27  Glucose Latest Ref Range: 70 - 99 mg/dL 782 (H)  BUN Latest Ref Range: 8 - 23 mg/dL 40 (H)  Creatinine Latest Ref Range: 0.61 - 1.24 mg/dL 9.56 (H)  Calcium Latest Ref Range: 8.9 - 10.3 mg/dL 8.7 (L)  Anion gap Latest Ref Range: 5 - 15  11  Magnesium Latest Ref Range: 1.7 - 2.4 mg/dL 1.9  GFR, Est Non African American Latest Ref Range: >60 mL/min 33 (L)  GFR, Est African American Latest Ref Range: >60 mL/min 38 (L)  WBC Latest Ref Range: 4.0 - 10.5 K/uL 10.5  RBC Latest Ref Range: 4.22 - 5.81 MIL/uL 3.72 (L)  Hemoglobin Latest Ref Range: 13.0 - 17.0  g/dL 21.3 (L)  HCT Latest Ref Range: 39.0 - 52.0 % 33.3 (L)  MCV Latest Ref Range: 78.0 - 100.0 fL 89.5  MCH Latest Ref Range: 26.0 - 34.0 pg 27.4  MCHC Latest Ref Range: 30.0 - 36.0 g/dL 08.6  RDW Latest Ref Range: 11.5 - 15.5 % 15.2  Platelets Latest Ref Range: 150 - 400 K/uL 278  Neutrophils Latest Units: % 74  Lymphocytes Latest Units: % 16  Monocytes Relative Latest Units: % 9  Eosinophil Latest Units: % 1  Basophil Latest Units: % 0  NEUT# Latest Ref Range: 1.7 - 7.7 K/uL 7.7  Lymphocyte # Latest Ref Range: 0.7 - 4.0 K/uL 1.7  Monocyte # Latest Ref Range: 0.1 - 1.0 K/uL 1.0  Eosinophils Absolute Latest Ref Range: 0.0 - 0.7 K/uL 0.1  Basophils Absolute Latest Ref Range: 0.0 - 0.1 K/uL 0.0  .   Scheduled Meds: . apixaban  5 mg Oral BID  . atorvastatin  40 mg Oral QPM  . carvedilol  25 mg Oral BID  . diltiazem  120 mg Oral Daily  . DULoxetine  60 mg Oral Daily  . furosemide  20 mg Intravenous Q12H  . insulin aspart  0-15 Units Subcutaneous TID WC  . insulin aspart  0-5 Units Subcutaneous QHS  . insulin aspart  5 Units Subcutaneous TID AC  . insulin glargine  35 Units Subcutaneous QHS  . levalbuterol  0.63 mg Nebulization Q6H WA  . multivitamin with minerals  1 tablet Oral Daily  . pantoprazole  80 mg Oral Daily  . vitamin C  500 mg Oral Daily   Continuous Infusions:   LOS: 1 day  Dr. Barrie Folk [facility] Pager [phone]  03/19/2018, 11:36 AM  LOS: 1 day

## 2018-03-20 ENCOUNTER — Inpatient Hospital Stay (HOSPITAL_COMMUNITY): Payer: Medicare HMO

## 2018-03-20 LAB — CBC WITH DIFFERENTIAL/PLATELET
BASOS PCT: 0 %
Basophils Absolute: 0 10*3/uL (ref 0.0–0.1)
EOS ABS: 0.1 10*3/uL (ref 0.0–0.7)
Eosinophils Relative: 1 %
HEMATOCRIT: 33.1 % — AB (ref 39.0–52.0)
Hemoglobin: 10 g/dL — ABNORMAL LOW (ref 13.0–17.0)
Lymphocytes Relative: 17 %
Lymphs Abs: 1.9 10*3/uL (ref 0.7–4.0)
MCH: 27 pg (ref 26.0–34.0)
MCHC: 30.2 g/dL (ref 30.0–36.0)
MCV: 89.5 fL (ref 78.0–100.0)
MONO ABS: 0.8 10*3/uL (ref 0.1–1.0)
MONOS PCT: 7 %
NEUTROS ABS: 8.5 10*3/uL — AB (ref 1.7–7.7)
Neutrophils Relative %: 75 %
Platelets: 282 10*3/uL (ref 150–400)
RBC: 3.7 MIL/uL — ABNORMAL LOW (ref 4.22–5.81)
RDW: 15.2 % (ref 11.5–15.5)
WBC: 11.3 10*3/uL — AB (ref 4.0–10.5)

## 2018-03-20 LAB — BASIC METABOLIC PANEL
ANION GAP: 9 (ref 5–15)
BUN: 45 mg/dL — ABNORMAL HIGH (ref 8–23)
CALCIUM: 8.5 mg/dL — AB (ref 8.9–10.3)
CO2: 27 mmol/L (ref 22–32)
CREATININE: 1.83 mg/dL — AB (ref 0.61–1.24)
Chloride: 95 mmol/L — ABNORMAL LOW (ref 98–111)
GFR calc Af Amer: 39 mL/min — ABNORMAL LOW (ref >60)
GFR calc non Af Amer: 34 mL/min — ABNORMAL LOW (ref >60)
Glucose, Bld: 128 mg/dL — ABNORMAL HIGH (ref 70–99)
Potassium: 4.3 mmol/L (ref 3.5–5.1)
SODIUM: 131 mmol/L — AB (ref 135–145)

## 2018-03-20 LAB — GLUCOSE, CAPILLARY
GLUCOSE-CAPILLARY: 339 mg/dL — AB (ref 70–99)
GLUCOSE-CAPILLARY: 68 mg/dL — AB (ref 70–99)
Glucose-Capillary: 172 mg/dL — ABNORMAL HIGH (ref 70–99)
Glucose-Capillary: 149 mg/dL — ABNORMAL HIGH (ref 70–99)
Glucose-Capillary: 236 mg/dL — ABNORMAL HIGH (ref 70–99)

## 2018-03-20 NOTE — Progress Notes (Signed)
PROGRESS NOTE  Mario Walker ZOX:[id number] DOB: 04-24-1941 DOA: 03/22/18 PCP: Craig Staggers, MD  Brief Narrative: [name]. Haughn is a 77 year old male with medical history significant for COPD chronic respiratory failure on home O2 at 2 L/min atrial fibrillation type 2 diabetes mellitus.  He was admitted from his primary care doctor's follow-up office for tachycardia.  On arrival in the emergency department he was found to be in atrial fibrillation with rapid ventricular rate shortness of breath and intermittent chest pain and tightness.   Interval history/Subjective: Patient seen at bedside and examined he is sitting up in the chair and at the bedside.  He is complaining that he is sleeping a lot he wanted to know if we will give him something to make him sleep.  I reviewed his medication and advised him that he did not have him on anything that could make him sleep really think possibly is the Cymbalta which she has been taking for a long time and he said it never made him sleepy.  Otherwise he said he feels pretty good.   03/20/18: seen and examined at bedside. Still coughing, denies SOB , swelling of feet is better    Assessment/Plan   Principal Problem:   Atrial fibrillation with RVR (HCC) Active Problems:   CKD (chronic kidney disease) stage 3, GFR 30-59 ml/min (HCC)   Essential hypertension   Diabetes mellitus without complication (HCC)   Elevated troponin   Acute on chronic diastolic CHF (congestive heart failure) (HCC)   Chronic respiratory failure with hypoxia (HCC)   COPD (chronic obstructive pulmonary disease) (HCC)   Chronic diastolic CHF (congestive heart failure) (HCC)   Cor pulmonale (chronic) (HCC)   Atrial fibrillation with rapid ventricular response (HCC)   Acute on chronic respiratory failure with hypoxia (HCC)   1.Paroxysmal atrial fibrillation with rapid ventricular rate patient was on Cardizem at home he was started on Cardizem drip but that was  discontinued and they placed him back on his home dose of Cardizem and metoprolol. cardiology was consulted  2.  Acute on chronic respiratory failure patient was hypoxic on 6 L O2 while sleeping. Oxygenation is improved ,still on 6L/m O2 ,and no respiratory distress  3.  Acute on chronic congestive heart failure.  Probably triggered by his atrial fibrillation with rapid ventricular rate.  He had a 2D echo February 07, 2018 that showed EF of 55-60 with diastolic dysfunction and mild dilated right atrium moderate dilated right ventricle PA peak pressure was 65 mmHg.  He is torsemide was discontinued and he was placed on Lasix 20 mg IV twice a day.  He was continued on Cardizem and Coreg.  4.Acute renal failure on chronic renal disease stage III may be secondary to the hypotension due to the Cardizem drip will follow renal function I will order renal ultrasound.  5.  Hypertension continue home medication it is improved   6. diabetes mellitus continue current Lantus and meal coverage with sliding scale  7.  Severe pulmonary hypertension cardiology consulted.  Patient will follow with his pulmonologist. cardiology recommended  for outpatient VQ scan and RIGHT HEART CARDIAC CATHERIZATION.  9.  Mild hyponatremia of 133 we will continue to monitor he is on Lasix hopefully that will help to reduce his volume   DVT prophylaxis: Eliquis Code Status: Full code Family Communication:wife and  family at bedside Disposition Plan: Patient will remain in stepdown unit probably discharged home when medically stable  Consultants:  Cardiology  Procedures:  Chest x-ray  Antimicrobials:  None   Objective: Vitals:  Vitals:   03/20/18 0400 03/20/18 0835  BP: 104/68   Pulse: 75   Resp:    Temp: 97.7 F (36.5 C)   SpO2: 95% 98%    Exam:  Constitutional:  . Appears calm and comfortable Eyes:  . pupils and irises appear normal . Normal lids and conjunctivae ENMT:  . grossly normal hearing   . Lips appear normal . external ears, nose appear normal . Oropharynx: mucosa, tongue,posterior pharynx appear normal Neck:  . neck appears normal, no masses, normal ROM, supple . no thyromegaly Respiratory:  . Bilateral rales . Respiratory effort normal. No retractions or accessory muscle use Cardiovascular:  . RRR, no m/r/g . +1 LE extremity edema   . Normal pedal pulses Abdomen:  . Abdomen appears normal; no tenderness or masses . No hernias . No HSM Musculoskeletal:  . Digits/nails BUE: no clubbing, cyanosis, petechiae, infection . exam of joints, bones, muscles of at least one of following: head/neck, RUE, LUE, RLE, LLE   o strength and tone normal, no atrophy, no abnormal movements o No tenderness, masses o Normal ROM, no contractures  . gait and station Skin:  . No rashes, lesions, ulcers . palpation of skin: no induration or nodules Neurologic:  . CN 2-12 intact . Sensation all 4 extremities intact Psychiatric:  . Mental status o Mood, affect appropriate o Orientation to person, place, time  . judgment and insight appear intact     I have personally reviewed the following:   Data:  CLINICAL DATA:  Acute renal injury  EXAM: RENAL / URINARY TRACT ULTRASOUND COMPLETE  COMPARISON:  CT 02/06/2018  FINDINGS: Right Kidney:  Length: 10.7 cm. Echogenicity within normal limits. No mass or hydronephrosis visualized.  Left Kidney:  Length: 10.4 cm. Echogenicity within normal limits. No mass or hydronephrosis visualized. Mild renal cortical thinning.  Bladder:  Appears normal for degree of bladder distention.  IMPRESSION: 1. No hydronephrosis. 2. Renal cortical thinning more prominent on the LEFT.   Electronically Signed   By: Genevive Bi M.D.   On: 03/20/2018 14:26  Scheduled Meds: . apixaban  5 mg Oral BID  . atorvastatin  40 mg Oral QPM  . carvedilol  25 mg Oral BID  . diltiazem  120 mg Oral Daily  . DULoxetine  60 mg Oral  Daily  . furosemide  20 mg Intravenous Q12H  . insulin aspart  0-15 Units Subcutaneous TID WC  . insulin aspart  0-5 Units Subcutaneous QHS  . insulin aspart  5 Units Subcutaneous TID AC  . insulin glargine  35 Units Subcutaneous QHS  . levalbuterol  0.63 mg Nebulization Q6H WA  . multivitamin with minerals  1 tablet Oral Daily  . pantoprazole  80 mg Oral Daily  . vitamin C  500 mg Oral Daily   Continuous Infusions:   LOS: 2 days  Dr. Barrie Folk [facility] Pager [phone]  03/19/2018, 11:36 AM  LOS: 1 day

## 2018-03-21 ENCOUNTER — Inpatient Hospital Stay (HOSPITAL_COMMUNITY): Payer: Medicare HMO

## 2018-03-21 LAB — BASIC METABOLIC PANEL
Anion gap: 9 (ref 5–15)
BUN: 43 mg/dL — AB (ref 8–23)
CHLORIDE: 94 mmol/L — AB (ref 98–111)
CO2: 29 mmol/L (ref 22–32)
Calcium: 8.9 mg/dL (ref 8.9–10.3)
Creatinine, Ser: 2.09 mg/dL — ABNORMAL HIGH (ref 0.61–1.24)
GFR calc Af Amer: 33 mL/min — ABNORMAL LOW (ref >60)
GFR calc non Af Amer: 29 mL/min — ABNORMAL LOW (ref >60)
GLUCOSE: 201 mg/dL — AB (ref 70–99)
POTASSIUM: 5.7 mmol/L — AB (ref 3.5–5.1)
SODIUM: 132 mmol/L — AB (ref 135–145)

## 2018-03-21 LAB — CBC WITH DIFFERENTIAL/PLATELET
BASOS ABS: 0 10*3/uL (ref 0.0–0.1)
Basophils Relative: 0 %
Eosinophils Absolute: 0.1 10*3/uL (ref 0.0–0.7)
Eosinophils Relative: 1 %
HCT: 32.1 % — ABNORMAL LOW (ref 39.0–52.0)
HEMOGLOBIN: 9.7 g/dL — AB (ref 13.0–17.0)
Lymphocytes Relative: 17 %
Lymphs Abs: 1.9 10*3/uL (ref 0.7–4.0)
MCH: 27.1 pg (ref 26.0–34.0)
MCHC: 30.2 g/dL (ref 30.0–36.0)
MCV: 89.7 fL (ref 78.0–100.0)
MONOS PCT: 9 %
Monocytes Absolute: 1 10*3/uL (ref 0.1–1.0)
NEUTROS ABS: 7.9 10*3/uL — AB (ref 1.7–7.7)
NEUTROS PCT: 73 %
Platelets: 278 10*3/uL (ref 150–400)
RBC: 3.58 MIL/uL — ABNORMAL LOW (ref 4.22–5.81)
RDW: 15.2 % (ref 11.5–15.5)
WBC: 10.9 10*3/uL — AB (ref 4.0–10.5)

## 2018-03-21 LAB — GLUCOSE, CAPILLARY
GLUCOSE-CAPILLARY: 130 mg/dL — AB (ref 70–99)
Glucose-Capillary: 223 mg/dL — ABNORMAL HIGH (ref 70–99)
Glucose-Capillary: 177 mg/dL — ABNORMAL HIGH (ref 70–99)
Glucose-Capillary: 167 mg/dL — ABNORMAL HIGH (ref 70–99)

## 2018-03-21 MED ORDER — SODIUM POLYSTYRENE SULFONATE 15 GM/60ML PO SUSP
30.0000 g | Freq: Once | ORAL | Status: AC
  Administered 2018-03-21: 30 g via ORAL
  Filled 2018-03-21: qty 120

## 2018-03-21 NOTE — Progress Notes (Signed)
PROGRESS NOTE    Mario LOESCHER  Walker:[id number] DOB: 07/27/1940 DOA: 2018-04-03 PCP: Craig Staggers, MD   Brief Narrative:   Mario Walker. Limbaugh is a 77 year old male with medical history significant for COPD chronic respiratory failure on home O2 at 2 L/min atrial fibrillation type 2 diabetes mellitus.  He was admitted from Dr. Juanetta Gosling office for tachycardia and dyspnea.  On arrival in the emergency department he was found to be in atrial fibrillation with rapid ventricular rate shortness of breath and intermittent chest pain and tightness.  Assessment & Plan:   Principal Problem:   Atrial fibrillation with RVR (HCC) Active Problems:   CKD (chronic kidney disease) stage 3, GFR 30-59 ml/min (HCC)   Essential hypertension   Diabetes mellitus without complication (HCC)   Elevated troponin   Acute on chronic diastolic CHF (congestive heart failure) (HCC)   Chronic respiratory failure with hypoxia (HCC)   COPD (chronic obstructive pulmonary disease) (HCC)   Chronic diastolic CHF (congestive heart failure) (HCC)   Cor pulmonale (chronic) (HCC)   Atrial fibrillation with rapid ventricular response (HCC)   Acute on chronic respiratory failure with hypoxia (HCC)   1.Paroxysmal atrial fibrillation with rapid ventricular rate patient was on Cardizem at home he was started on Cardizem drip but that was discontinued and they placed him back on his home dose of Cardizem and metoprolol.  Appreciate cardiology recommendations to continue the same.  2.  Acute on chronic respiratory failure patient was hypoxic on 6 L O2 while sleeping. Oxygenation is improved ,still on 6L/m O2 ,and no respiratory distress.  Discussed with Dr. Juanetta Gosling who feels concentrator will be needed and will follow up with patient for pulmonology consultation in outpatient setting.  3.  Acute on chronic congestive heart failure.  Probably triggered by his atrial fibrillation with rapid ventricular rate.  He had a 2D echo February 07, 2018 that showed EF of 55-60 with diastolic dysfunction and mild dilated right atrium moderate dilated right ventricle PA peak pressure was 65 mmHg.    Continue Cardizem and Coreg and DC Lasix today due to worsening renal function per cardiology.  4.Acute renal failure on chronic renal disease stage III may be secondary to the hypotension due to the Cardizem drip will follow renal function I will order renal ultrasound.  5.  Hypertension continue home medication it is improved   6. diabetes mellitus continue current Lantus and meal coverage with sliding scale  7.  Severe pulmonary hypertension cardiology consulted.  Patient will follow with his pulmonologist. cardiology recommended  for outpatient VQ scan and RIGHT HEART CARDIAC CATHERIZATION.  9.  Mild hyponatremia of 133 we will continue to monitor he is on Lasix hopefully that will help to reduce his volume.  Lasix discontinued today.  Recheck BMP in a.m.   DVT prophylaxis: Eliquis Code Status: Partial code Family Communication: None at bedside Disposition Plan: Shortness of breath is improving.  Plan hold diuresis as noted per cardiology note and follow renal function.  Treatment of hyperkalemia.  Ambulation with need for high flow O2 concentrator at home prior to discharge hopefully next 1 to 2 days.  Transfer to telemetry floor.   Consultants:   Cardiology  Procedures:   None  Antimicrobials:   None   Subjective: Patient seen and evaluated today with no new acute complaints or concerns. No acute concerns or events noted overnight.  He is currently on 6 L nasal cannula with no significant symptomatology noted this morning.  Objective: Vitals:  03/21/18 0700 03/21/18 0800 03/21/18 0900 03/21/18 1209  BP:      Pulse: 68 81 72   Resp: 16 19    Temp:   98.4 F (36.9 C) 97.6 F (36.4 C)  TempSrc:    Oral  SpO2: 97% 90% 97%   Weight:      Height:        Intake/Output Summary (Last 24 hours) at 03/21/2018  1224 Last data filed at 03/21/2018 1209 Gross per 24 hour  Intake 360 ml  Output 1000 ml  Net -640 ml   Filed Weights   03/19/18 0400 03/20/18 0400 03/21/18 0400  Weight: 92.6 kg 93.6 kg 93.8 kg    Examination:  General exam: Appears calm and comfortable  Respiratory system: Clear to auscultation. Respiratory effort normal. On 6L [place]. Cardiovascular system: S1 & S2 heard, RRR. No JVD, murmurs, rubs, gallops or clicks. No pedal edema. Gastrointestinal system: Abdomen is nondistended, soft and nontender. No organomegaly or masses felt. Normal bowel sounds heard. Central nervous system: Alert and oriented. No focal neurological deficits. Extremities: Symmetric 5 x 5 power. Skin: No rashes, lesions or ulcers Psychiatry: Judgement and insight appear normal. Mood & affect appropriate.     Data Reviewed: I have personally reviewed following labs and imaging studies  CBC: Recent Labs  Lab 03/17/18 1450 03/18/18 0405 03/19/18 0431 03/20/18 0502 03/21/18 0354  WBC 11.2* 10.4 10.5 11.3* 10.9*  NEUTROABS 8.0* 7.2 7.7 8.5* 7.9*  HGB 10.7* 10.3* 10.2* 10.0* 9.7*  HCT 35.1* 33.7* 33.3* 33.1* 32.1*  MCV 90.5 89.9 89.5 89.5 89.7  PLT 328 283 278 282 278   Basic Metabolic Panel: Recent Labs  Lab 03/17/18 1450 03/18/18 0405 03/19/18 0431 03/20/18 0502 03/21/18 0354  NA 131* 133* 133* 131* 132*  K 4.3 4.6 4.1 4.3 5.7*  CL 94* 94* 95* 95* 94*  CO2 27 29 27 27 29   GLUCOSE 132* 126* 109* 128* 201*  BUN 36* 44* 40* 45* 43*  CREATININE 2.28* 2.29* 1.86* 1.83* 2.09*  CALCIUM 8.5* 8.6* 8.7* 8.5* 8.9  MG  --   --  1.9  --   --    GFR: Estimated Creatinine Clearance: 31.2 mL/min (A) (by C-G formula based on SCr of 2.09 mg/dL (H)). Liver Function Tests: Recent Labs  Lab Mar 24, 2018 1758  AST 20  ALT 15  ALKPHOS 71  BILITOT 1.0  PROT 7.8  ALBUMIN 3.6   No results for input(s): LIPASE, AMYLASE in the last 168 hours. No results for input(s): AMMONIA in the last 168  hours. Coagulation Profile: No results for input(s): INR, PROTIME in the last 168 hours. Cardiac Enzymes: Recent Labs  Lab 24-Mar-2018 1758 03/17/18 0005 03/17/18 0310  TROPONINI 0.44* 0.60* 0.63*   BNP (last 3 results) No results for input(s): PROBNP in the last 8760 hours. HbA1C: No results for input(s): HGBA1C in the last 72 hours. CBG: Recent Labs  Lab 03/20/18 1701 03/20/18 1834 03/20/18 2137 03/21/18 0815 03/21/18 1126  GLUCAP 68* 149* 236* 130* 223*   Lipid Profile: No results for input(s): CHOL, HDL, LDLCALC, TRIG, CHOLHDL, LDLDIRECT in the last 72 hours. Thyroid Function Tests: No results for input(s): TSH, T4TOTAL, FREET4, T3FREE, THYROIDAB in the last 72 hours. Anemia Panel: No results for input(s): VITAMINB12, FOLATE, FERRITIN, TIBC, IRON, RETICCTPCT in the last 72 hours. Sepsis Labs: No results for input(s): PROCALCITON, LATICACIDVEN in the last 168 hours.  Recent Results (from the past 240 hour(s))  Culture, Urine     Status: None  Collection Time: 03/18/18  9:01 AM  Result Value Ref Range Status   Specimen Description   Final    URINE, RANDOM Performed at [facility], [address], [place], [place] [address]    Special Requests   Final    NONE Performed at [facility], [address], [place], [place] [address]    Culture   Final    Multiple bacterial morphotypes present, none predominant. Suggest appropriate recollection if clinically indicated.   Report Status 03/19/2018 FINAL  Final         Radiology Studies: US Renal  Result Date: 03/20/2018 CLINICAL DATA:  Acute renal injury EXAM: RENAL / URINARY TRACT ULTRASOUND COMPLETE COMPARISON:  CT 02/06/2018 FINDINGS: Right Kidney: Length: 10.7 cm. Echogenicity within normal limits. No mass or hydronephrosis visualized. Left Kidney: Length: 10.4 cm. Echogenicity within normal limits. No mass or hydronephrosis visualized. Mild renal cortical thinning. Bladder: Appears normal for degree of bladder  distention. IMPRESSION: 1. No hydronephrosis. 2. Renal cortical thinning more prominent on the LEFT. Electronically Signed   By: Genevive Bi M.D.   On: 03/20/2018 14:26   Dg Chest Port 1 View  Result Date: 03/21/2018 CLINICAL DATA:  COPD, CHF EXAM: PORTABLE CHEST 1 VIEW COMPARISON:  03/18/2018 FINDINGS: Cardiomegaly with vascular congestion. Worsening bilateral airspace opacities, likely worsening edema. Suspect small layering effusions. No acute bony abnormality. IMPRESSION: Worsening bilateral airspace disease, likely worsening CHF. Small effusions. Electronically Signed   By: Charlett Nose M.D.   On: 03/21/2018 07:23        Scheduled Meds: . apixaban  5 mg Oral BID  . atorvastatin  40 mg Oral QPM  . carvedilol  25 mg Oral BID  . diltiazem  120 mg Oral Daily  . DULoxetine  60 mg Oral Daily  . insulin aspart  0-15 Units Subcutaneous TID WC  . insulin aspart  0-5 Units Subcutaneous QHS  . insulin aspart  5 Units Subcutaneous TID AC  . insulin glargine  35 Units Subcutaneous QHS  . levalbuterol  0.63 mg Nebulization Q6H WA  . multivitamin with minerals  1 tablet Oral Daily  . pantoprazole  80 mg Oral Daily  . sodium polystyrene  30 g Oral Once  . vitamin C  500 mg Oral Daily   Continuous Infusions:   LOS: 3 days    Time spent: 30 minutes    Pratik Hoover Brunette, DO [facility] Pager [phone]  If 7PM-7AM, please contact night-coverage [email or web address] Password [email or web address] 03/21/2018, 12:24 PM

## 2018-03-21 NOTE — Care Management Note (Signed)
Case Management Note  Patient Details  Name: Mario Walker MRN: [id number] Date of Birth: 1940-09-04  Subjective/Objective:                    Action/Plan: Discussed oxygen with patient, He is aware his concentrator will go up to 10 Liter and that [facility] offers to come out if he needs him with regulator. We discuss his 3 admission in six months and home health RN for follow. Patient declines.  Will alert CM is he changes his mind.    Expected Discharge Date:    03/22/2018              Expected Discharge Plan:  Home/Self Care  In-House Referral:     Discharge planning Services  CM Consult  Post Acute Care Choice:  Durable Medical Equipment Choice offered to:  Patient  DME Arranged:  Oxygen DME Agency:  Other - Comment([facility])  HH Arranged:    HH Agency:     Status of Service:  Completed, signed off  If discussed at Long Length of Stay Meetings, dates discussed:    Additional Comments:  Sherlin Sonier, Chrystine Oiler, RN 03/21/2018, 11:54 AM

## 2018-03-21 NOTE — Progress Notes (Signed)
Progress Note  Patient Name: Mario Walker Date of Encounter: 03/21/2018  Primary Cardiologist: Nona Dell, MD   Subjective   SOB is improving.   Inpatient Medications    Scheduled Meds: . apixaban  5 mg Oral BID  . atorvastatin  40 mg Oral QPM  . carvedilol  25 mg Oral BID  . diltiazem  120 mg Oral Daily  . DULoxetine  60 mg Oral Daily  . furosemide  20 mg Intravenous Q12H  . insulin aspart  0-15 Units Subcutaneous TID WC  . insulin aspart  0-5 Units Subcutaneous QHS  . insulin aspart  5 Units Subcutaneous TID AC  . insulin glargine  35 Units Subcutaneous QHS  . levalbuterol  0.63 mg Nebulization Q6H WA  . multivitamin with minerals  1 tablet Oral Daily  . pantoprazole  80 mg Oral Daily  . vitamin C  500 mg Oral Daily   Continuous Infusions:  PRN Meds: acetaminophen, albuterol, fluticasone, HYDROcodone-acetaminophen, ondansetron (ZOFRAN) IV   Vital Signs    Vitals:   03/21/18 0000 03/21/18 0200 03/21/18 0400 03/21/18 0600  BP: (!) 101/55 117/77 108/71 120/72  Pulse: (!) 58 63 62 71  Resp: 14 (!) 22 18 16   Temp: 98 F (36.7 C)  98.6 F (37 C)   TempSrc: Oral  Oral   SpO2: 95% 95% 96% 97%  Weight:   93.8 kg   Height:        Intake/Output Summary (Last 24 hours) at 03/21/2018 0812 Last data filed at 03/21/2018 0223 Gross per 24 hour  Intake 360 ml  Output 700 ml  Net -340 ml   Filed Weights   03/19/18 0400 03/20/18 0400 03/21/18 0400  Weight: 92.6 kg 93.6 kg 93.8 kg    Telemetry    Rate controlled afib - Personally Reviewed  ECG      Physical Exam   GEN: No acute distress.   Neck: No JVD Cardiac: irreg  Respiratory: mild faint bilateral crackles.  GI: Soft, nontender, non-distended  MS: trace bilateral edema; No deformity. Neuro:  Nonfocal  Psych: Normal affect   Labs    Chemistry Recent Labs  Lab 03-18-18 1758  03/19/18 0431 03/20/18 0502 03/21/18 0354  NA 132*   < > 133* 131* 132*  K 3.6   < > 4.1 4.3 5.7*  CL 92*   < >  95* 95* 94*  CO2 28   < > 27 27 29   GLUCOSE 322*   < > 109* 128* 201*  BUN 25*   < > 40* 45* 43*  CREATININE 1.43*   < > 1.86* 1.83* 2.09*  CALCIUM 8.9   < > 8.7* 8.5* 8.9  PROT 7.8  --   --   --   --   ALBUMIN 3.6  --   --   --   --   AST 20  --   --   --   --   ALT 15  --   --   --   --   ALKPHOS 71  --   --   --   --   BILITOT 1.0  --   --   --   --   GFRNONAA 45*   < > 33* 34* 29*  GFRAA 53*   < > 38* 39* 33*  ANIONGAP 12   < > 11 9 9    < > = values in this interval not displayed.     Hematology Recent Labs  Lab 03/19/18 0431 03/20/18 0502 03/21/18 0354  WBC 10.5 11.3* 10.9*  RBC 3.72* 3.70* 3.58*  HGB 10.2* 10.0* 9.7*  HCT 33.3* 33.1* 32.1*  MCV 89.5 89.5 89.7  MCH 27.4 27.0 27.1  MCHC 30.6 30.2 30.2  RDW 15.2 15.2 15.2  PLT 278 282 278    Cardiac Enzymes Recent Labs  Lab March 30, 2018 1758 03/17/18 0005 03/17/18 0310  TROPONINI 0.44* 0.60* 0.63*   No results for input(s): TROPIPOC in the last 168 hours.   BNP Recent Labs  Lab Mar 30, 2018 1745  BNP 583.0*     DDimer No results for input(s): DDIMER in the last 168 hours.   Radiology    US Renal  Result Date: 03/20/2018 CLINICAL DATA:  Acute renal injury EXAM: RENAL / URINARY TRACT ULTRASOUND COMPLETE COMPARISON:  CT 02/06/2018 FINDINGS: Right Kidney: Length: 10.7 cm. Echogenicity within normal limits. No mass or hydronephrosis visualized. Left Kidney: Length: 10.4 cm. Echogenicity within normal limits. No mass or hydronephrosis visualized. Mild renal cortical thinning. Bladder: Appears normal for degree of bladder distention. IMPRESSION: 1. No hydronephrosis. 2. Renal cortical thinning more prominent on the LEFT. Electronically Signed   By: Genevive Bi M.D.   On: 03/20/2018 14:26   Dg Chest Port 1 View  Result Date: 03/21/2018 CLINICAL DATA:  COPD, CHF EXAM: PORTABLE CHEST 1 VIEW COMPARISON:  03/18/2018 FINDINGS: Cardiomegaly with vascular congestion. Worsening bilateral airspace opacities, likely  worsening edema. Suspect small layering effusions. No acute bony abnormality. IMPRESSION: Worsening bilateral airspace disease, likely worsening CHF. Small effusions. Electronically Signed   By: Charlett Nose M.D.   On: 03/21/2018 07:23    Cardiac Studies    Patient Profile     Mario Walker is a 77 y.o. 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 Janee Morn.  Assessment & Plan    1. Afib/aflutter with RVR - rate contorl with coreg and diltiazem, on eliquis for stroke prevention - may consider attempt at DCCV once on anticoag 3 weeks and pending respiratory status   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   3. Pulmonary HTN with right heart failure - 01/2018 echo LVE 55-60%, no WMAs, abnormal diastolic function. Mild to mod RV dilatation, PASP 65, mod MR. Septum shows evidence of RV pressure overload.  - likely primarily group 3 pulm HTN given his lung disease, perhaps some component of group 2 given some evidence of diastolic HF. No strong indication for pulmonary vasodilators at this time.  - can consider outpatient VQ, perhaps RHC pending f/u with primary cardiologist.     4. AKI on CKD - worsening renal function.   5. Elevated troponin - up to 0.63 in setting of tachycardia and severe hypoxia. Certaintly factors that could lead to demand ishcemia - echo with normal LVEF, no WMAs - with poor renal function not candidate for cath at this time. Difficult candidate for noninvasive testing as well. Caution with regadnoson given his lung disease, would avoid dobutamine due to his difficult to control afib, renal function prohibits contrast for  coronary CTA. Could consider RHC/LHC as outpatient once renal function has gone back to baseline. No plans for inpatient ischemic testing at this time.    6.  Acute diastolic HF - has been on IV lasix, with worsening renal function will d/c today.  - review echo images today.    For questions or updates, please contact [facility] Please consult [email or web address] for contact info under        Signed, Dina Rich, MD  03/21/2018, 8:12 AM

## 2018-03-21 NOTE — Progress Notes (Signed)
Pt was ambulated around the unit on tele monitor and 6L of o2. Pt tolerated ambulating well. HR when resting was 77 the highest it got was 142 I instructed patient to take some breathes and rest a minute HR then averaged out around 110 and stayed in that range while ambulating back to room. O2 sat stayed good they averaged around 95% the whole trip.   Philomena Doheny, RN

## 2018-03-21 NOTE — Care Management Important Message (Signed)
Important Message  Patient Details  Name: Mario Walker MRN: [id number] Date of Birth: [birth year]   Medicare Important Message Given:  Yes    Renie Ora 03/21/2018, 11:47 AM

## 2018-03-22 LAB — GLUCOSE, CAPILLARY
GLUCOSE-CAPILLARY: 89 mg/dL (ref 70–99)
GLUCOSE-CAPILLARY: 208 mg/dL — AB (ref 70–99)
GLUCOSE-CAPILLARY: 130 mg/dL — AB (ref 70–99)
Glucose-Capillary: 232 mg/dL — ABNORMAL HIGH (ref 70–99)

## 2018-03-22 LAB — BASIC METABOLIC PANEL
ANION GAP: 7 (ref 5–15)
BUN: 29 mg/dL — ABNORMAL HIGH (ref 8–23)
CHLORIDE: 99 mmol/L (ref 98–111)
CO2: 31 mmol/L (ref 22–32)
CREATININE: 1.42 mg/dL — AB (ref 0.61–1.24)
Calcium: 8.4 mg/dL — ABNORMAL LOW (ref 8.9–10.3)
GFR calc non Af Amer: 46 mL/min — ABNORMAL LOW (ref >60)
GFR, EST AFRICAN AMERICAN: 53 mL/min — AB (ref >60)
Glucose, Bld: 75 mg/dL (ref 70–99)
POTASSIUM: 3.7 mmol/L (ref 3.5–5.1)
SODIUM: 137 mmol/L (ref 135–145)

## 2018-03-22 LAB — CBC WITH DIFFERENTIAL/PLATELET
BASOS PCT: 0 %
Basophils Absolute: 0 10*3/uL (ref 0.0–0.1)
EOS ABS: 0.2 10*3/uL (ref 0.0–0.7)
Eosinophils Relative: 1 %
HEMATOCRIT: 32.8 % — AB (ref 39.0–52.0)
HEMOGLOBIN: 10.1 g/dL — AB (ref 13.0–17.0)
Lymphocytes Relative: 15 %
Lymphs Abs: 1.6 10*3/uL (ref 0.7–4.0)
MCH: 27.5 pg (ref 26.0–34.0)
MCHC: 30.8 g/dL (ref 30.0–36.0)
MCV: 89.4 fL (ref 78.0–100.0)
Monocytes Absolute: 1.1 10*3/uL — ABNORMAL HIGH (ref 0.1–1.0)
Monocytes Relative: 10 %
NEUTROS ABS: 7.9 10*3/uL — AB (ref 1.7–7.7)
NEUTROS PCT: 74 %
Platelets: 275 10*3/uL (ref 150–400)
RBC: 3.67 MIL/uL — AB (ref 4.22–5.81)
RDW: 15.2 % (ref 11.5–15.5)
WBC: 10.7 10*3/uL — AB (ref 4.0–10.5)

## 2018-03-22 LAB — MAGNESIUM: MAGNESIUM: 2 mg/dL (ref 1.7–2.4)

## 2018-03-22 LAB — BRAIN NATRIURETIC PEPTIDE: B Natriuretic Peptide: 782 pg/mL — ABNORMAL HIGH (ref 0.0–100.0)

## 2018-03-22 MED ORDER — DILTIAZEM HCL ER COATED BEADS 180 MG PO CP24
180.0000 mg | ORAL_CAPSULE | Freq: Every day | ORAL | Status: DC
  Administered 2018-03-22 – 2018-03-24 (×3): 180 mg via ORAL
  Filled 2018-03-22 (×3): qty 1

## 2018-03-22 MED ORDER — TORSEMIDE 20 MG PO TABS
20.0000 mg | ORAL_TABLET | Freq: Every day | ORAL | Status: DC
  Administered 2018-03-22: 20 mg via ORAL
  Filled 2018-03-22 (×2): qty 1

## 2018-03-22 NOTE — Progress Notes (Signed)
PROGRESS NOTE    Mario Walker  GNF:[id number] DOB: 1941/01/22 DOA: 03/15/2018 PCP: Craig Staggers, MD   Brief Narrative:   Mario Walker. Brickell is a 77 year old male with medical history significant for COPD chronic respiratory failure on home O2 at 2 L/min atrial fibrillation type 2 diabetes mellitus. He was admitted from Dr. Juanetta Gosling office for tachycardia and dyspnea. On arrival in the emergency department he was found to be in atrial fibrillation with rapid ventricular rate shortness of breath and intermittent chest pain and tightness.  He continues to have some ongoing elevated heart rate levels and has some volume overload noted on chest x-ray yesterday.  Cardiology following.  Assessment & Plan:   Principal Problem:   Atrial fibrillation with RVR (HCC) Active Problems:   CKD (chronic kidney disease) stage 3, GFR 30-59 ml/min (HCC)   Essential hypertension   Diabetes mellitus without complication (HCC)   Elevated troponin   Acute on chronic diastolic CHF (congestive heart failure) (HCC)   Chronic respiratory failure with hypoxia (HCC)   COPD (chronic obstructive pulmonary disease) (HCC)   Chronic diastolic CHF (congestive heart failure) (HCC)   Cor pulmonale (chronic) (HCC)   Atrial fibrillation with rapid ventricular response (HCC)   Acute on chronic respiratory failure with hypoxia (HCC)   1.Paroxysmal atrial fibrillation with rapid ventricular rate patient was on Cardizem at home he was started on Cardizem drip but that was discontinued and they placed him back on his home dose of Cardizem and metoprolol.  Appreciate cardiology recommendations with increasing Cardizem to 180 mg today due to persistent elevated heart rates at rest and with ambulation.  2. Acute on chronic respiratory failure patient was hypoxic on 6 L O2 while sleeping. Oxygenation is improved ,still on 6L/m O2 ,and no respiratory distress.  Discussed with Dr. Juanetta Gosling who feels concentrator will be needed  and will follow up with patient for pulmonology consultation in outpatient setting.  3. Acute on chronic congestive heart failure. Probably triggered by his atrial fibrillation with rapid ventricular rate. He had a 2D echo February 07, 2018 that showed EF of 55-60 with diastolic dysfunction and mild dilated right atrium moderate dilated right ventricle PA peak pressure was 65 mmHg.   Continue Cardizem and Coreg and patient resumed today on torsemide 20 mg per cardiology as renal function has improved overnight.  BNP to be evaluated as well.  4.Acute renal failure on chronic renal disease stage III-improved.  May be secondary to the hypotension due to the Cardizem drip will follow renal function with repeat BMP in a.m.  Renal ultrasound with no significant findings noted.  5. Hypertension continue home medication it is improved  6. diabetes mellitus continue current Lantus and meal coverage with sliding scale  7. Severe pulmonary hypertension cardiology consulted. Patient will follow with his pulmonologist. cardiology recommended for outpatient VQ scan and RIGHT HEART CARDIAC CATHERIZATION.  9. Mild hyponatremia- resolved.  This is likely secondary to use of diuretics as it had improved overnight without any further diuretics, but torsemide has been resumed today by cardiology.  Continue to follow with repeat labs in a.m.   DVT prophylaxis: Eliquis Code Status: Partial code Family Communication: None at bedside Disposition Plan:  Patient is clinically stable with improvement in shortness of breath.  However, he still remains volume overloaded with some tachycardia for which Cardizem has been increased by cardiology and torsemide has been added.  Will likely require another 1 to 2 days of medication adjustments and symptomatic improvement prior to  discharge.   Consultants:   Cardiology  Procedures:   None  Antimicrobials:   None   Subjective: Patient seen and  evaluated today with no new acute complaints or concerns. No acute concerns or events noted overnight.  He is currently on 6 L nasal cannula with no significant symptomatology noted this morning.  He is noted to have some ongoing tachycardia which was especially worsened with ambulation.  Objective: Vitals:   03/22/18 0400 03/22/18 0424 03/22/18 0500 03/22/18 0834  BP:  109/90    Pulse:      Resp:  (!) 21    Temp: 97.7 F (36.5 C)   98 F (36.7 C)  TempSrc: Oral   Oral  SpO2:  92%    Weight:   93.9 kg   Height:        Intake/Output Summary (Last 24 hours) at 03/22/2018 1143 Last data filed at 03/22/2018 0900 Gross per 24 hour  Intake 480 ml  Output 2950 ml  Net -2470 ml   Filed Weights   03/20/18 0400 03/21/18 0400 03/22/18 0500  Weight: 93.6 kg 93.8 kg 93.9 kg    Examination:  General exam: Appears calm and comfortable  Respiratory system: Clear to auscultation. Respiratory effort normal.  Currently on 6 L nasal cannula Cardiovascular system: S1 & S2 heard, irregular and tachycardic. No JVD, murmurs, rubs, gallops or clicks. No pedal edema. Gastrointestinal system: Abdomen is nondistended, soft and nontender. No organomegaly or masses felt. Normal bowel sounds heard. Central nervous system: Alert and oriented. No focal neurological deficits. Extremities: Symmetric 5 x 5 power. Skin: No rashes, lesions or ulcers Psychiatry: Judgement and insight appear normal. Mood & affect appropriate.     Data Reviewed: I have personally reviewed following labs and imaging studies  CBC: Recent Labs  Lab 03/18/18 0405 03/19/18 0431 03/20/18 0502 03/21/18 0354 03/22/18 0423  WBC 10.4 10.5 11.3* 10.9* 10.7*  NEUTROABS 7.2 7.7 8.5* 7.9* 7.9*  HGB 10.3* 10.2* 10.0* 9.7* 10.1*  HCT 33.7* 33.3* 33.1* 32.1* 32.8*  MCV 89.9 89.5 89.5 89.7 89.4  PLT 283 278 282 278 275   Basic Metabolic Panel: Recent Labs  Lab 03/18/18 0405 03/19/18 0431 03/20/18 0502 03/21/18 0354  03/22/18 0423  NA 133* 133* 131* 132* 137  K 4.6 4.1 4.3 5.7* 3.7  CL 94* 95* 95* 94* 99  CO2 29 27 27 29 31   GLUCOSE 126* 109* 128* 201* 75  BUN 44* 40* 45* 43* 29*  CREATININE 2.29* 1.86* 1.83* 2.09* 1.42*  CALCIUM 8.6* 8.7* 8.5* 8.9 8.4*  MG  --  1.9  --   --  2.0   GFR: Estimated Creatinine Clearance: 46 mL/min (A) (by C-G formula based on SCr of 1.42 mg/dL (H)). Liver Function Tests: Recent Labs  Lab 03/19/2018 1758  AST 20  ALT 15  ALKPHOS 71  BILITOT 1.0  PROT 7.8  ALBUMIN 3.6   No results for input(s): LIPASE, AMYLASE in the last 168 hours. No results for input(s): AMMONIA in the last 168 hours. Coagulation Profile: No results for input(s): INR, PROTIME in the last 168 hours. Cardiac Enzymes: Recent Labs  Lab 03/05/2018 1758 03/17/18 0005 03/17/18 0310  TROPONINI 0.44* 0.60* 0.63*   BNP (last 3 results) No results for input(s): PROBNP in the last 8760 hours. HbA1C: No results for input(s): HGBA1C in the last 72 hours. CBG: Recent Labs  Lab 03/21/18 1126 03/21/18 1636 03/21/18 2140 03/22/18 0754 03/22/18 1114  GLUCAP 223* 177* 167* 89 208*  Lipid Profile: No results for input(s): CHOL, HDL, LDLCALC, TRIG, CHOLHDL, LDLDIRECT in the last 72 hours. Thyroid Function Tests: No results for input(s): TSH, T4TOTAL, FREET4, T3FREE, THYROIDAB in the last 72 hours. Anemia Panel: No results for input(s): VITAMINB12, FOLATE, FERRITIN, TIBC, IRON, RETICCTPCT in the last 72 hours. Sepsis Labs: No results for input(s): PROCALCITON, LATICACIDVEN in the last 168 hours.  Recent Results (from the past 240 hour(s))  Culture, Urine     Status: None   Collection Time: 03/18/18  9:01 AM  Result Value Ref Range Status   Specimen Description   Final    URINE, RANDOM Performed at [facility] [address] [place], [place] [address]    Special Requests   Final    NONE Performed at [facility], [address], [place], [place] [address]    Culture   Final     Multiple bacterial morphotypes present, none predominant. Suggest appropriate recollection if clinically indicated.   Report Status 03/19/2018 FINAL  Final         Radiology Studies: Dg Chest Port 1 View  Result Date: 03/21/2018 CLINICAL DATA:  COPD, CHF EXAM: PORTABLE CHEST 1 VIEW COMPARISON:  03/18/2018 FINDINGS: Cardiomegaly with vascular congestion. Worsening bilateral airspace opacities, likely worsening edema. Suspect small layering effusions. No acute bony abnormality. IMPRESSION: Worsening bilateral airspace disease, likely worsening CHF. Small effusions. Electronically Signed   By: Charlett Nose M.D.   On: 03/21/2018 07:23        Scheduled Meds: . apixaban  5 mg Oral BID  . atorvastatin  40 mg Oral QPM  . carvedilol  25 mg Oral BID  . diltiazem  180 mg Oral Daily  . DULoxetine  60 mg Oral Daily  . insulin aspart  0-15 Units Subcutaneous TID WC  . insulin aspart  0-5 Units Subcutaneous QHS  . insulin aspart  5 Units Subcutaneous TID AC  . insulin glargine  35 Units Subcutaneous QHS  . levalbuterol  0.63 mg Nebulization Q6H WA  . multivitamin with minerals  1 tablet Oral Daily  . pantoprazole  80 mg Oral Daily  . torsemide  20 mg Oral Daily  . vitamin C  500 mg Oral Daily   Continuous Infusions:   LOS: 4 days    Time spent: 30 minutes    Gillian Kluever Hoover Brunette, DO [facility] Pager [phone]  If 7PM-7AM, please contact night-coverage [email or web address] Password [email or web address] 03/22/2018, 11:43 AM

## 2018-03-22 NOTE — Care Management Note (Signed)
Case Management Note  Patient Details  Name: Mario Walker MRN: [id number] Date of Birth: [birth year]  If discussed at Long Length of Stay Meetings, dates discussed:  03/22/2018  Additional Comments:  Vinod Mikesell, Chrystine Oiler, RN 03/22/2018, 12:09 PM

## 2018-03-22 NOTE — Progress Notes (Signed)
Progress Note  Patient Name: Mario Walker Date of Encounter: 03/22/2018  Primary Cardiologist: Nona Dell, MD   Subjective   Some improvement in SOB  Inpatient Medications    Scheduled Meds: . apixaban  5 mg Oral BID  . atorvastatin  40 mg Oral QPM  . carvedilol  25 mg Oral BID  . diltiazem  120 mg Oral Daily  . DULoxetine  60 mg Oral Daily  . insulin aspart  0-15 Units Subcutaneous TID WC  . insulin aspart  0-5 Units Subcutaneous QHS  . insulin aspart  5 Units Subcutaneous TID AC  . insulin glargine  35 Units Subcutaneous QHS  . levalbuterol  0.63 mg Nebulization Q6H WA  . multivitamin with minerals  1 tablet Oral Daily  . pantoprazole  80 mg Oral Daily  . vitamin C  500 mg Oral Daily   Continuous Infusions:  PRN Meds: acetaminophen, albuterol, fluticasone, HYDROcodone-acetaminophen, ondansetron (ZOFRAN) IV   Vital Signs    Vitals:   03/22/18 0400 03/22/18 0424 03/22/18 0500 03/22/18 0834  BP:  109/90    Pulse:      Resp:  (!) 21    Temp: 97.7 F (36.5 C)   98 F (36.7 C)  TempSrc: Oral   Oral  SpO2:  92%    Weight:   93.9 kg   Height:        Intake/Output Summary (Last 24 hours) at 03/22/2018 0836 Last data filed at 03/22/2018 0425 Gross per 24 hour  Intake 480 ml  Output 1650 ml  Net -1170 ml   Filed Weights   03/20/18 0400 03/21/18 0400 03/22/18 0500  Weight: 93.6 kg 93.8 kg 93.9 kg    Telemetry    Aflutter 120s - Personally Reviewed  ECG    na  Physical Exam   GEN: No acute distress.   Neck: No JVD Cardiac: RRR, no murmurs, rubs, or gallops.  Respiratory: Clear to auscultation bilaterally. GI: Soft, nontender, non-distended  MS: 1+ bilateral LE edema; No deformity. Neuro:  Nonfocal  Psych: Normal affect   Labs    Chemistry Recent Labs  Lab 02/22/2018 1758  03/20/18 0502 03/21/18 0354 03/22/18 0423  NA 132*   < > 131* 132* 137  K 3.6   < > 4.3 5.7* 3.7  CL 92*   < > 95* 94* 99  CO2 28   < > 27 29 31   GLUCOSE 322*    < > 128* 201* 75  BUN 25*   < > 45* 43* 29*  CREATININE 1.43*   < > 1.83* 2.09* 1.42*  CALCIUM 8.9   < > 8.5* 8.9 8.4*  PROT 7.8  --   --   --   --   ALBUMIN 3.6  --   --   --   --   AST 20  --   --   --   --   ALT 15  --   --   --   --   ALKPHOS 71  --   --   --   --   BILITOT 1.0  --   --   --   --   GFRNONAA 45*   < > 34* 29* 46*  GFRAA 53*   < > 39* 33* 53*  ANIONGAP 12   < > 9 9 7    < > = values in this interval not displayed.     Hematology Recent Labs  Lab 03/20/18 0502 03/21/18 0354 03/22/18  0423  WBC 11.3* 10.9* 10.7*  RBC 3.70* 3.58* 3.67*  HGB 10.0* 9.7* 10.1*  HCT 33.1* 32.1* 32.8*  MCV 89.5 89.7 89.4  MCH 27.0 27.1 27.5  MCHC 30.2 30.2 30.8  RDW 15.2 15.2 15.2  PLT 282 278 275    Cardiac Enzymes Recent Labs  Lab 2018/03/31 1758 03/17/18 0005 03/17/18 0310  TROPONINI 0.44* 0.60* 0.63*   No results for input(s): TROPIPOC in the last 168 hours.   BNP Recent Labs  Lab 31-Mar-2018 1745  BNP 583.0*     DDimer No results for input(s): DDIMER in the last 168 hours.   Radiology    US Renal  Result Date: 03/20/2018 CLINICAL DATA:  Acute renal injury EXAM: RENAL / URINARY TRACT ULTRASOUND COMPLETE COMPARISON:  CT 02/06/2018 FINDINGS: Right Kidney: Length: 10.7 cm. Echogenicity within normal limits. No mass or hydronephrosis visualized. Left Kidney: Length: 10.4 cm. Echogenicity within normal limits. No mass or hydronephrosis visualized. Mild renal cortical thinning. Bladder: Appears normal for degree of bladder distention. IMPRESSION: 1. No hydronephrosis. 2. Renal cortical thinning more prominent on the LEFT. Electronically Signed   By: Genevive Bi M.D.   On: 03/20/2018 14:26   Dg Chest Port 1 View  Result Date: 03/21/2018 CLINICAL DATA:  COPD, CHF EXAM: PORTABLE CHEST 1 VIEW COMPARISON:  03/18/2018 FINDINGS: Cardiomegaly with vascular congestion. Worsening bilateral airspace opacities, likely worsening edema. Suspect small layering effusions. No acute  bony abnormality. IMPRESSION: Worsening bilateral airspace disease, likely worsening CHF. Small effusions. Electronically Signed   By: Charlett Nose M.D.   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 Janee Morn.  Assessment & Plan    1. Afib/aflutter with RVR - rate contorl with coreg and diltiazem, on eliquis for stroke prevention - may consider attempt at DCCV once on anticoag 3 weeks and pending respiratory status - due to his significant lung disease and hypoxia he is going to have some degree of exertional tachcyardia, however fairly significant to 140s yesterdya. This AM at rest rates 120s.   - increase dilt to 180mg  daily.    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  - home O2 4-6 L chronically  3. Pulmonary HTN with right heart failure - 01/2018 echo LVE 55-60%, no WMAs, abnormal diastolic function. Mild to mod RV dilatation, PASP 65, mod MR. Septum shows evidence of RV pressure overload.  - likely primarily group 3 pulm HTN given his lung disease, perhaps some component of group 2 given some evidence of diastolic HF. No strong indication for pulmonary vasodilators at this time.  - can consider outpatient VQ, perhaps RHC pending f/u with primary cardiologist.     4. AKI on CKD - worsening renal function, signficant improvement today off lasix.   5. Elevated troponin - up to 0.63 in setting of tachycardia and severe hypoxia. Certaintly factors that could lead to demand ishcemia - echo with normal LVEF, no WMAs - with poor renal function  not candidate for cath at this time. Difficult candidate for noninvasive testing as well. Caution with regadnoson given his lung disease, would avoid dobutamine due to  his difficult to control afib, renal function prohibits contrast for coronary CTA. Could consider RHC/LHC as outpatient once renal function has gone back to baseline. No plans for inpatient ischemic testing at this time.    6. Acute diastolic HF - 01/2018 echo LVE 16-10%, no WMAs, abnormal diasotlic function indeterminate grade, evidence of RV pressure overload. Mild to mod RV dilatation, PASP 65.  -lasix stopped yesterday due to uptrend in Cr/BUN. Labs improved this AM. I/Os are incomplete this admission.  - CXR yesterday reports worsening airspace disease, likely CHF. We will check a BNP, Cr/BUN pattern would actually suggest he has been diuresed, Cr was up all the way to 2 yesterday. Does have some ongoing LE edema.  - will start torsemide 20mg  daily and follow I/Os and renal fucntion   Would continue to monitor fluid/renal status as well as heart rates with med changes as reported above in hospital    For questions or updates, please contact [facility] Please consult [email or web address] for contact info under        Signed, Dina Rich, MD  03/22/2018, 8:36 AM

## 2018-03-22 DEATH — deceased

## 2018-03-23 DIAGNOSIS — R7989 Other specified abnormal findings of blood chemistry: Secondary | ICD-10-CM

## 2018-03-23 LAB — CBC WITH DIFFERENTIAL/PLATELET
Basophils Absolute: 0 10*3/uL (ref 0.0–0.1)
Basophils Relative: 0 %
EOS ABS: 0.1 10*3/uL (ref 0.0–0.7)
EOS PCT: 1 %
HCT: 31.9 % — ABNORMAL LOW (ref 39.0–52.0)
Hemoglobin: 9.5 g/dL — ABNORMAL LOW (ref 13.0–17.0)
LYMPHS ABS: 1.9 10*3/uL (ref 0.7–4.0)
Lymphocytes Relative: 20 %
MCH: 27.1 pg (ref 26.0–34.0)
MCHC: 29.8 g/dL — AB (ref 30.0–36.0)
MCV: 91.1 fL (ref 78.0–100.0)
MONO ABS: 1.2 10*3/uL — AB (ref 0.1–1.0)
Monocytes Relative: 12 %
Neutro Abs: 6.6 10*3/uL (ref 1.7–7.7)
Neutrophils Relative %: 67 %
PLATELETS: 244 10*3/uL (ref 150–400)
RBC: 3.5 MIL/uL — AB (ref 4.22–5.81)
RDW: 15.4 % (ref 11.5–15.5)
WBC: 9.8 10*3/uL (ref 4.0–10.5)

## 2018-03-23 LAB — BASIC METABOLIC PANEL
Anion gap: 4 — ABNORMAL LOW (ref 5–15)
BUN: 30 mg/dL — AB (ref 8–23)
CALCIUM: 8.6 mg/dL — AB (ref 8.9–10.3)
CO2: 37 mmol/L — ABNORMAL HIGH (ref 22–32)
CREATININE: 1.58 mg/dL — AB (ref 0.61–1.24)
Chloride: 96 mmol/L — ABNORMAL LOW (ref 98–111)
GFR calc Af Amer: 47 mL/min — ABNORMAL LOW (ref >60)
GFR, EST NON AFRICAN AMERICAN: 40 mL/min — AB (ref >60)
GLUCOSE: 105 mg/dL — AB (ref 70–99)
Potassium: 4 mmol/L (ref 3.5–5.1)
Sodium: 137 mmol/L (ref 135–145)

## 2018-03-23 LAB — GLUCOSE, CAPILLARY
GLUCOSE-CAPILLARY: 126 mg/dL — AB (ref 70–99)
Glucose-Capillary: 67 mg/dL — ABNORMAL LOW (ref 70–99)
Glucose-Capillary: 149 mg/dL — ABNORMAL HIGH (ref 70–99)
Glucose-Capillary: 233 mg/dL — ABNORMAL HIGH (ref 70–99)
Glucose-Capillary: 57 mg/dL — ABNORMAL LOW (ref 70–99)

## 2018-03-23 MED ORDER — ZOLPIDEM TARTRATE 5 MG PO TABS
5.0000 mg | ORAL_TABLET | Freq: Every evening | ORAL | Status: DC | PRN
  Administered 2018-03-23 – 2018-03-25 (×2): 5 mg via ORAL
  Filled 2018-03-23 (×2): qty 1

## 2018-03-23 MED ORDER — TORSEMIDE 20 MG PO TABS
20.0000 mg | ORAL_TABLET | Freq: Two times a day (BID) | ORAL | Status: DC
  Administered 2018-03-23 – 2018-03-24 (×3): 20 mg via ORAL
  Filled 2018-03-23 (×3): qty 1

## 2018-03-23 NOTE — Progress Notes (Signed)
Hypoglycemic episode. CBG was 57. Protocol followed and CBG came back up to 114

## 2018-03-23 NOTE — Progress Notes (Signed)
Progress Note  Patient Name: Mario Walker Date of Encounter: 03/23/2018  Primary Cardiologist: Nona Dell, MD   Subjective   Energy improved, breathing improving.   Inpatient Medications    Scheduled Meds: . apixaban  5 mg Oral BID  . atorvastatin  40 mg Oral QPM  . carvedilol  25 mg Oral BID  . diltiazem  180 mg Oral Daily  . DULoxetine  60 mg Oral Daily  . insulin aspart  0-15 Units Subcutaneous TID WC  . insulin aspart  0-5 Units Subcutaneous QHS  . insulin aspart  5 Units Subcutaneous TID AC  . insulin glargine  35 Units Subcutaneous QHS  . levalbuterol  0.63 mg Nebulization Q6H WA  . multivitamin with minerals  1 tablet Oral Daily  . pantoprazole  80 mg Oral Daily  . torsemide  20 mg Oral Daily  . vitamin C  500 mg Oral Daily   Continuous Infusions:  PRN Meds: acetaminophen, albuterol, fluticasone, HYDROcodone-acetaminophen, ondansetron (ZOFRAN) IV, zolpidem   Vital Signs    Vitals:   03/23/18 0400 03/23/18 0500 03/23/18 0741 03/23/18 0813  BP: 108/68     Pulse: 74  86   Resp: 19  (!) 21   Temp: (!) 97.5 F (36.4 C)  (!) 97.3 F (36.3 C)   TempSrc: Oral  Oral   SpO2: 98%  90% 98%  Weight:  94.7 kg    Height:        Intake/Output Summary (Last 24 hours) at 03/23/2018 0820 Last data filed at 03/22/2018 2248 Gross per 24 hour  Intake 290 ml  Output 1450 ml  Net -1160 ml   Filed Weights   03/21/18 0400 03/22/18 0500 03/23/18 0500  Weight: 93.8 kg 93.9 kg 94.7 kg    Telemetry    Aflutter controlled rates - Personally Reviewed  ECG    na  Physical Exam   GEN: No acute distress.   Neck: No JVD Cardiac: irreg, tachy Respiratory: mild bilateral crackles. GI: Soft, nontender, non-distended  MS: 1+ bilateral edema; No deformity. Neuro:  Nonfocal  Psych: Normal affect   Labs    Chemistry Recent Labs  Lab 02/23/2018 1758  03/21/18 0354 03/22/18 0423 03/23/18 0351  NA 132*   < > 132* 137 137  K 3.6   < > 5.7* 3.7 4.0  CL 92*   <  > 94* 99 96*  CO2 28   < > 29 31 37*  GLUCOSE 322*   < > 201* 75 105*  BUN 25*   < > 43* 29* 30*  CREATININE 1.43*   < > 2.09* 1.42* 1.58*  CALCIUM 8.9   < > 8.9 8.4* 8.6*  PROT 7.8  --   --   --   --   ALBUMIN 3.6  --   --   --   --   AST 20  --   --   --   --   ALT 15  --   --   --   --   ALKPHOS 71  --   --   --   --   BILITOT 1.0  --   --   --   --   GFRNONAA 45*   < > 29* 46* 40*  GFRAA 53*   < > 33* 53* 47*  ANIONGAP 12   < > 9 7 4*   < > = values in this interval not displayed.     Hematology Recent Labs  Lab 03/21/18 0354 03/22/18 0423 03/23/18 0351  WBC 10.9* 10.7* 9.8  RBC 3.58* 3.67* 3.50*  HGB 9.7* 10.1* 9.5*  HCT 32.1* 32.8* 31.9*  MCV 89.7 89.4 91.1  MCH 27.1 27.5 27.1  MCHC 30.2 30.8 29.8*  RDW 15.2 15.2 15.4  PLT 278 275 244    Cardiac Enzymes Recent Labs  Lab 2018/03/24 1758 03/17/18 0005 03/17/18 0310  TROPONINI 0.44* 0.60* 0.63*   No results for input(s): TROPIPOC in the last 168 hours.   BNP Recent Labs  Lab Mar 24, 2018 1745 03/22/18 0423  BNP 583.0* 782.0*     DDimer No results for input(s): DDIMER in the last 168 hours.   Radiology    No results found.  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 Janee Morn.  Assessment & Plan    1. Afib/aflutter with RVR - rate contorl with coreg and diltiazem, on eliquis for stroke prevention - may consider attempt at DCCV once on anticoag 3 weeks and pending respiratory status  - elevated heart rates yesterday, at rest and worst with exertion. Dilt was increased to 180mg  daily.     2. Intersitial  lung disease - per pulmonary and primary team. - home O2 4-6 L chronically  3. Pulmonary HTN with right heart failure - 01/2018 echo LVE 55-60%, no WMAs, abnormal diastolic function. Mild to mod RV dilatation, PASP 65, mod MR. Septum shows evidence of RV pressure overload.  - likely primarily group 3 pulm HTN given his lung disease, perhaps some component of group 2 given some evidence of diastolic HF.No strong indication for pulmonary vasodilators at this time. - can consider outpatient VQ, perhaps RHC pending f/u with primary cardiologist.     4. AKI on CKD - worsening renal function, signficant improvement today off IV lasix.   5. Elevated troponin - up to 0.63 in setting of tachycardia and severe hypoxia. Certaintly factors that could lead to demand ishcemia - echo with normal LVEF, no WMAs - with poor renal function not candidate for cath at this time. Difficult candidate for noninvasive testing as well. Caution with regadnoson given his lung disease, would avoid dobutamine due to his difficult to control afib, renal function prohibits contrast for coronary CTA.Could consider RHC/LHC as outpatient once renal function has gone back to baseline.No plans for inpatient ischemic testing at this time.   6. Acute diastolic HF - 01/2018 echo LVE 16-10%, no WMAs, abnormal diasotlic function indeterminate grade, evidence of RV pressure overload. Mild to mod RV dilatation, PASP 65.  -lasix stopped yesterday due to uptrend in Cr/BUN. Labs improved this AM. I/Os are incomplete this admission.  - CXR yesterday reports worsening airspace disease, likely CHF. We rechecked a BNP which incrased from 580 to 780.  - started torsemide 20mg  daily yesterday, mild uptrend in Cr and BUN. Apppears will have to accept higher Cr in order to diurese. From documented I/Os negative 1.1 L though daytime input was not documented. I suspect he was just mildly negative. Continue oral diuretics today but will  try bid dosing of toresmide, significant AKI on IV lasix.      Would continue to monitor fluid/renal status  as well as heart rates with med changes as reported above in hospital For questions or updates, please contact [facility] Please consult [email or web address] for contact info under        Signed, Dina Rich, MD  03/23/2018, 8:20 AM

## 2018-03-23 NOTE — Progress Notes (Signed)
Pt c/o insomniaDonnamarie Poag, NP paged to see if a sleeping medication could be ordered. Waiting for call back/orders.

## 2018-03-23 NOTE — Progress Notes (Signed)
PROGRESS NOTE    Mario Walker  ZOX:[id number] DOB: [birth year] DOA: 2018-04-14 PCP: Mario Staggers, MD   Brief Narrative:  HPI on Apr 14, 2018 by Dr. Leavy Cella Walker Mario Walker is a 77 y.o. male with medical history significant of *COPD, chronic respiratory failure, A. fib, type 2 diabetes, presents with tachycardia.  He went to Dr. Juanetta Walker office today for follow-up visit.  He was found to be tachycardic.  He was sent over to the ED.  He was found to be in RVR. States he also has some dyspnea on exertion when his palpitations occur.  Patient denies any chest pain.  Was admitted on September 13 for CHF and new onset A. fib.  He was discharged on p.o. Cardizem and Eliquis.  He he states compliance with his medications.    Interim history Patient admitted for paroxysmal atrial fibrillation with RVR and was placed on Cardizem drip.  He then was found to have acute on chronic respiratory failure which has been improving.  So has acute on chronic heart failure, cardiology consulted and appreciated.  Assessment & Plan   Paroxysmal atrial fibrillation with rapid ventricular rate -Patient required Cardizem drip upon admission however has now been transitioned to oral Cardizem as well as metoprolol -Cardiology consulted and appreciated, dose was increased to 180 mg daily -Continue Eliquis -Cardiology may attempt or consider DCCV in 3 weeks once patient has been anticoagulated and his respiratory status is improved  Acute on chronic diastolic congestive heart failure -Possibly triggered by atrial fibrillation with RVR -Echocardiogram February 07, 2018 showed an EF of 55 to 60%, no regional wall motion abnormalities.  Findings consistent with LV diastolic dysfunction, grade intermediate. -Cardiology consulted and appreciated -Chest x-ray showed worsening airspace disease likely CHF, BNP was also noted to be increased -Patient started on torsemide -Continue to monitor intake and output, daily  weights  Acute on chronic respiratory failure with hypoxia -Patient uses 5 to 6 L of home oxygen at home -Currently he has been weaned to his home amount and is in no respiratory distress -Reviewed hospitalist discussed with Dr. Juanetta Walker, pulmonology, who feels concentrator will be needed and will follow up with patient in pulmonology outpatient setting  Acute renal failure on chronic kidney disease, stage III -Renal ultrasound shows no significant findings -Suspect secondary to hypotension due to Cardizem -Creatinine 1.58 today -Continue to monitor BMP  Essential hypertension -Continue coreg, diltiazem, demadex  Diabetes mellitus, type II -Continue Lantus, insulin sliding scale and CBG monitoring  Severe pulmonary hypertension -Patient to follow-up with pulmonology as an outpatient -Cardiology also recommending outpatient VQ scan as well as a right heart cardiac catheterization  Mild hyponatremia -resolved, suspect secondary to diuretics -Continue monitor BMP  Elevated troponin -Troponin peaked to 0.63 -Suspect secondary to tachycardia as well as hypoxia leading to demand ischemia -As above, cardiology has been following.  Currently he is not a candidate for invasive testing.  Physical deconditioning  -PT recommend HH -Patient refused HH services   DVT Prophylaxis  Eliquis  Code Status: Partial Code, DNI  Family Communication: None at bedside  Disposition Plan: Admitted. Pending further recommendations from cardiology.  Consultants Cardiology  Procedures  None  Antibiotics   Anti-infectives (From admission, onward)   None      Subjective:   Mario Walker seen and examined today.  Patient states he is feeling better today. Continues to have shortness of breath, but feels it has improved. States cardiology thinks he needs to have more fluid taken off. Currently  denies chest pain, abdominal pain, N/V/D/C.   Objective:   Vitals:   03/23/18 0813 03/23/18 1141  03/23/18 1200 03/23/18 1316  BP:   115/64   Pulse:  88 88   Resp:      Temp:  97.8 F (36.6 C)    TempSrc:  Oral    SpO2: 98% 95% 93% 94%  Weight:      Height:        Intake/Output Summary (Last 24 hours) at 03/23/2018 1606 Last data filed at 03/23/2018 1300 Gross per 24 hour  Intake 290 ml  Output 1600 ml  Net -1310 ml   Filed Weights   03/21/18 0400 03/22/18 0500 03/23/18 0500  Weight: 93.8 kg 93.9 kg 94.7 kg    Exam  General: Well developed, well nourished, NAD, appears stated age  HEENT: NCAT, mucous membranes moist.   Neck: Supple  Cardiovascular: S1 S2 auscultated, irregular  Respiratory: Diminished breath sounds  Abdomen: Soft, nontender, nondistended, + bowel sounds  Extremities: warm dry without cyanosis clubbing.+ LE edema B/L   Neuro: AAOx3, nonfocal  Psych: Normal affect and demeanor with intact judgement and insight   Data Reviewed: I have personally reviewed following labs and imaging studies  CBC: Recent Labs  Lab 03/19/18 0431 03/20/18 0502 03/21/18 0354 03/22/18 0423 03/23/18 0351  WBC 10.5 11.3* 10.9* 10.7* 9.8  NEUTROABS 7.7 8.5* 7.9* 7.9* 6.6  HGB 10.2* 10.0* 9.7* 10.1* 9.5*  HCT 33.3* 33.1* 32.1* 32.8* 31.9*  MCV 89.5 89.5 89.7 89.4 91.1  PLT 278 282 278 275 244   Basic Metabolic Panel: Recent Labs  Lab 03/19/18 0431 03/20/18 0502 03/21/18 0354 03/22/18 0423 03/23/18 0351  NA 133* 131* 132* 137 137  K 4.1 4.3 5.7* 3.7 4.0  CL 95* 95* 94* 99 96*  CO2 27 27 29 31  37*  GLUCOSE 109* 128* 201* 75 105*  BUN 40* 45* 43* 29* 30*  CREATININE 1.86* 1.83* 2.09* 1.42* 1.58*  CALCIUM 8.7* 8.5* 8.9 8.4* 8.6*  MG 1.9  --   --  2.0  --    GFR: Estimated Creatinine Clearance: 41.5 mL/min (A) (by C-G formula based on SCr of 1.58 mg/dL (H)). Liver Function Tests: Recent Labs  Lab 31-Mar-2018 1758  AST 20  ALT 15  ALKPHOS 71  BILITOT 1.0  PROT 7.8  ALBUMIN 3.6   No results for input(s): LIPASE, AMYLASE in the last 168 hours. No  results for input(s): AMMONIA in the last 168 hours. Coagulation Profile: No results for input(s): INR, PROTIME in the last 168 hours. Cardiac Enzymes: Recent Labs  Lab 03-31-2018 1758 03/17/18 0005 03/17/18 0310  TROPONINI 0.44* 0.60* 0.63*   BNP (last 3 results) No results for input(s): PROBNP in the last 8760 hours. HbA1C: No results for input(s): HGBA1C in the last 72 hours. CBG: Recent Labs  Lab 03/22/18 1114 03/22/18 1750 03/22/18 2118 03/23/18 0732 03/23/18 1142  GLUCAP 208* 130* 232* 67* 149*   Lipid Profile: No results for input(s): CHOL, HDL, LDLCALC, TRIG, CHOLHDL, LDLDIRECT in the last 72 hours. Thyroid Function Tests: No results for input(s): TSH, T4TOTAL, FREET4, T3FREE, THYROIDAB in the last 72 hours. Anemia Panel: No results for input(s): VITAMINB12, FOLATE, FERRITIN, TIBC, IRON, RETICCTPCT in the last 72 hours. Urine analysis:    Component Value Date/Time   COLORURINE YELLOW 03/18/2018 0901   APPEARANCEUR CLEAR 03/18/2018 0901   LABSPEC 1.016 03/18/2018 0901   PHURINE 5.0 03/18/2018 0901   GLUCOSEU 50 (A) 03/18/2018 0901   HGBUR NEGATIVE  03/18/2018 0901   BILIRUBINUR NEGATIVE 03/18/2018 0901   KETONESUR NEGATIVE 03/18/2018 0901   PROTEINUR NEGATIVE 03/18/2018 0901   NITRITE NEGATIVE 03/18/2018 0901   LEUKOCYTESUR TRACE (A) 03/18/2018 0901   Sepsis Labs: @LABRCNTIP (procalcitonin:4,lacticidven:4)  ) Recent Results (from the past 240 hour(s))  Culture, Urine     Status: None   Collection Time: 03/18/18  9:01 AM  Result Value Ref Range Status   Specimen Description   Final    URINE, RANDOM Performed at [facility], [address] [place], [place] [address]    Special Requests   Final    NONE Performed at [facility], [address] [place], [place] [address]    Culture   Final    Multiple bacterial morphotypes present, none predominant. Suggest appropriate recollection if clinically indicated.   Report Status 03/19/2018 FINAL  Final       Radiology Studies: No results found.   Scheduled Meds: . apixaban  5 mg Oral BID  . atorvastatin  40 mg Oral QPM  . carvedilol  25 mg Oral BID  . diltiazem  180 mg Oral Daily  . DULoxetine  60 mg Oral Daily  . insulin aspart  0-15 Units Subcutaneous TID WC  . insulin aspart  0-5 Units Subcutaneous QHS  . insulin aspart  5 Units Subcutaneous TID AC  . insulin glargine  35 Units Subcutaneous QHS  . levalbuterol  0.63 mg Nebulization Q6H WA  . multivitamin with minerals  1 tablet Oral Daily  . pantoprazole  80 mg Oral Daily  . torsemide  20 mg Oral BID  . vitamin C  500 mg Oral Daily   Continuous Infusions:   LOS: 5 days   Time Spent in minutes   30 minutes  Fronia Depass D.O. on 03/23/2018 at 4:06 PM  Between 7am to 7pm - Please see pager noted on [email or web address]  After 7pm go to [email or web address]  And look for the night coverage person covering for me after hours  [facility] Office  [phone]

## 2018-03-23 NOTE — Progress Notes (Signed)
Pt just got back in bed from chair and a little winded, treatment given, spo2 increased back up to 99%

## 2018-03-24 LAB — GLUCOSE, CAPILLARY
GLUCOSE-CAPILLARY: 60 mg/dL — AB (ref 70–99)
GLUCOSE-CAPILLARY: 74 mg/dL (ref 70–99)
GLUCOSE-CAPILLARY: 162 mg/dL — AB (ref 70–99)
GLUCOSE-CAPILLARY: 183 mg/dL — AB (ref 70–99)
Glucose-Capillary: 114 mg/dL — ABNORMAL HIGH (ref 70–99)
Glucose-Capillary: 166 mg/dL — ABNORMAL HIGH (ref 70–99)
Glucose-Capillary: 141 mg/dL — ABNORMAL HIGH (ref 70–99)
Glucose-Capillary: 135 mg/dL — ABNORMAL HIGH (ref 70–99)
Glucose-Capillary: 147 mg/dL — ABNORMAL HIGH (ref 70–99)
Glucose-Capillary: 205 mg/dL — ABNORMAL HIGH (ref 70–99)

## 2018-03-24 LAB — CBC WITH DIFFERENTIAL/PLATELET
Basophils Absolute: 0 10*3/uL (ref 0.0–0.1)
Basophils Relative: 0 %
Eosinophils Absolute: 0.1 10*3/uL (ref 0.0–0.7)
Eosinophils Relative: 1 %
HEMATOCRIT: 33.5 % — AB (ref 39.0–52.0)
Hemoglobin: 10 g/dL — ABNORMAL LOW (ref 13.0–17.0)
LYMPHS PCT: 9 %
Lymphs Abs: 1 10*3/uL (ref 0.7–4.0)
MCH: 27.2 pg (ref 26.0–34.0)
MCHC: 29.9 g/dL — AB (ref 30.0–36.0)
MCV: 91.3 fL (ref 78.0–100.0)
MONOS PCT: 6 %
Monocytes Absolute: 0.7 10*3/uL (ref 0.1–1.0)
NEUTROS ABS: 9.4 10*3/uL — AB (ref 1.7–7.7)
Neutrophils Relative %: 84 %
PLATELETS: 261 10*3/uL (ref 150–400)
RBC: 3.67 MIL/uL — ABNORMAL LOW (ref 4.22–5.81)
RDW: 15.5 % (ref 11.5–15.5)
WBC: 11.2 10*3/uL — ABNORMAL HIGH (ref 4.0–10.5)

## 2018-03-24 LAB — BASIC METABOLIC PANEL
Anion gap: 9 (ref 5–15)
BUN: 28 mg/dL — ABNORMAL HIGH (ref 8–23)
CALCIUM: 8.7 mg/dL — AB (ref 8.9–10.3)
CO2: 33 mmol/L — AB (ref 22–32)
CREATININE: 1.38 mg/dL — AB (ref 0.61–1.24)
Chloride: 95 mmol/L — ABNORMAL LOW (ref 98–111)
GFR calc Af Amer: 55 mL/min — ABNORMAL LOW (ref >60)
GFR, EST NON AFRICAN AMERICAN: 47 mL/min — AB (ref >60)
GLUCOSE: 196 mg/dL — AB (ref 70–99)
Potassium: 4.7 mmol/L (ref 3.5–5.1)
Sodium: 137 mmol/L (ref 135–145)

## 2018-03-24 LAB — TROPONIN I: Troponin I: 0.21 ng/mL (ref <0.03)

## 2018-03-24 MED ORDER — DEXTROSE 50 % IV SOLN
INTRAVENOUS | Status: AC
  Administered 2018-03-24: 50 mL
  Filled 2018-03-24: qty 50

## 2018-03-24 MED ORDER — INSULIN GLARGINE 100 UNIT/ML ~~LOC~~ SOLN
18.0000 [IU] | Freq: Every day | SUBCUTANEOUS | Status: DC
  Administered 2018-03-24: 18 [IU] via SUBCUTANEOUS
  Filled 2018-03-24 (×4): qty 0.18

## 2018-03-24 MED ORDER — LEVALBUTEROL HCL 0.63 MG/3ML IN NEBU
0.6300 mg | INHALATION_SOLUTION | Freq: Three times a day (TID) | RESPIRATORY_TRACT | Status: DC
  Filled 2018-03-24: qty 3

## 2018-03-24 MED ORDER — NITROGLYCERIN 0.4 MG SL SUBL: 0.4000 mg | SUBLINGUAL_TABLET | SUBLINGUAL | Status: DC | PRN

## 2018-03-24 MED ORDER — MORPHINE SULFATE (PF) 2 MG/ML IV SOLN
2.0000 mg | Freq: Once | INTRAVENOUS | Status: AC
  Administered 2018-03-24: 2 mg via INTRAVENOUS
  Filled 2018-03-24: qty 1

## 2018-03-24 MED ORDER — TORSEMIDE 20 MG PO TABS
40.0000 mg | ORAL_TABLET | Freq: Two times a day (BID) | ORAL | Status: DC
  Administered 2018-03-24: 40 mg via ORAL
  Filled 2018-03-24: qty 2

## 2018-03-24 NOTE — Progress Notes (Signed)
Patient's troponin resulted at 0.21, down from 0.63. Mid level, aware. Will continue to monitor.

## 2018-03-24 NOTE — Progress Notes (Signed)
Patient reporting chest pain rated at a 6. Reported as pressure in mid chest to left side. Reports shortness of breath, patient denies diaphoresis and N/V. Vital signs WDL. Mid Level notified. Will continue to monitor.

## 2018-03-24 NOTE — Progress Notes (Signed)
Progress Note  Patient Name: Mario Walker Date of Encounter: 03/24/2018  Primary Cardiologist: Nona Dell, MD   Subjective   Ongoing SOB.   Inpatient Medications    Scheduled Meds: . apixaban  5 mg Oral BID  . atorvastatin  40 mg Oral QPM  . carvedilol  25 mg Oral BID  . diltiazem  180 mg Oral Daily  . DULoxetine  60 mg Oral Daily  . insulin aspart  0-15 Units Subcutaneous TID WC  . insulin aspart  0-5 Units Subcutaneous QHS  . insulin aspart  5 Units Subcutaneous TID AC  . insulin glargine  35 Units Subcutaneous QHS  . levalbuterol  0.63 mg Nebulization Q6H WA  . multivitamin with minerals  1 tablet Oral Daily  . pantoprazole  80 mg Oral Daily  . torsemide  20 mg Oral BID  . vitamin C  500 mg Oral Daily   Continuous Infusions:  PRN Meds: acetaminophen, albuterol, fluticasone, HYDROcodone-acetaminophen, ondansetron (ZOFRAN) IV, zolpidem   Vital Signs    Vitals:   03/24/18 0000 03/24/18 0400 03/24/18 0729 03/24/18 0817  BP:   133/73   Pulse:   87   Resp:      Temp: (!) 97.3 F (36.3 C) 98.1 F (36.7 C) (!) 97.5 F (36.4 C)   TempSrc: Oral  Oral   SpO2:   (!) 88% 94%  Weight:  93.8 kg    Height:        Intake/Output Summary (Last 24 hours) at 03/24/2018 0824 Last data filed at 03/24/2018 0012 Gross per 24 hour  Intake -  Output 1600 ml  Net -1600 ml   Filed Weights   03/22/18 0500 03/23/18 0500 03/24/18 0400  Weight: 93.9 kg 94.7 kg 93.8 kg    Telemetry    afib rate controlled - Personally Reviewed  ECG    na  Physical Exam   GEN: No acute distress.   Neck: elevated JVD Cardiac: irreg, normal rate Respiratory: Clear to auscultation bilaterally. GI: Soft, nontender, non-distended  MS:1+ bilateral edema; No deformity. Neuro:  Nonfocal  Psych: Normal affect   Labs    Chemistry Recent Labs  Lab 03/22/18 0423 03/23/18 0351 03/24/18 0406  NA 137 137 137  K 3.7 4.0 4.7  CL 99 96* 95*  CO2 31 37* 33*  GLUCOSE 75 105* 196*    BUN 29* 30* 28*  CREATININE 1.42* 1.58* 1.38*  CALCIUM 8.4* 8.6* 8.7*  GFRNONAA 46* 40* 47*  GFRAA 53* 47* 55*  ANIONGAP 7 4* 9     Hematology Recent Labs  Lab 03/22/18 0423 03/23/18 0351 03/24/18 0406  WBC 10.7* 9.8 11.2*  RBC 3.67* 3.50* 3.67*  HGB 10.1* 9.5* 10.0*  HCT 32.8* 31.9* 33.5*  MCV 89.4 91.1 91.3  MCH 27.5 27.1 27.2  MCHC 30.8 29.8* 29.9*  RDW 15.2 15.4 15.5  PLT 275 244 261    Cardiac EnzymesNo results for input(s): TROPONINI in the last 168 hours. No results for input(s): TROPIPOC in the last 168 hours.   BNP Recent Labs  Lab 03/22/18 0423  BNP 782.0*     DDimer No results for input(s): DDIMER in the last 168 hours.   Radiology    No results found.  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 Janee Morn.  Assessment & Plan    1. Afib/aflutter with RVR - rate contorl with coreg and diltiazem, on eliquis for stroke prevention - may consider attempt at DCCV once on anticoag 3 weeks and pending respiratory status     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 - home O2 4-6 L chronically  3. Pulmonary HTN with right heart failure - 01/2018 echo LVE 55-60%, no WMAs, abnormal diastolic function. Mild to mod RV dilatation, PASP 65, mod MR. Septum shows evidence of RV pressure overload.  - likely primarily group 3 pulm HTN given his lung disease, perhaps some component of group 2 given some evidence of diastolic HF.No strong indication for pulmonary vasodilators at this time. - can consider outpatient VQ, perhaps RHC pending f/u with  primary cardiologist.     4. AKI on CKD - worsening renal function with IV lasix with Cr up to 2. We changed to oral torsemide, Cr continues to trend down.   5. Elevated troponin - up to 0.63 in setting of tachycardia and severe hypoxia. Certaintly factors that could lead to demand ishcemia - echo with normal LVEF, no WMAs -  Difficult candidate for noninvasive testing as well. Caution with regadnoson given his lung disease, would avoid dobutamine due to his difficult to control afib, renal function prohibits contrast for coronary CTA though sigifnicant improvement.Could consider RHC/LHC as outpatient once renal function has gone back to baseline.WIth his ongoing significant SOB cath would be difficult.    6. Acute diastolic HF - 01/2018 echo LVE 16-10%, no WMAs, abnormal diasotlic function indeterminate grade, evidence of RV pressure overload. Mild to mod RV dilatation, PASP 65. -IV lasix stopped  due to signifnicant uptrend in Cr/BUN. Repeat CXR at that time actually showed worsening HF and BNP had increased from 583 to 782. - Changed to torsemide 20mg  bid. Cr down from 2 to 1.3 with diuretic change. He subjectively reports better urine output on oral diuretic vs IV.   - I/Os poorly documented this admission, reordered for strict intake and output. Weight down 1 kg from yesterday.Spoke with nursing about obtaining accurate I/Os.    Follow I/Os today on oral torsemide today with more accurate documentation of I/Os, if poor response may consider evaluation by CHF service.       For questions or updates, please contact [facility] Please consult [email or web address] for contact info under        Signed, Dina Rich, MD  03/24/2018, 8:24 AM

## 2018-03-24 NOTE — Care Management Note (Signed)
Case Management Note  Patient Details  Name: MARVELOUS BOUWENS MRN: [id number] Date of Birth: 1940-11-22   If discussed at Long Length of Stay Meetings, dates discussed:  03/24/2018  Additional Comments:  Hiroto Saltzman, Chrystine Oiler, RN 03/24/2018, 12:06 PM

## 2018-03-24 NOTE — Progress Notes (Signed)
Pt's CBG prior to supper 74. Called to patient's room by NT. Pt pale, lethargic and trembling. Pt states "this is how I feel when my sugar drops". Pt given 8 oz orange juice. CBG dropped to 60.  Pt then given 8 oz more of orange juice and graham crackers/peanut butter. Pt continued to be lethargic and pale. D50 IV 25 ML given per protocol. Pt's CBG increased to 135. Pt states "I feel better but now I'm going to be sleepy". Dr. Catha Gosselin paged and made aware. New orders to follow.

## 2018-03-24 NOTE — Progress Notes (Signed)
EKG results available, mid level aware.

## 2018-03-24 NOTE — Progress Notes (Signed)
Report given to Jessica Mayes RN at this time 

## 2018-03-24 NOTE — Evaluation (Signed)
Physical Therapy Evaluation Patient Details Name: Mario Walker MRN: [id number] DOB: Mar 25, 1941 Today's Date: 03/24/2018    History of Present Illness 77 yo male with onset of CHF and AKI with severe SOB and acute renal failure with elevated demand with high troponin.  He is admitted to IC for management of CHF.  PMHx:  COPD,,a-fib, CHF, HTN, CKD, chronic respiratory failure with 6L O2 at home, pulm HTN, ILD, DM, tachycardia,     PT Comments    Pt was seen for assessment of mobility with extensive difficulties to breathe and manage endurance work.  He is expected to go to rehab but is overall not too far from baseline.  Struggle to breathe is chronic at home.  Follow acutely to increase LE strength, to increase endurance, and to decrease fall risk.    Follow Up Recommendations  SNF     Equipment Recommendations  Rolling walker with 5" wheels    Recommendations for Other Services       Precautions / Restrictions Precautions Precautions: Fall Precaution Comments: ck O2 sats with all activity Restrictions Weight Bearing Restrictions: No    Mobility  Bed Mobility               General bed mobility comments: up in chair when PT arrived  Transfers Overall transfer level: Needs assistance Equipment used: 1 person hand held assist Transfers: Sit to/from Stand Sit to Stand: Min guard         General transfer comment: min guard for safety due to low baseline O2 sats  Ambulation/Gait             General Gait Details: deferred due to O2 sats    Stairs             Wheelchair Mobility    Modified Rankin (Stroke Patients Only)       Balance Overall balance assessment: Needs assistance Sitting-balance support: Feet supported Sitting balance-Leahy Scale: Good     Standing balance support: Single extremity supported;During functional activity Standing balance-Leahy Scale: Fair                              Cognition Arousal/Alertness:  Awake/alert Behavior During Therapy: WFL for tasks assessed/performed Overall Cognitive Status: Within Functional Limits for tasks assessed                                        Exercises      General Comments        Pertinent Vitals/Pain Pain Assessment: No/denies pain    Home Living Family/patient expects to be discharged to:: Private residence Living Arrangements: Spouse/significant other Available Help at Discharge: Family;Available 24 hours/day Type of Home: House Home Access: Stairs to enter Entrance Stairs-Rails: Right Home Layout: One level Home Equipment: Walker - 2 wheels;Cane - single point Additional Comments: reports he is not usually needing an AD    Prior Function Level of Independence: Needs assistance  Gait / Transfers Assistance Needed: RW typically or HHA on furnitur ADL's / Homemaking Assistance Needed: wife assists him     PT Goals (current goals can now be found in the care plan section) Acute Rehab PT Goals Patient Stated Goal: to get stronger and get home PT Goal Formulation: With patient Time For Goal Achievement: 04/07/18 Potential to Achieve Goals: Good    Frequency  Min 2X/week      PT Plan      Co-evaluation              AM-PAC PT "6 Clicks" Daily Activity  Outcome Measure  Difficulty turning over in bed (including adjusting bedclothes, sheets and blankets)?: A Little Difficulty moving from lying on back to sitting on the side of the bed? : A Lot Difficulty sitting down on and standing up from a chair with arms (e.g., wheelchair, bedside commode, etc,.)?: Unable Help needed moving to and from a bed to chair (including a wheelchair)?: A Little Help needed walking in hospital room?: A Little Help needed climbing 3-5 steps with a railing? : A Little 6 Click Score: 15    End of Session Equipment Utilized During Treatment: Gait belt;Oxygen Activity Tolerance: Patient tolerated treatment well;Patient  limited by fatigue;Treatment limited secondary to medical complications (Comment) Patient left: in chair;with call bell/phone within reach Nurse Communication: Mobility status PT Visit Diagnosis: Unsteadiness on feet (R26.81);Muscle weakness (generalized) (M62.81);Difficulty in walking, not elsewhere classified (R26.2)     Time: 0981-1914 PT Time Calculation (min) (ACUTE ONLY): 12 min  Charges:                      Ivar Drape 03/24/2018, 9:16 PM   9:18 PM, 03/24/18 Samul Dada, PT, MS Physical Therapist - [place] [phone] (Office)

## 2018-03-24 NOTE — Progress Notes (Signed)
PROGRESS NOTE    Mario Walker  LKG:[id number] DOB: Apr 07, 1941 DOA: 04/04/2018 PCP: Craig Staggers, MD   Brief Narrative:  HPI on 04-04-2018 by Dr. Leavy Cella Johnson-Pitts Mario Walker is a 77 y.o. male with medical history significant of *COPD, chronic respiratory failure, A. fib, type 2 diabetes, presents with tachycardia.  He went to Dr. Juanetta Gosling office today for follow-up visit.  He was found to be tachycardic.  He was sent over to the ED.  He was found to be in RVR. States he also has some dyspnea on exertion when his palpitations occur.  Patient denies any chest pain.  Was admitted on September 13 for CHF and new onset A. fib.  He was discharged on p.o. Cardizem and Eliquis.  He he states compliance with his medications.    Interim history Patient admitted for paroxysmal atrial fibrillation with RVR and was placed on Cardizem drip.  He then was found to have acute on chronic respiratory failure which has been improving.  So has acute on chronic heart failure, cardiology consulted and appreciated.  Assessment & Plan   Paroxysmal atrial fibrillation with rapid ventricular rate -Patient required Cardizem drip upon admission however has now been transitioned to oral Cardizem as well as metoprolol -Cardiology consulted and appreciated, dose was increased to 180 mg daily -Continue Eliquis -Cardiology may attempt or consider DCCV in 3 weeks once patient has been anticoagulated and his respiratory status is improved  Acute on chronic diastolic congestive heart failure -Possibly triggered by atrial fibrillation with RVR -Echocardiogram February 07, 2018 showed an EF of 55 to 60%, no regional wall motion abnormalities.  Findings consistent with LV diastolic dysfunction, grade intermediate. -Cardiology consulted and appreciated -Chest x-ray showed worsening airspace disease likely CHF, BNP was also noted to be increased -Patient started on torsemide -Continue to monitor intake and output, daily  weights (currently not being documented or collected accurately) -Per cardiology, will continue to monitor I/O, weights for additional day on oral torsemide, if no improvement, patient may need to be seen by the CHF team  Acute on chronic respiratory failure with hypoxia -Patient uses 5 to 6 L of home oxygen at home -Currently he has been weaned to his home amount and is in no respiratory distress -Reviewed hospitalist discussed with Dr. Juanetta Gosling, pulmonology, who feels concentrator will be needed and will follow up with patient in pulmonology outpatient setting  Acute renal failure on chronic kidney disease, stage III -Renal ultrasound shows no significant findings -Suspect secondary to hypotension due to Cardizem -Creatinine 1.38 today -Continue to monitor BMP  Essential hypertension -Continue coreg, diltiazem, demadex  Diabetes mellitus, type II -Continue Lantus, insulin sliding scale and CBG monitoring  Severe pulmonary hypertension -Patient to follow-up with pulmonology as an outpatient -Cardiology also recommending outpatient VQ scan as well as a right heart cardiac catheterization  Mild hyponatremia -resolved, suspect secondary to diuretics -Continue monitor BMP  Elevated troponin -Troponin peaked to 0.63 -Suspect secondary to tachycardia as well as hypoxia leading to demand ischemia -As above, cardiology has been following.  Currently he is not a candidate for invasive testing.  Physical deconditioning  -PT recommend HH -Patient refused HH services   DVT Prophylaxis  Eliquis  Code Status: Partial Code, DNI  Family Communication: Wife at bedside  Disposition Plan: Admitted. Pending further recommendations from cardiology. Continue diuresis and monitoring.  Consultants Cardiology  Procedures  None  Antibiotics   Anti-infectives (From admission, onward)   None      Subjective:  Mario Walker seen and examined today.  Patient feels his breathing has  improved. Continues to have shortness of breath with minimal activity. Denies chest pain, abdominal pain, N/V/D/C, dizziness, headache.  Objective:   Vitals:   03/24/18 0000 03/24/18 0400 03/24/18 0729 03/24/18 0817  BP:   133/73   Pulse:   87   Resp:      Temp: (!) 97.3 F (36.3 C) 98.1 F (36.7 C) (!) 97.5 F (36.4 C)   TempSrc: Oral  Oral   SpO2:   (!) 88% 94%  Weight:  93.8 kg    Height:        Intake/Output Summary (Last 24 hours) at 03/24/2018 0925 Last data filed at 03/24/2018 0825 Gross per 24 hour  Intake 200 ml  Output 1600 ml  Net -1400 ml   Filed Weights   03/22/18 0500 03/23/18 0500 03/24/18 0400  Weight: 93.9 kg 94.7 kg 93.8 kg   Exam  General: Well developed, well nourished, NAD, appears stated age  HEENT: NCAT, mucous membranes moist.   Neck: Supple  Cardiovascular: S1 S2 auscultated, irregular  Respiratory: Diminished breath sounds, but clear  Abdomen: Soft, nontender, nondistended, + bowel sounds  Extremities: warm dry without cyanosis clubbing. +LE edema B/L   Neuro: AAOx3, nonfocal  Psych: Appropriate mood and affect, pleasant  Data Reviewed: I have personally reviewed following labs and imaging studies  CBC: Recent Labs  Lab 03/20/18 0502 03/21/18 0354 03/22/18 0423 03/23/18 0351 03/24/18 0406  WBC 11.3* 10.9* 10.7* 9.8 11.2*  NEUTROABS 8.5* 7.9* 7.9* 6.6 9.4*  HGB 10.0* 9.7* 10.1* 9.5* 10.0*  HCT 33.1* 32.1* 32.8* 31.9* 33.5*  MCV 89.5 89.7 89.4 91.1 91.3  PLT 282 278 275 244 261   Basic Metabolic Panel: Recent Labs  Lab 03/19/18 0431 03/20/18 0502 03/21/18 0354 03/22/18 0423 03/23/18 0351 03/24/18 0406  NA 133* 131* 132* 137 137 137  K 4.1 4.3 5.7* 3.7 4.0 4.7  CL 95* 95* 94* 99 96* 95*  CO2 27 27 29 31  37* 33*  GLUCOSE 109* 128* 201* 75 105* 196*  BUN 40* 45* 43* 29* 30* 28*  CREATININE 1.86* 1.83* 2.09* 1.42* 1.58* 1.38*  CALCIUM 8.7* 8.5* 8.9 8.4* 8.6* 8.7*  MG 1.9  --   --  2.0  --   --    GFR: Estimated  Creatinine Clearance: 47.3 mL/min (A) (by C-G formula based on SCr of 1.38 mg/dL (H)). Liver Function Tests: No results for input(s): AST, ALT, ALKPHOS, BILITOT, PROT, ALBUMIN in the last 168 hours. No results for input(s): LIPASE, AMYLASE in the last 168 hours. No results for input(s): AMMONIA in the last 168 hours. Coagulation Profile: No results for input(s): INR, PROTIME in the last 168 hours. Cardiac Enzymes: No results for input(s): CKTOTAL, CKMB, CKMBINDEX, TROPONINI in the last 168 hours. BNP (last 3 results) No results for input(s): PROBNP in the last 8760 hours. HbA1C: No results for input(s): HGBA1C in the last 72 hours. CBG: Recent Labs  Lab 03/23/18 1613 03/23/18 2039 03/23/18 2308 03/23/18 2347 03/24/18 0728  GLUCAP 233* 126* 57* 114* 166*   Lipid Profile: No results for input(s): CHOL, HDL, LDLCALC, TRIG, CHOLHDL, LDLDIRECT in the last 72 hours. Thyroid Function Tests: No results for input(s): TSH, T4TOTAL, FREET4, T3FREE, THYROIDAB in the last 72 hours. Anemia Panel: No results for input(s): VITAMINB12, FOLATE, FERRITIN, TIBC, IRON, RETICCTPCT in the last 72 hours. Urine analysis:    Component Value Date/Time   COLORURINE YELLOW 03/18/2018 0901  APPEARANCEUR CLEAR 03/18/2018 0901   LABSPEC 1.016 03/18/2018 0901   PHURINE 5.0 03/18/2018 0901   GLUCOSEU 50 (A) 03/18/2018 0901   HGBUR NEGATIVE 03/18/2018 0901   BILIRUBINUR NEGATIVE 03/18/2018 0901   KETONESUR NEGATIVE 03/18/2018 0901   PROTEINUR NEGATIVE 03/18/2018 0901   NITRITE NEGATIVE 03/18/2018 0901   LEUKOCYTESUR TRACE (A) 03/18/2018 0901   Sepsis Labs: @LABRCNTIP (procalcitonin:4,lacticidven:4)  ) Recent Results (from the past 240 hour(s))  Culture, Urine     Status: None   Collection Time: 03/18/18  9:01 AM  Result Value Ref Range Status   Specimen Description   Final    URINE, RANDOM Performed at [facility], [address], [place], [place] [address]    Special Requests   Final     NONE Performed at [facility], [address] [place], [place] [address]    Culture   Final    Multiple bacterial morphotypes present, none predominant. Suggest appropriate recollection if clinically indicated.   Report Status 03/19/2018 FINAL  Final      Radiology Studies: No results found.   Scheduled Meds: . apixaban  5 mg Oral BID  . atorvastatin  40 mg Oral QPM  . carvedilol  25 mg Oral BID  . diltiazem  180 mg Oral Daily  . DULoxetine  60 mg Oral Daily  . insulin aspart  0-15 Units Subcutaneous TID WC  . insulin aspart  0-5 Units Subcutaneous QHS  . insulin aspart  5 Units Subcutaneous TID AC  . insulin glargine  35 Units Subcutaneous QHS  . levalbuterol  0.63 mg Nebulization Q6H WA  . multivitamin with minerals  1 tablet Oral Daily  . pantoprazole  80 mg Oral Daily  . torsemide  40 mg Oral BID  . vitamin C  500 mg Oral Daily   Continuous Infusions:   LOS: 6 days   Time Spent in minutes   30 minutes  Hebe Merriwether D.O. on 03/24/2018 at 9:25 AM  Between 7am to 7pm - Please see pager noted on [email or web address]  After 7pm go to [email or web address]  And look for the night coverage person covering for me after hours  [facility] Office  [phone]

## 2018-03-25 ENCOUNTER — Other Ambulatory Visit (HOSPITAL_COMMUNITY): Payer: Self-pay

## 2018-03-25 ENCOUNTER — Inpatient Hospital Stay (HOSPITAL_COMMUNITY): Payer: Medicare HMO

## 2018-03-25 ENCOUNTER — Encounter (HOSPITAL_COMMUNITY): Payer: Self-pay | Admitting: Primary Care

## 2018-03-25 DIAGNOSIS — G931 Anoxic brain damage, not elsewhere classified: Secondary | ICD-10-CM

## 2018-03-25 DIAGNOSIS — I469 Cardiac arrest, cause unspecified: Secondary | ICD-10-CM

## 2018-03-25 DIAGNOSIS — Z7189 Other specified counseling: Secondary | ICD-10-CM

## 2018-03-25 DIAGNOSIS — R570 Cardiogenic shock: Secondary | ICD-10-CM

## 2018-03-25 DIAGNOSIS — Z515 Encounter for palliative care: Secondary | ICD-10-CM

## 2018-03-25 DIAGNOSIS — J9621 Acute and chronic respiratory failure with hypoxia: Secondary | ICD-10-CM

## 2018-03-25 DIAGNOSIS — N179 Acute kidney failure, unspecified: Secondary | ICD-10-CM

## 2018-03-25 LAB — COMPREHENSIVE METABOLIC PANEL
ALBUMIN: 2.7 g/dL — AB (ref 3.5–5.0)
ALK PHOS: 56 U/L (ref 38–126)
ALT: 20 U/L (ref 0–44)
ANION GAP: 13 (ref 5–15)
AST: 29 U/L (ref 15–41)
BILIRUBIN TOTAL: 1.2 mg/dL (ref 0.3–1.2)
BUN: 33 mg/dL — ABNORMAL HIGH (ref 8–23)
CHLORIDE: 94 mmol/L — AB (ref 98–111)
CO2: 29 mmol/L (ref 22–32)
Calcium: 8.1 mg/dL — ABNORMAL LOW (ref 8.9–10.3)
Creatinine, Ser: 2.42 mg/dL — ABNORMAL HIGH (ref 0.61–1.24)
GFR, EST AFRICAN AMERICAN: 28 mL/min — AB (ref >60)
GFR, EST NON AFRICAN AMERICAN: 24 mL/min — AB (ref >60)
Glucose, Bld: 154 mg/dL — ABNORMAL HIGH (ref 70–99)
Potassium: 5 mmol/L (ref 3.5–5.1)
Sodium: 136 mmol/L (ref 135–145)
Total Protein: 6.4 g/dL — ABNORMAL LOW (ref 6.5–8.1)

## 2018-03-25 LAB — GLUCOSE, CAPILLARY
GLUCOSE-CAPILLARY: 112 mg/dL — AB (ref 70–99)
GLUCOSE-CAPILLARY: 146 mg/dL — AB (ref 70–99)
Glucose-Capillary: 94 mg/dL (ref 70–99)
Glucose-Capillary: 106 mg/dL — ABNORMAL HIGH (ref 70–99)

## 2018-03-25 LAB — CBC WITH DIFFERENTIAL/PLATELET
BASOS ABS: 0 10*3/uL (ref 0.0–0.1)
BASOS PCT: 0 %
EOS ABS: 0 10*3/uL (ref 0.0–0.7)
EOS PCT: 0 %
HCT: 31.4 % — ABNORMAL LOW (ref 39.0–52.0)
Hemoglobin: 9.4 g/dL — ABNORMAL LOW (ref 13.0–17.0)
LYMPHS PCT: 11 %
Lymphs Abs: 1 10*3/uL (ref 0.7–4.0)
MCH: 27 pg (ref 26.0–34.0)
MCHC: 29.9 g/dL — ABNORMAL LOW (ref 30.0–36.0)
MCV: 90.2 fL (ref 78.0–100.0)
MONO ABS: 0.7 10*3/uL (ref 0.1–1.0)
Monocytes Relative: 8 %
Neutro Abs: 7 10*3/uL (ref 1.7–7.7)
Neutrophils Relative %: 81 %
Platelets: 192 10*3/uL (ref 150–400)
RBC: 3.48 MIL/uL — AB (ref 4.22–5.81)
RDW: 15.7 % — AB (ref 11.5–15.5)
WBC: 8.7 10*3/uL (ref 4.0–10.5)

## 2018-03-25 LAB — BASIC METABOLIC PANEL
ANION GAP: 8 (ref 5–15)
BUN: 31 mg/dL — ABNORMAL HIGH (ref 8–23)
CALCIUM: 8.5 mg/dL — AB (ref 8.9–10.3)
CO2: 33 mmol/L — ABNORMAL HIGH (ref 22–32)
Chloride: 93 mmol/L — ABNORMAL LOW (ref 98–111)
Creatinine, Ser: 2.1 mg/dL — ABNORMAL HIGH (ref 0.61–1.24)
GFR, EST AFRICAN AMERICAN: 33 mL/min — AB (ref >60)
GFR, EST NON AFRICAN AMERICAN: 29 mL/min — AB (ref >60)
Glucose, Bld: 130 mg/dL — ABNORMAL HIGH (ref 70–99)
POTASSIUM: 4.7 mmol/L (ref 3.5–5.1)
SODIUM: 134 mmol/L — AB (ref 135–145)

## 2018-03-25 LAB — CBC
HEMATOCRIT: 32.4 % — AB (ref 39.0–52.0)
HEMOGLOBIN: 9.7 g/dL — AB (ref 13.0–17.0)
MCH: 27.6 pg (ref 26.0–34.0)
MCHC: 29.9 g/dL — ABNORMAL LOW (ref 30.0–36.0)
MCV: 92 fL (ref 78.0–100.0)
Platelets: 207 10*3/uL (ref 150–400)
RBC: 3.52 MIL/uL — AB (ref 4.22–5.81)
RDW: 15.7 % — ABNORMAL HIGH (ref 11.5–15.5)
WBC: 19.9 10*3/uL — ABNORMAL HIGH (ref 4.0–10.5)

## 2018-03-25 LAB — BLOOD GAS, ARTERIAL
ACID-BASE EXCESS: 3.6 mmol/L — AB (ref 0.0–2.0)
Bicarbonate: 27.3 mmol/L (ref 20.0–28.0)
Drawn by: 270161
FIO2: 100
LHR: 16 {breaths}/min
O2 SAT: 97 %
PATIENT TEMPERATURE: 37
PCO2 ART: 49.3 mmHg — AB (ref 32.0–48.0)
PEEP: 5 cmH2O
VT: 500 mL
pH, Arterial: 7.37 (ref 7.350–7.450)
pO2, Arterial: 102 mmHg (ref 83.0–108.0)

## 2018-03-25 LAB — TRIGLYCERIDES
Triglycerides: 65 mg/dL (ref <150)
Triglycerides: 69 mg/dL (ref <150)

## 2018-03-25 LAB — TROPONIN I
Troponin I: 0.21 ng/mL (ref <0.03)
Troponin I: 0.22 ng/mL (ref <0.03)

## 2018-03-25 MED ORDER — NOREPINEPHRINE 4 MG/250ML-% IV SOLN
0.0000 ug/min | INTRAVENOUS | Status: DC
  Administered 2018-03-25: 11 ug/min via INTRAVENOUS
  Filled 2018-03-25 (×4): qty 250

## 2018-03-25 MED ORDER — LEVALBUTEROL HCL 0.63 MG/3ML IN NEBU
0.6300 mg | INHALATION_SOLUTION | Freq: Four times a day (QID) | RESPIRATORY_TRACT | Status: DC
  Administered 2018-03-25: 0.63 mg via RESPIRATORY_TRACT

## 2018-03-25 MED ORDER — MORPHINE 100MG IN NS 100ML (1MG/ML) PREMIX INFUSION
10.0000 mg/h | INTRAVENOUS | Status: DC
  Filled 2018-03-25: qty 100

## 2018-03-25 MED ORDER — CHLORHEXIDINE GLUCONATE 0.12% ORAL RINSE (MEDLINE KIT)
15.0000 mL | Freq: Two times a day (BID) | OROMUCOSAL | Status: DC
  Administered 2018-03-25: 15 mL via OROMUCOSAL

## 2018-03-25 MED ORDER — MORPHINE BOLUS VIA INFUSION
5.0000 mg | INTRAVENOUS | Status: DC | PRN
  Filled 2018-03-25: qty 20

## 2018-03-25 MED ORDER — PROPOFOL 1000 MG/100ML IV EMUL: 5.0000 ug/kg/min | INTRAVENOUS | Status: DC

## 2018-03-25 MED ORDER — LORAZEPAM 2 MG/ML IJ SOLN
2.0000 mg | Freq: Once | INTRAMUSCULAR | Status: AC
  Administered 2018-03-25: 2 mg via INTRAVENOUS
  Filled 2018-03-25: qty 1

## 2018-03-25 MED ORDER — FUROSEMIDE 10 MG/ML IJ SOLN
40.0000 mg | Freq: Once | INTRAMUSCULAR | Status: AC
  Administered 2018-03-25: 40 mg via INTRAVENOUS
  Filled 2018-03-25: qty 4

## 2018-03-25 MED ORDER — ORAL CARE MOUTH RINSE
15.0000 mL | OROMUCOSAL | Status: DC
  Administered 2018-03-25 (×3): 15 mL via OROMUCOSAL

## 2018-03-25 MED ORDER — LEVETIRACETAM IN NACL 500 MG/100ML IV SOLN
500.0000 mg | Freq: Two times a day (BID) | INTRAVENOUS | Status: DC
  Filled 2018-03-25: qty 100

## 2018-03-25 MED ORDER — FENTANYL CITRATE (PF) 100 MCG/2ML IJ SOLN
50.0000 ug | INTRAMUSCULAR | Status: DC | PRN
  Administered 2018-03-25 (×2): 50 ug via INTRAVENOUS
  Filled 2018-03-25 (×2): qty 2

## 2018-03-25 MED ORDER — LEVETIRACETAM IN NACL 1000 MG/100ML IV SOLN
1000.0000 mg | Freq: Once | INTRAVENOUS | Status: AC
  Administered 2018-03-25: 1000 mg via INTRAVENOUS
  Filled 2018-03-25: qty 100

## 2018-03-25 MED ORDER — FENTANYL CITRATE (PF) 100 MCG/2ML IJ SOLN: 50.0000 ug | INTRAMUSCULAR | Status: DC | PRN

## 2018-03-25 MED ORDER — PROPOFOL 1000 MG/100ML IV EMUL
5.0000 ug/kg/min | INTRAVENOUS | Status: DC
  Administered 2018-03-25: 25 ug/kg/min via INTRAVENOUS
  Administered 2018-03-25: 5 ug/kg/min via INTRAVENOUS
  Filled 2018-03-25 (×2): qty 100

## 2018-03-25 MED FILL — Medication: Qty: 1 | Status: AC

## 2018-03-30 MED FILL — Norepinephrine Bitartrate IV Soln 1 MG/ML (Base Equivalent): INTRAVENOUS | Qty: 4 | Status: CN

## 2018-03-30 MED FILL — Dextrose Inj 5%: INTRAVENOUS | Qty: 250 | Status: CN

## 2018-03-30 MED FILL — Norepinephrine-Dextrose IV Solution 4 MG/250ML-5%: INTRAVENOUS | Qty: 250 | Status: AC

## 2018-03-31 ENCOUNTER — Telehealth: Payer: Self-pay | Admitting: Pulmonary Disease

## 2018-03-31 NOTE — Telephone Encounter (Signed)
Received DC for Burial 03/31/18 from [facility] for Sood to sign at [employer].

## 2018-04-01 ENCOUNTER — Telehealth: Payer: Self-pay | Admitting: Pulmonary Disease

## 2018-04-01 NOTE — Telephone Encounter (Signed)
04/01/18 Received Original Signed DC from Dr. Craige Cotta and mailed it to [facility]. Vital Records.

## 2018-04-06 ENCOUNTER — Ambulatory Visit: Payer: Self-pay | Admitting: Student

## 2018-04-22 NOTE — H&P (Signed)
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 He was admitted to [facility] 04-08-2018 for a-fib with RVR. He was started on a cardizem gtt. He was found to have acute on chronic respiratory failure which had been improving. He also has acute on chronic heart failure. The night of 10/3 at 9 pm  the patient complained of chest pain.  EKG and troponin obtained- no changes in EKG. Troponin 0.21 (lower than earlier this admission). Patient then became unresponsive with PEA and asystole. CPR was started, and continued for 25-30 minutes. Although patient was  a DNI, patient's wife insisted on intubation. Currently on propofol and levophed. Patient began to have seizure activity, loaded with keppra.  Patient will be transferred to [facility]. Of note, neurology, Dr. Amada Jupiter has been consulted and will see the patient upon arrival. PCCM will admit and manage care.   Past Medical History  As noted above  Significant Hospital Events   2018/04/08>> Admission to [facility] for A fib with RVR 03/24/2018>> PEA arrest with seizure like activity post arrest 10/4>> ETT  Consults: date of consult/date signed off & final recs:  Neurrology : Dr. Amada Jupiter   Procedures (surgical and bedside):  10/3>> ETT  Significant Diagnostic Tests:  10/4>> Echo   Micro Data:  9/27 Urine Culture >> re-collection suggested  Due to Multiple bacterial morphotypes present  Antimicrobials:  None   Subjective:  Cardiac  arrest last night, intubated and seizing No new complaints  Objective   Blood pressure 99/60, pulse (!) 101, temperature 98.3 F (36.8 C), temperature source Oral, resp. rate (!) 30, height 5\' 6"  (1.676 m), weight 95.8 kg, SpO2 99 %.    Vent Mode: PRVC FiO2 (%):  [100 %] 100 % Set Rate:  [16 bmp] 16 bmp Vt Set:  [500 mL] 500 mL PEEP:  [5 cmH20] 5 cmH20 Plateau Pressure:  [22 cmH20-25 cmH20] 22 cmH20   Intake/Output Summary (Last 24 hours) at 04/11/2018 1054 Last data filed at 03/29/2018 1041 Gross per 24 hour  Intake 1057.52 ml  Output 125 ml  Net 932.52 ml   Filed Weights   03/23/18 0500 03/24/18 0400 04/10/2018 0514  Weight: 94.7 kg 93.8 kg 95.8 kg    Examination: General: Chronically ill appearing male, unresponsive HENT: [place]/AT, pupils are equal and sluggish, EOM-spontaneous and MMM Lungs: Coarse BS diffusely Cardiovascular: RRR, Nl S1/S2 and -M/R/G Abdomen: Soft, NT, ND and +BS Extremities: -edema and -tenderness Neuro: Unresponsive with myoclonus present Skin: Intact  Resolved Hospital Problem list   None  Assessment & Plan:   Cardio pulmonary  PEA  Arrest 10/3 at 9 pm EKG without signs of ischemia Troponin 0.21 ( Down from 0.63 on admission suspected demand ischemia)  CPR x 25-30 minutes Hypotensive History of a fib/ flutter , chronic diastolc HF ( Eliquis) Eliquis for stroke prevention 01/2018 echo LVE 55-60%, no WMAs, abnormal diastolic function. Mild to mod RV dilatation, PASP 65, mod MR. Septum shows evidence of RV pressure overload.  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 Amada Jupiter EEG monitoring per neuro  Acute on Chronic Respiratory Failure Followed By Dr. Juanetta Gosling outpatient COPD ( ?? 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 PE CXR 10/4>> Personally reviewed>>Cardiomegaly, diffuse airspace disease favoring pulmonary edema, suspected pleural effusions consistent with CHF,  Home oxygen 5-6 L baseline Plan ABG on arrival Wean FIO2 and PEEP as able Full vent support for now VAP precautions Minimize Sedation >> continue propofol for now  Saturation goal 88-92% CXR 10/5 am Tend MAG VQ scan to RO PE  When/ if  stable enough  Acute on chronic Renal Failure, Stage III Renal US negative for significant findings Suspected 2/2  Hypotension due to cardizem Creatinine 2.42 on 10/4 Mild hyponatremia hypocalcemia Plan: Monitor BMET and UO Avoid nephrotoxic medications  Replete electrolytes as needed Check ionized calcium   Essential HTN Currently hypotensive post arrest Plan: Hold anti hypertensive medications Currently on Levophed  DM Plan CBG Q 4 SSI  GI Hypoalbuminemia Plan TF SUP  ID Leukocytosis T max 99.6 High risk aspiration during PEA arrest Plan PCT  Consider Unasyn  Trend fever. WBC/ CXR Sputum Culture Culture as is clinically indicated  Hematology Anemia of chronic illness Plan: Trend CBC Transfuse for HGB < 7   Disposition / Summary of Today's Plan 04/03/2018   ICU transfer to [facility] for PCCM management and Neuro consult Will need CT/MRI brain to evaluate for anoxic damage Consider VQ scan to RO PE Will need to evaluate if cause of seizures is anoxic injury Will need to align goals of care with plan of care    Diet: NPO Pain/Anxiety/Delirium protocol (if indicated):  Propofol VAP protocol (if indicated): Yes DVT prophylaxis: Eliquis / heparin GI prophylaxis: Protonix Hyperglycemia protocol: Moderate SSI Mobility: BR Code Status: Partial Family Communication: See below  Labs   CBC: Recent Labs  Lab 03/21/18 0354 03/22/18 0423 03/23/18 0351 03/24/18 0406 04/12/2018 0241 04/02/2018 0734  WBC 10.9* 10.7* 9.8 11.2* 8.7 19.9*  NEUTROABS 7.9* 7.9* 6.6 9.4* 7.0  --   HGB 9.7* 10.1* 9.5* 10.0* 9.4* 9.7*  HCT 32.1* 32.8* 31.9* 33.5* 31.4* 32.4*  MCV 89.7 89.4 91.1 91.3 90.2 92.0  PLT 278 275 244 261 192 207    Basic Metabolic Panel: Recent Labs  Lab 03/19/18 0431  03/22/18 0423 03/23/18 0351 03/24/18 0406 04/18/2018 0241 04/06/2018 0734  NA 133*   < > 137 137 137 134* 136  K 4.1   < > 3.7 4.0 4.7 4.7 5.0  CL 95*   < > 99 96* 95* 93* 94*  CO2 27   < > 31 37* 33* 33* 29  GLUCOSE 109*   < > 75 105* 196* 130* 154*  BUN 40*   < > 29* 30* 28* 31* 33*  CREATININE 1.86*   < > 1.42* 1.58* 1.38* 2.10* 2.42*  CALCIUM 8.7*   < > 8.4* 8.6* 8.7* 8.5* 8.1*  MG 1.9  --  2.0  --   --   --   --    < > = values in this interval not displayed.   GFR: Estimated Creatinine Clearance: 27.3 mL/min (A) (by C-G formula based on SCr of 2.42 mg/dL (H)). Recent Labs  Lab 03/23/18 0351 03/24/18 0406 04/16/2018 0241 04/16/2018 0734  WBC 9.8 11.2* 8.7 19.9*    Liver Function Tests: Recent Labs  Lab 03/23/2018 0734  AST 29  ALT 20  ALKPHOS 56  BILITOT 1.2  PROT 6.4*  ALBUMIN 2.7*   No results for input(s): LIPASE, AMYLASE in the last 168 hours. No results for input(s): AMMONIA in the last 168 hours.  ABG    Component Value Date/Time   PHART 7.37 04/18/2018 1015   PCO2ART 49.3 (H) 04/20/2018 1015   PO2ART 102 03/23/2018 1015   HCO3 27.3 04/21/2018 1015   O2SAT 97.0 03/28/2018 1015     Coagulation Profile: No results for input(s): INR, PROTIME in the last 168 hours.  Cardiac Enzymes: Recent Labs  Lab 03/24/18 2112 04/12/2018 0241 04/09/2018 0850   TROPONINI 0.21* 0.21* 0.22*    HbA1C: Hgb A1c MFr Bld  Date/Time Value Ref Range Status  02/06/2018 12:03 PM 6.6 (H) 4.8 - 5.6 % Final    Comment:    (NOTE) Pre diabetes:          5.7%-6.4% Diabetes:              >6.4% Glycemic control for   <7.0% adults with diabetes     CBG: Recent Labs  Lab 03/24/18 2007 03/24/18 2145 03/31/2018 0545 03/30/2018 0634 04/14/2018 0904  GLUCAP 183* 205* 94 112* 106*    Admitting History of Present Illness.     Review of Systems:   Unable as sedated and intubated  Past Medical History  He,  has a past medical history of Atrial fibrillation with RVR (HCC), Chronic diastolic CHF (congestive heart failure) (HCC), Chronic kidney disease, Chronic respiratory failure with hypoxia and hypercapnia (HCC), COPD (chronic obstructive pulmonary disease) (HCC), Cor pulmonale (chronic) (HCC), Essential hypertension, GERD (gastroesophageal reflux disease), Hyperlipidemia, and Type 2 diabetes mellitus (HCC).   Surgical History    Past Surgical History:  Procedure Laterality Date  . COLON SURGERY    . ROTATOR CUFF REPAIR       Social History   Social History   Socioeconomic History  . Marital status: Married    Spouse name: Not on file  . Number of children: Not on file  . Years of education: Not on file  . Highest education level: Not on file  Occupational History  . Not on file  Social Needs  . Financial resource strain: Not on file  . Food insecurity:    Worry: Not on file    Inability: Not on file  . Transportation needs:    Medical: Not on file    Non-medical: Not on file  Tobacco Use  . Smoking status: Former Smoker    Types: Cigarettes  . Smokeless tobacco: Never Used  Substance and Sexual Activity  . Alcohol use: No  . Drug use: No  . Sexual activity: Not on file  Lifestyle  . Physical activity:    Days per week: Not on file    Minutes per session: Not on file  . Stress: Not on file  Relationships  . Social connections:     Talks on phone: Not on file    Gets together: Not on file    Attends religious service:  Not on file    Active member of club or organization: Not on file    Attends meetings of clubs or organizations: Not on file    Relationship status: Not on file  . Intimate partner violence:    Fear of current or ex partner: Not on file    Emotionally abused: Not on file    Physically abused: Not on file    Forced sexual activity: Not on file  Other Topics Concern  . Not on file  Social History Narrative  . Not on file  ,  reports that he has quit smoking. His smoking use included cigarettes. He has never used smokeless tobacco. He reports that he does not drink alcohol or use drugs.   Family History   His family history includes Heart attack in his brother; Hypertension in his father.   Allergies No Known Allergies   Home Medications  Prior to Admission medications   Medication Sig Start Date End Date Taking? Authorizing Provider  albuterol (PROVENTIL HFA;VENTOLIN HFA) 108 (90 Base) MCG/ACT inhaler Inhale 2 puffs into the lungs every 6 (six) hours as needed for wheezing or shortness of breath. 03/08/18  Yes Tat, Onalee Hua, MD  apixaban (ELIQUIS) 5 MG TABS tablet Take 1 tablet (5 mg total) by mouth 2 (two) times daily. 03/08/18  Yes Tat, Onalee Hua, MD  atorvastatin (LIPITOR) 40 MG tablet Take 1 tablet by mouth every evening.    Yes [provider]  carvedilol (COREG) 25 MG tablet Take 25 mg by mouth 2 (two) times daily.  01/19/14  Yes [provider]  Cyanocobalamin (VITAMIN B-12 PO) Take 1 tablet by mouth daily.   Yes [provider]  diltiazem (CARDIZEM CD) 120 MG 24 hr capsule Take 1 capsule (120 mg total) by mouth daily. 03/08/18  Yes Tat, Onalee Hua, MD  DULoxetine (CYMBALTA) 60 MG capsule Take 60 mg by mouth daily.   Yes [provider]  fluticasone (FLONASE) 50 MCG/ACT nasal spray Place 1-2 sprays into both nostrils daily as needed for allergies or rhinitis.   Yes  [provider]  furosemide (LASIX) 40 MG tablet Take 1 tablet (40 mg total) by mouth daily. 03/09/18  Yes Tat, Onalee Hua, MD  glimepiride (AMARYL) 2 MG tablet Take 1 tablet by mouth daily. 03/05/14  Yes [provider]  HYDROcodone-acetaminophen (NORCO) 7.5-325 MG tablet Take 1 tablet by mouth every 6 (six) hours as needed for moderate pain.   Yes [provider]  insulin aspart (NOVOLOG) 100 UNIT/ML injection Inject 5 Units into the skin 3 (three) times daily before meals.    Yes [provider]  ipratropium-albuterol (DUONEB) 0.5-2.5 (3) MG/3ML SOLN Inhale 3 mLs into the lungs 3 (three) times daily as needed (for shortness of breath/wheezing).  02/15/18  Yes [provider]  LEVEMIR FLEXTOUCH 100 UNIT/ML Pen Inject 35 Units into the skin at bedtime.  02/20/18  Yes [provider]  Multiple Vitamin (MULTIVITAMIN WITH MINERALS) TABS tablet Take 1 tablet by mouth daily.   Yes [provider]  omeprazole (PRILOSEC) 40 MG capsule Take 1 capsule by mouth daily.  01/19/14  Yes [provider]  vitamin C (ASCORBIC ACID) 500 MG tablet Take 500 mg by mouth daily.   Yes [provider]  predniSONE (DELTASONE) 20 MG tablet Take 2 tablets (40 mg total) by mouth daily with breakfast. X 3 days. Then 1 tablet (20 mg) daily x 3 days Patient not taking: Reported on 03/07/2018 03/09/18   Catarina Hartshorn, MD  sildenafil (REVATIO) 20 MG tablet Take 1 tablet (20 mg total) by mouth 3 (three) times daily. 03/08/18   Catarina Hartshorn, MD    Bevelyn Ngo, AGACNP-BC [place] Pul[facility]/Critical Care Medicine Pager # [phone] 04/20/2018 11:56 AM  Attending Note:  77 year old male with extensive PMH who presents to PCCM from [facility] after a cardiac arrest overnight.  On exam, when off propofol, myoclonus is noted with coarse BS in all lung fields.  I reviewed CXR myself, pulmonary edema noted.  Discussed with [facility]-MD.  Will not cool patient, outside window.  EEG  ordered.  Neurology consult called.  Will not order MRI at this point unless neurology feels necessary.  Levophed for BP support.  D/C propofol until seen by neurology.  Spoke with family extensively, informed them that patient is critically ill and has evidence of myoclonus which usually indicates anoxic injury.  After a long discussion, they asked that we no longer do anything to the patient that would hurt him, refused procedures and requested comfort until the rest of the family arrives.  Will make a full DNR with no further escalation of care.  Leave withdrawal orders on the chart to be released by RN once family arrives.  The patient is critically ill with multiple organ systems failure and requires high complexity decision making for assessment and support, frequent evaluation and titration of therapies, application of advanced monitoring technologies and extensive interpretation of multiple databases.   Critical Care Time devoted to patient care services described in this note is  95  Minutes. This time reflects time of care of this signee Dr Koren Bound. This critical care time does not reflect procedure time, or teaching time or supervisory time of PA/NP/Med student/Med Resident etc but could involve care discussion time.  Alyson Reedy, M.D. [facility] Pulmonary/Critical Care Medicine. Pager: [phone]. After hours pager: [phone].

## 2018-04-22 NOTE — Significant Event (Signed)
Mario Walker was admitted on 03/08/2018 with a fib RVR, has since been converted to oral diltiazem and seemed to be stable all night until complaining of not feeling well this morning, felt that his sugar might be low but with no specific complaint.   He was soon found slumped over with no pulse, asystole on monitor. Responded to code blue to find high-quality CPR in progress (see code sheet for details). After multiple rounds of epinephrine, a pulse was obtained. Patient's wife at that time asked that he be intubated. He was intubated by ED physician before losing pulse again briefly with asystole again.   He is now intubated and started on Levophed infusion. Family is deciding on goals of care now. Will transfer to ICU.

## 2018-04-22 NOTE — Progress Notes (Signed)
Initial Nutrition Assessment  DOCUMENTATION CODES:   Obesity unspecified  INTERVENTION:   If tube feeds initiated, recommend Vital HP @40ml /hr + Prostat 30ml TID via tube  Free water flushes of 30ml q 4 hrs  Regimen provides 1260kcal/day, 129g/day protein, 972ml/day water   Recommend liquid MVI daily via tube   NUTRITION DIAGNOSIS:   Inadequate oral intake related to acute illness(pt sedated and ventilated ) as evidenced by NPO status.  GOAL:   Provide needs based on ASPEN/SCCM guidelines  MONITOR:   Vent status, Labs, Weight trends, Skin, I & O's  REASON FOR ASSESSMENT:   Ventilator    ASSESSMENT:   77 y.o. 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 Unable to have OGT placed x 4 attempts. 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 Per chart, pt eating 50-75% of meals in hospital piror to cardiac arrest. Per chart, pt with 13lb weight gain since admit; pt noted to have moderate edema. Per chart, pt appears weight stable for at least 2 months pta but there is no weight history in chart prior to August. RD will monitor for GOC vs the need for nutrition support. If enteral access is needed, consider cotrak tube placement.   Medications reviewed and include: insulin, protonix, vitamin C, MVI, kepra, levophed  Labs reviewed: Cl 94(L), BUN 33(H), creat 2.42(H) Wbc- 19.9(H), Hgb 9.7(L), Hct 32.4(L)  Patient is currently intubated on ventilator support MV: 7.6 L/min Temp (24hrs), Avg:98.6 F (37 C), Min:98.2 F (36.8 C), Max:99.6 F (37.6 C)  Propofol: none  UOP-  MAP- >35mmHg  Unable to  complete Nutrition-Focused physical exam at this time.   Diet Order:   Diet Order            Diet NPO time specified  Diet effective now             EDUCATION NEEDS:   Not appropriate for education at this time  Skin:  Skin Assessment: Reviewed RN Assessment(ecchymosis )  Last BM:  10/3  Height:   Ht Readings from Last 1 Encounters:  02/28/2018 5\' 6"  (1.676 m)    Weight:   Wt Readings from Last 1 Encounters:  03-28-2018 95.8 kg    Ideal Body Weight:  64.5 kg  BMI:  Body mass index is 34.09 kg/m.  Estimated Nutritional Needs:   Kcal:  1000-1256kcal/day   Protein:  >129g/day   Fluid:  >1.6L/day   Betsey Holiday MS, RD, LDN Pager #- [phone] Office#- [phone] After Hours Pager: [phone]

## 2018-04-22 NOTE — Progress Notes (Addendum)
Pt transported from [facility] ICU to 2H @ [facility] via [facility]. Per 3rd shift report OG tube wasn't placed due to 4 unsuccessful attempts. Foly Inserted @ 0800 no urine return was noted. Report given to 2H by me and received by Brain Clark.

## 2018-04-22 NOTE — Progress Notes (Signed)
Approximately [phone] patient complained that he felt his sugar was low, patient's CBG 94 on check. Treated with 15 g of carbs as patient felt "low." At 0600 patient told wife that he did not feel right and needed to see the nurse. Patient slumped over in chair when this RN entered room. Staff moved patient to bed, patient bradycardic at that time. Rapid response activated. Patient lost pulse soon after, code blue activated. CPR performed for approximately 25-30 minutes, patient regained pulse, was intubated per wife's request. Patient transferred to ICU.

## 2018-04-22 NOTE — Procedures (Signed)
History: 77 year old male status post cardiac arrest  Sedation: Low-dose of propofol at the initiation, but subsequently none  Technique: This is a 21 channel 1 hour 9-minute EEG performed at the bedside with bipolar and monopolar montages arranged in accordance to the international 10/20 system of electrode placement. One channel was dedicated to EKG recording.    Background: The background is isoelectric with the exception of high voltage bursts of epileptiform-appearing activity lasting 1 to 2 seconds associated with clinical myoclonus.  These bursts are separated by 30-40 seconds before another burst.  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  Ritta Slot, MD [facility] [phone]  If 7pm- 7am, please page neurology on call as listed in [facility].

## 2018-04-22 NOTE — Consult Note (Addendum)
Consultation Note Date: 2018/04/03   Patient Name: Mario Walker  DOB: 1941-05-24  MRN: [id number]  Age / Sex: 77 y.o., male  PCP: Dairl Ponder, MD Referring Physician: Cristal Ford, DO  Reason for Consultation: Establishing goals of care  HPI/Patient Profile: 77 y.o. male  with past medical history of COPD, chronic diastolic heart failure, hypertension, type 2 diabetes, GERD, atrial fib with RVR, stage III chronic kidney disease, admitted on 03/17/2018 with A. fib with RVR.   Clinical Assessment and Goals of Care: Mr. Ringer is lying in bed intubated/ventilated.  Respiratory therapy is at bedside.  His wife is at bedside.  Mr. and Mrs. Holcomb have been married since September 24, and he was hospitalized September 25.  They have been dating for 2 to 2-1/2 years.  Each were married for approximately 70 years, but both lost their spouses.    Vaughan Basta and I talk about the plan for Mr. Ou to be transferred to the critical care medical team at the [facility] main campus.  She states understanding.  I share that our critical care transport team will assist in transportation.  She tells me that she has no questions at this time.  We briefly discussed the timeline.  Limited interactions today.  PMT will follow-up on Monday, 48 hours as requested by family.  Conference with hospitalist related to plan of care for transfer to critical care medicine team at [facility] main campus.  Hospitalist shares that family's goal is 48 hours for outcomes.  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 Conference with nursing staff related to patient needs.  Healthcare power of attorney NEXT OF KIN -wife, Eliasar Hlavaty.  Son Clell Trahan.   SUMMARY OF RECOMMENDATIONS   Full scope treatment. At least 48 hours for  outcomes.  Code Status/Advance Care Planning:  Full code  Symptom Management:   Per hospitalist, no additional needs at this time.  Palliative Prophylaxis:   Aspiration, Oral Care and Turn Reposition  Additional Recommendations (Limitations, Scope, Preferences):  Full Scope Treatment  Psycho-social/Spiritual:   Desire for further Chaplaincy support:no  Additional Recommendations: Caregiving  Support/Resources  Prognosis:   Unable to determine, based on outcomes.  2 weeks or less would not be surprising based on anoxic brain injury from 25 to 30 minutes of CPR.  Discharge Planning: Anticipated Hospital Death      Primary Diagnoses: Present on Admission: . Atrial fibrillation with RVR ([place]) . CKD (chronic kidney disease) stage 3, GFR 30-59 ml/min (HCC) . Essential hypertension . Elevated troponin . Acute on chronic diastolic CHF (congestive heart failure) ([place])   I have reviewed the medical record, interviewed the patient and family, and examined the patient. The following aspects are pertinent.  Past Medical History:  Diagnosis Date  . Atrial fibrillation with RVR ([place])   . Chronic diastolic CHF (congestive heart failure) ([place])   . Chronic kidney disease   .  Chronic respiratory failure with hypoxia and hypercapnia (HCC)   . COPD (chronic obstructive pulmonary disease) ([place])   . Cor pulmonale (chronic) ([place])   . Essential hypertension   . GERD (gastroesophageal reflux disease)   . Hyperlipidemia   . Type 2 diabetes mellitus ([place])    Social History   Socioeconomic History  . Marital status: Married    Spouse name: Not on file  . Number of children: Not on file  . Years of education: Not on file  . Highest education level: Not on file  Occupational History  . Not on file  Social Needs  . Financial resource strain: Not on file  . Food insecurity:    Worry: Not on file    Inability: Not on file  . Transportation needs:    Medical: Not on file     Non-medical: Not on file  Tobacco Use  . Smoking status: Former Smoker    Types: Cigarettes  . Smokeless tobacco: Never Used  Substance and Sexual Activity  . Alcohol use: No  . Drug use: No  . Sexual activity: Not on file  Lifestyle  . Physical activity:    Days per week: Not on file    Minutes per session: Not on file  . Stress: Not on file  Relationships  . Social connections:    Talks on phone: Not on file    Gets together: Not on file    Attends religious service: Not on file    Active member of club or organization: Not on file    Attends meetings of clubs or organizations: Not on file    Relationship status: Not on file  Other Topics Concern  . Not on file  Social History Narrative  . Not on file   Family History  Problem Relation Age of Onset  . Hypertension Father   . Heart attack Brother    Scheduled Meds: . apixaban  5 mg Oral BID  . atorvastatin  40 mg Oral QPM  . chlorhexidine gluconate (MEDLINE KIT)  15 mL Mouth Rinse BID  . DULoxetine  60 mg Oral Daily  . insulin aspart  0-15 Units Subcutaneous TID WC  . insulin aspart  0-5 Units Subcutaneous QHS  . insulin glargine  18 Units Subcutaneous QHS  . levalbuterol  0.63 mg Nebulization Q6H  . mouth rinse  15 mL Mouth Rinse 10 times per day  . multivitamin with minerals  1 tablet Oral Daily  . pantoprazole  80 mg Oral Daily  . vitamin C  500 mg Oral Daily   Continuous Infusions: . levETIRAcetam    . norepinephrine (LEVOPHED) Adult infusion 16 mcg/min (2018/04/22 1041)  . propofol (DIPRIVAN) infusion 15 mcg/kg/min (04/22/18 1041)   PRN Meds:.acetaminophen, albuterol, fentaNYL (SUBLIMAZE) injection, fentaNYL (SUBLIMAZE) injection, fluticasone, HYDROcodone-acetaminophen, nitroGLYCERIN, ondansetron (ZOFRAN) IV, zolpidem Medications Prior to Admission:  Prior to Admission medications   Medication Sig Start Date End Date Taking? Authorizing Provider  albuterol (PROVENTIL HFA;VENTOLIN HFA) 108 (90 Base) MCG/ACT  inhaler Inhale 2 puffs into the lungs every 6 (six) hours as needed for wheezing or shortness of breath. 03/08/18  Yes Tat, Shanon Brow, MD  apixaban (ELIQUIS) 5 MG TABS tablet Take 1 tablet (5 mg total) by mouth 2 (two) times daily. 03/08/18  Yes Tat, Shanon Brow, MD  atorvastatin (LIPITOR) 40 MG tablet Take 1 tablet by mouth every evening.    Yes [provider]  carvedilol (COREG) 25 MG tablet Take 25 mg by mouth 2 (two) times daily.  01/19/14  Yes [provider]  Cyanocobalamin (VITAMIN B-12 PO) Take 1 tablet by mouth daily.   Yes [provider]  diltiazem (CARDIZEM CD) 120 MG 24 hr capsule Take 1 capsule (120 mg total) by mouth daily. 03/08/18  Yes Tat, Shanon Brow, MD  DULoxetine (CYMBALTA) 60 MG capsule Take 60 mg by mouth daily.   Yes [provider]  fluticasone (FLONASE) 50 MCG/ACT nasal spray Place 1-2 sprays into both nostrils daily as needed for allergies or rhinitis.   Yes [provider]  furosemide (LASIX) 40 MG tablet Take 1 tablet (40 mg total) by mouth daily. 03/09/18  Yes Tat, Shanon Brow, MD  glimepiride (AMARYL) 2 MG tablet Take 1 tablet by mouth daily. 03/05/14  Yes [provider]  HYDROcodone-acetaminophen (NORCO) 7.5-325 MG tablet Take 1 tablet by mouth every 6 (six) hours as needed for moderate pain.   Yes [provider]  insulin aspart (NOVOLOG) 100 UNIT/ML injection Inject 5 Units into the skin 3 (three) times daily before meals.    Yes [provider]  ipratropium-albuterol (DUONEB) 0.5-2.5 (3) MG/3ML SOLN Inhale 3 mLs into the lungs 3 (three) times daily as needed (for shortness of breath/wheezing).  02/15/18  Yes [provider]  LEVEMIR FLEXTOUCH 100 UNIT/ML Pen Inject 35 Units into the skin at bedtime.  02/20/18  Yes [provider]  Multiple Vitamin (MULTIVITAMIN WITH MINERALS) TABS tablet Take 1 tablet by mouth daily.   Yes [provider]  omeprazole (PRILOSEC) 40 MG capsule Take 1 capsule by  mouth daily.  01/19/14  Yes [provider]  vitamin C (ASCORBIC ACID) 500 MG tablet Take 500 mg by mouth daily.   Yes [provider]  predniSONE (DELTASONE) 20 MG tablet Take 2 tablets (40 mg total) by mouth daily with breakfast. X 3 days. Then 1 tablet (20 mg) daily x 3 days Patient not taking: Reported on 03/12/2018 03/09/18   Orson Eva, MD  sildenafil (REVATIO) 20 MG tablet Take 1 tablet (20 mg total) by mouth 3 (three) times daily. 03/08/18   Orson Eva, MD   No Known Allergies Review of Systems  Unable to perform ROS: Intubated    Physical Exam  Constitutional:  Intubated/ventilated, critically ill  HENT:  Head: Atraumatic.  Cardiovascular: Normal rate.  Pulmonary/Chest:  Intubated/ventilated  Abdominal: Soft. He exhibits distension.  Musculoskeletal: He exhibits no edema.  Neurological:  Intubated/ventilated  Skin: Skin is warm and dry.  Nursing note and vitals reviewed.   Vital Signs: BP 99/60   Pulse (!) 101   Temp 98.3 F (36.8 C) (Oral)   Resp (!) 30   Ht _0  (1.676 m)   Wt 95.8 kg   SpO2 99%   BMI 34.09 kg/m  Pain Scale: 0-10 POSS *See Group Information*: 1-Acceptable,Awake and alert Pain Score: 4    SpO2: SpO2: 99 % O2 Device:SpO2: 99 % O2 Flow Rate: .O2 Flow Rate (L/min): 6 L/min  IO: Intake/output summary:   Intake/Output Summary (Last 24 hours) at 04/09/18 1107 Last data filed at 04/09/2018 1041 Gross per 24 hour  Intake 1057.52 ml  Output 125 ml  Net 932.52 ml    LBM: Last BM Date: 03/24/18 Baseline Weight: Weight: 89.7 kg Most recent weight: Weight: 95.8 kg     Palliative Assessment/Data:   Flowsheet Rows     Most Recent Value  Intake Tab  Referral Department  Hospitalist  Unit at Time of Referral  ICU  Palliative Care Primary Diagnosis  Cardiac  Date Notified  Apr 12, 2018  Palliative Care Type  New Palliative care  Reason for referral  Clarify Goals of Care  Date of Admission  02/27/2018  Date first seen by  Palliative Care  Apr 12, 2018  # of days Palliative referral response time  0 Day(s)  # of days IP prior to Palliative referral  9  Clinical Assessment  Palliative Performance Scale Score  10%  Pain Max last 24 hours  Not able to report  Pain Min Last 24 hours  Not able to report  Dyspnea Max Last 24 Hours  Not able to report  Dyspnea Min Last 24 hours  Not able to report  Psychosocial & Spiritual Assessment  Palliative Care Outcomes  Patient/Family meeting held?  Yes  Who was at the meeting?  wife at bedside      Time In: 0910 Time Out: 0940 Time Total: 30 minutes Greater than 50%  of this time was spent counseling and coordinating care related to the above assessment and plan.  Signed by: Drue Novel, NP   Please contact Palliative Medicine Team phone at [phone] for questions and concerns.  For individual provider: See Shea Evans

## 2018-04-22 NOTE — Progress Notes (Signed)
LTM EEG D/C'd per Dr Amada Jupiter. He will read it as a prolonged EEG

## 2018-04-22 NOTE — Progress Notes (Signed)
PROGRESS NOTE    Mario Walker  VOZ:[id number] DOB: 02/14/1941 DOA: 03/21/2018 PCP: Mario Ponder, MD   Brief Narrative:  HPI on 02/23/2018 by Mario Walker Mario Walker is a 77 y.o. male with medical history significant of *COPD, chronic respiratory failure, A. fib, type 2 diabetes, presents with tachycardia.  He went to Mario Walker office today for follow-up visit.  He was found to be tachycardic.  He was sent over to the ED.  He was found to be in RVR. States he also has some dyspnea on exertion when his palpitations occur.  Patient denies any chest pain.  Was admitted on September 13 for CHF and new onset A. fib.  He was discharged on p.o. Cardizem and Eliquis.  He he states compliance with his medications.    Interim history Patient admitted for paroxysmal atrial fibrillation with RVR and was placed on Cardizem drip.  He then was found to have acute on chronic respiratory failure which has been improving.  So has acute on chronic heart failure, cardiology consulted and appreciated.  Overnight, patient began to have chest pain. EKG and troponin obtained- no changes in EKG. Troponin 0.21 (lower than earlier this admission). Patient then became unresponsive with PEA and asystole. CPR was started, and continued for 25-30 minutes. Although patient is a DNI, patient's wife insisted on intubation. Currently on propofol and levophed. Patient began to have seizure activity, loaded with keppra.  Patient will be transferred to [facility]. Of note, neurology, Mario Walker has been consulted and will see the patient upon arrival.   Assessment & Plan   Cardiopulmonary arrest -As above, patient began to have chest pain at approximately 9 PM on 03/24/2018.  EKG and troponin were obtained.  EKG did not show any signs of ischemia.  Troponin was 0.21 which is down from 0.63 earlier this admission.   -Patient then became unresponsive and was found to have PEA asystole.  CPR was initiated and  lasted approximately 25-30 minutes.   -Patient is a DNI however wife insisted that he be intubated.  Currently on Levophed and propofol. -Discussed with cardiology, Mario Walker, suspect that arrest is secondary to pulmonary etiology given that EKG currently shows sinus rhythm, troponin has been flat status post resuscitation. -Of note, patient has been on Eliquis, therefore PE unlikely.  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  Acute renal failure on chronic kidney disease, stage III -Renal ultrasound shows no significant findings -Suspect secondary to hypotension due to Cardizem -Creatinine 2.42 (up from 1.38)- likely worsend due to hypotension, currently on pressors -Continue to monitor BMP  Goals of care -had discussion with family regarding code status and prognosis -Currently they would like everything done -patient will transferred to [facility]  Paroxysmal atrial fibrillation with rapid ventricular rate -Patient required Cardizem drip upon admission however has now been transitioned to oral Cardizem as well as metoprolol -Cardiology was consulted and appreciated he was placed on oral Cardizem and Eliquis. -Currently he is in normal sinus rhythm after cardiopulmonary arrest.  Acute on chronic diastolic congestive heart failure -Possibly triggered by atrial fibrillation with RVR -Echocardiogram February 07, 2018 showed an EF of 55 to 60%, no regional wall motion abnormalities.  Findings  consistent with LV diastolic dysfunction, grade intermediate. -Cardiology consulted and appreciated -Chest x-ray showed worsening airspace disease likely CHF, BNP was also noted to  be increased -Was placed on torsemide.  Acute on chronic respiratory failure with hypoxia -Patient uses 5 to 6 L of home oxygen at home -Currently he has been weaned to his home amount and is in no respiratory distress -Previous hospitalist discussed with Mario Walker, pulmonology, who feels concentrator will be needed and will follow up with patient in pulmonology outpatient setting  Essential hypertension -Was hypotensive after code, currently on levophed  Diabetes mellitus, type II -Complicated by episodes of hypoglycemia -Lantus dose was reduced, currently on insulin sliding scale  Severe pulmonary hypertension -Patient to follow-up with pulmonology as an outpatient -Cardiology also recommending outpatient VQ scan as well as a right heart cardiac catheterization  Mild hyponatremia -resolved, suspect secondary to diuretics -Continue monitor BMP  Elevated troponin -Troponin peaked to 0.63 -Suspect secondary to tachycardia as well as hypoxia leading to demand ischemia -As above, cardiology has been following.  Currently he is not a candidate for invasive testing.  Physical deconditioning  -PT recommend HH (prior to code) -Patient refused [facility] services   DVT Prophylaxis  Eliquis  Code Status: Partial Code, DNI (however currently intubated at the request of wife)  Family Communication: Family at bedside  Disposition Plan: Admitted. Transferring to [facility].  Consultants Cardiology Pulmonology (Mario Walker), PCCM (at [facility]) Neurology  Procedures  None  Antibiotics   Anti-infectives (From admission, onward)   None      Subjective:   Mario Walker seen and examined today.  Currently intubated.   Objective:   Vitals:   04-21-2018 0514 04/21/18 0910 21-Apr-2018 0951 Apr 21, 2018 0952  BP: 138/72 112/67    Pulse: 98     Resp:      Temp: 98.3 F (36.8 C)     TempSrc: Oral     SpO2: 93%  100% 100%  Weight: 95.8 kg     Height:        Intake/Output Summary (Last 24 hours) at  04/21/18 1016 Last data filed at 04-21-2018 0959 Gross per 24 hour  Intake 1013.53 ml  Output 125 ml  Net 888.53 ml   Filed Weights   03/23/18 0500 03/24/18 0400 04/21/2018 0514  Weight: 94.7 kg 93.8 kg 95.8 kg   Exam  General: Well developed, well nourished, actively seizing   HEENT: NCAT, ET tube in place  Neck: Supple, no JVD  Cardiovascular: S1 S2 auscultated, no murmur, RRR.  Respiratory: Coarse breath sounds B/L, on vent.  Abdomen: Soft, obese, nontender, nondistended, + bowel sounds  Extremities: warm dry without cyanosis clubbing or edema  Neuro: cannot assess; active seizing   Data Reviewed: I have personally reviewed following labs and imaging studies  CBC: Recent Labs  Lab 03/21/18 0354 03/22/18 0423 03/23/18 0351 03/24/18 0406 April 21, 2018 0241 2018/04/21 0734  WBC 10.9* 10.7* 9.8 11.2* 8.7 19.9*  NEUTROABS 7.9* 7.9* 6.6 9.4* 7.0  --   HGB 9.7* 10.1* 9.5* 10.0* 9.4* 9.7*  HCT 32.1* 32.8* 31.9* 33.5* 31.4* 32.4*  MCV 89.7 89.4 91.1 91.3 90.2 92.0  PLT 278 275 244 261 192 937   Basic Metabolic Panel: Recent Labs  Lab 03/19/18 0431  03/22/18 0423 03/23/18 0351 03/24/18 0406 04/21/18 0241 21-Apr-2018 0734  NA 133*   < > 137 137 137 134* 136  K 4.1   < > 3.7 4.0 4.7 4.7 5.0  CL 95*   < > 99 96*  95* 93* 94*  CO2 27   < > 31 37* 33* 33* 29  GLUCOSE 109*   < > 75 105* 196* 130* 154*  BUN 40*   < > 29* 30* 28* 31* 33*  CREATININE 1.86*   < > 1.42* 1.58* 1.38* 2.10* 2.42*  CALCIUM 8.7*   < > 8.4* 8.6* 8.7* 8.5* 8.1*  MG 1.9  --  2.0  --   --   --   --    < > = values in this interval not displayed.   GFR: Estimated Creatinine Clearance: 27.3 mL/min (A) (by C-G formula based on SCr of 2.42 mg/dL (H)). Liver Function Tests: Recent Labs  Lab 2018/04/18 0734  AST 29  ALT 20  ALKPHOS 56  BILITOT 1.2  PROT 6.4*  ALBUMIN 2.7*   No results for input(s): LIPASE, AMYLASE in the last 168 hours. No results for input(s): AMMONIA in the last 168  hours. Coagulation Profile: No results for input(s): INR, PROTIME in the last 168 hours. Cardiac Enzymes: Recent Labs  Lab 03/24/18 2112 04/18/2018 0241 04/18/18 0850  TROPONINI 0.21* 0.21* 0.22*   BNP (last 3 results) No results for input(s): PROBNP in the last 8760 hours. HbA1C: No results for input(s): HGBA1C in the last 72 hours. CBG: Recent Labs  Lab 03/24/18 2007 03/24/18 2145 04/18/2018 0545 Apr 18, 2018 0634 18-Apr-2018 0904  GLUCAP 183* 205* 94 112* 106*   Lipid Profile: Recent Labs    2018/04/18 0734 18-Apr-2018 0850  TRIG 65 69   Thyroid Function Tests: No results for input(s): TSH, T4TOTAL, FREET4, T3FREE, THYROIDAB in the last 72 hours. Anemia Panel: No results for input(s): VITAMINB12, FOLATE, FERRITIN, TIBC, IRON, RETICCTPCT in the last 72 hours. Urine analysis:    Component Value Date/Time   COLORURINE YELLOW 03/18/2018 0901   APPEARANCEUR CLEAR 03/18/2018 0901   LABSPEC 1.016 03/18/2018 0901   PHURINE 5.0 03/18/2018 0901   GLUCOSEU 50 (A) 03/18/2018 0901   HGBUR NEGATIVE 03/18/2018 0901   BILIRUBINUR NEGATIVE 03/18/2018 0901   KETONESUR NEGATIVE 03/18/2018 0901   PROTEINUR NEGATIVE 03/18/2018 0901   NITRITE NEGATIVE 03/18/2018 0901   LEUKOCYTESUR TRACE (A) 03/18/2018 0901   Sepsis Labs: _0 (procalcitonin:4,lacticidven:4)  ) Recent Results (from the past 240 hour(s))  Culture, Urine     Status: None   Collection Time: 03/18/18  9:01 AM  Result Value Ref Range Status   Specimen Description   Final    URINE, RANDOM Performed at [facility], [address] [place], [place] [address]    Special Requests   Final    NONE Performed at [facility], [address], [place], [place] [address]    Culture   Final    Multiple bacterial morphotypes present, none predominant. Suggest appropriate recollection if clinically indicated.   Report Status 03/19/2018 FINAL  Final      Radiology Studies: Dg Chest Port 1 View  Result Date:  04/18/18 CLINICAL DATA:  Post CPR. EXAM: PORTABLE CHEST 1 VIEW COMPARISON:  Radiograph 03/21/2018 FINDINGS: Endotracheal tube tip at the thoracic inlet 2.5 cm from the carina. Cardiomegaly. Bilateral diffuse airspace disease favoring pulmonary edema, grossly similar to prior. Suspected small effusions. No pneumothorax. IMPRESSION: 1. Endotracheal tube tip at the thoracic inlet. 2. Cardiomegaly, diffuse airspace disease favoring pulmonary edema, suspected pleural effusions consistent with CHF, grossly unchanged from prior exam. Electronically Signed   By: Keith Rake M.D.   On: 2018/04/18 06:53     Scheduled Meds: . apixaban  5 mg Oral BID  .  atorvastatin  40 mg Oral QPM  . chlorhexidine gluconate (MEDLINE KIT)  15 mL Mouth Rinse BID  . DULoxetine  60 mg Oral Daily  . furosemide  40 mg Intravenous Once  . insulin aspart  0-15 Units Subcutaneous TID WC  . insulin aspart  0-5 Units Subcutaneous QHS  . insulin glargine  18 Units Subcutaneous QHS  . levalbuterol  0.63 mg Nebulization Q6H  . mouth rinse  15 mL Mouth Rinse 10 times per day  . multivitamin with minerals  1 tablet Oral Daily  . pantoprazole  80 mg Oral Daily  . vitamin C  500 mg Oral Daily   Continuous Infusions: . levETIRAcetam    . norepinephrine (LEVOPHED) Adult infusion 15 mcg/min (Apr 22, 2018 0959)  . propofol (DIPRIVAN) infusion 5 mcg/kg/min (Apr 22, 2018 0959)     LOS: 7 days   Total critical care time: 60 minutes (greater than 50% of time spent with patient face to face, calling consults, and formulating a plan)   Cristal Ford D.O. on April 22, 2018 at 10:16 AM  Between 7am to 7pm - Please see pager noted on [email or web address]  After 7pm go to [email or web address]  And look for the night coverage person covering for me after hours  [facility] Office  [phone]

## 2018-04-22 NOTE — Death Summary Note (Signed)
DEATH SUMMARY   Patient Details  Name: Mario Walker MRN: [id number] DOB: 08-13-1940  Admission/Discharge Information   Admit Date:  03-27-2018  Date of Death: Date of Death: 2018/04/05  Time of Death: Time of Death: Nov 07, 1724  Length of Stay: 7  Referring Physician: Craig Staggers, MD   Reason(s) for Hospitalization  Acute respiratory failure  Diagnoses  Preliminary cause of death:   Pulseless electrical activity cardiac arrest  Secondary Diagnoses (including complications and co-morbidities):  Principal Problem:   Atrial fibrillation with RVR (HCC) Active Problems:   CKD (chronic kidney disease) stage 3, GFR 30-59 ml/min (HCC)   Essential hypertension   Diabetes mellitus without complication (HCC)   Elevated troponin   Acute on chronic diastolic CHF (congestive heart failure) (HCC)   Chronic respiratory failure with hypoxia (HCC)   COPD (chronic obstructive pulmonary disease) (HCC)   Chronic diastolic CHF (congestive heart failure) (HCC)   Cor pulmonale (chronic) (HCC)   Atrial fibrillation with rapid ventricular response (HCC)   Acute on chronic respiratory failure with hypoxia (HCC)   Cardiac arrest (HCC)   Goals of care, counseling/discussion   Palliative care by specialist   Cardiac arrest, cause unspecified (HCC)   Anoxic encephalopathy (HCC)   Palliative care encounter   AKI (acute kidney injury) (HCC)   Anoxic brain injury (HCC)   Cardiogenic shock [facility])   Brief Hospital Course (including significant findings, care, treatment, and services provided and events leading to death)  [name] a 77 y.o.malewith a hx of 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 He was admitted to [facility] 2018/03/27 for a-fib with RVR. He was  started on a cardizem gtt. He was found to have acute on chronic respiratory failure which had been improving. He also has acute on chronic heart failure. The night of 10/3 at 9 pm  the patient complained of chest pain. EKG and troponin obtained- no changes in EKG. Troponin 0.21 (lower than earlier this admission). Patient then became unresponsive with PEA and asystole. CPR was started, and continued for 25-30 minutes. Although patient was  a DNI, patient's wife insisted on intubation. Currently on propofol and levophed. Patient began to have seizure activity, loaded with keppra. Patient will be transferred to [facility]. Of note, neurology, Dr. Amada Jupiter has been consulted and will see the patient upon arrival. PCCM will admit and manage care.   Will not cool patient, outside window.  EEG ordered.  Neurology consult called.  Will not order MRI at this point unless neurology feels necessary.  Levophed for BP support.  D/C propofol until seen by neurology.  Spoke with family extensively, informed them that patient is critically ill and has evidence of myoclonus which usually indicates anoxic injury.  After a long discussion, they asked that we no longer do anything to the patient that would hurt him, refused procedures and requested comfort until the rest of the family arrives.  Will make a full DNR with no further escalation of care.  Leave withdrawal orders on the chart to be released by RN once family arrives.  Family returned and decided on comfort measures, morphine was started and patient was extubated to expire shortly thereafter with the family bedside.     Pertinent Labs and Studies  Significant Diagnostic Studies US Renal  Result Date: 03/20/2018 CLINICAL DATA:  Acute  renal injury EXAM: RENAL / URINARY TRACT ULTRASOUND COMPLETE COMPARISON:  CT 02/06/2018 FINDINGS: Right Kidney: Length: 10.7 cm. Echogenicity within normal limits. No mass or hydronephrosis visualized. Left Kidney: Length:  10.4 cm. Echogenicity within normal limits. No mass or hydronephrosis visualized. Mild renal cortical thinning. Bladder: Appears normal for degree of bladder distention. IMPRESSION: 1. No hydronephrosis. 2. Renal cortical thinning more prominent on the LEFT. Electronically Signed   By: Genevive Bi M.D.   On: 03/20/2018 14:26   Dg Chest Port 1 View  Result Date: 03/24/2018 CLINICAL DATA:  Post CPR. EXAM: PORTABLE CHEST 1 VIEW COMPARISON:  Radiograph 03/21/2018 FINDINGS: Endotracheal tube tip at the thoracic inlet 2.5 cm from the carina. Cardiomegaly. Bilateral diffuse airspace disease favoring pulmonary edema, grossly similar to prior. Suspected small effusions. No pneumothorax. IMPRESSION: 1. Endotracheal tube tip at the thoracic inlet. 2. Cardiomegaly, diffuse airspace disease favoring pulmonary edema, suspected pleural effusions consistent with CHF, grossly unchanged from prior exam. Electronically Signed   By: Narda Rutherford M.D.   On: 03/24/2018 06:53   Dg Chest Port 1 View  Result Date: 03/21/2018 CLINICAL DATA:  COPD, CHF EXAM: PORTABLE CHEST 1 VIEW COMPARISON:  03/18/2018 FINDINGS: Cardiomegaly with vascular congestion. Worsening bilateral airspace opacities, likely worsening edema. Suspect small layering effusions. No acute bony abnormality. IMPRESSION: Worsening bilateral airspace disease, likely worsening CHF. Small effusions. Electronically Signed   By: Charlett Nose M.D.   On: 03/21/2018 07:23   Dg Chest Port 1 View  Result Date: 03/18/2018 CLINICAL DATA:  Shortness of breath. EXAM: PORTABLE CHEST 1 VIEW COMPARISON:  Mar 23, 2018. FINDINGS: Cardiomegaly with diffuse severe bilateral pulmonary interstitial prominence and bilateral pleural effusions. Findings consistent with CHF. No pneumothorax. IMPRESSION: Congestive heart failure with prominent bilateral pulmonary interstitial edema and bilateral pleural effusions. Electronically Signed   By: Maisie Fus  Register   On: 03/18/2018 10:34    Dg Chest Port 1 View  Result Date: 03-23-2018 CLINICAL DATA:  Shortness of breath for 2 weeks, tachycardia for 1 week. History of COPD and CHF. EXAM: PORTABLE CHEST 1 VIEW COMPARISON:  Chest radiograph March 04, 2018 FINDINGS: Stable at least moderate cardiomegaly, partially obscured by diffuse interstitial prominence, alveolar airspace opacities. Small pleural effusions. Mediastinal silhouette is not suspicious, calcified aortic arch. Fullness of the pulmonary hila unchanged. No pneumothorax. Suture anchors bilateral humeral heads. IMPRESSION: Stable cardiomegaly. Diffuse interstitial prominence, potentially chronic. Similar bibasilar airspace opacities and small pleural effusions. Aortic Atherosclerosis (ICD10-I70.0). Electronically Signed   By: Awilda Metro M.D.   On: 03/23/2018 18:28    Microbiology No results found for this or any previous visit (from the past 240 hour(s)).  Lab Basic Metabolic Panel: No results for input(s): NA, K, CL, CO2, GLUCOSE, BUN, CREATININE, CALCIUM, MG, PHOS in the last 168 hours. Liver Function Tests: No results for input(s): AST, ALT, ALKPHOS, BILITOT, PROT, ALBUMIN in the last 168 hours. No results for input(s): LIPASE, AMYLASE in the last 168 hours. No results for input(s): AMMONIA in the last 168 hours. CBC: No results for input(s): WBC, NEUTROABS, HGB, HCT, MCV, PLT in the last 168 hours. Cardiac Enzymes: No results for input(s): CKTOTAL, CKMB, CKMBINDEX, TROPONINI in the last 168 hours. Sepsis Labs: No results for input(s): PROCALCITON, WBC, LATICACIDVEN in the last 168 hours.  Procedures/Operations     Syriah Delisi 04/05/2018, 4:47 PM

## 2018-04-22 NOTE — Consult Note (Signed)
Consult requested by: [facility] hospitalist, Dr.Mikhail Consult requested for: Respiratory failure  HPI: This is a 77 year old who has multiple medical problems and who came to the hospital with atrial fib with rapid ventricular response.  He is been treated and improved but earlier this morning he told his wife he did not feel right felt like his blood sugar might be low although it was not and eventually had bradycardia and cardiopulmonary arrest.  He had CODE BLUE called CPR was performed for 20 to 25 minutes he received multiple medications regained his pulse intubated and transferred to ICU.  At baseline he has some element of pulmonary fibrosis.  He also has COPD.  Past Medical History:  Diagnosis Date  . Atrial fibrillation with RVR ([place])   . Chronic diastolic CHF (congestive heart failure) ([place])   . Chronic kidney disease   . Chronic respiratory failure with hypoxia and hypercapnia (HCC)   . COPD (chronic obstructive pulmonary disease) ([place])   . Cor pulmonale (chronic) ([place])   . Essential hypertension   . GERD (gastroesophageal reflux disease)   . Hyperlipidemia   . Type 2 diabetes mellitus (HCC)      Family History  Problem Relation Age of Onset  . Hypertension Father   . Heart attack Brother      Social History   Socioeconomic History  . Marital status: Married    Spouse name: Not on file  . Number of children: Not on file  . Years of education: Not on file  . Highest education level: Not on file  Occupational History  . Not on file  Social Needs  . Financial resource strain: Not on file  . Food insecurity:    Worry: Not on file    Inability: Not on file  . Transportation needs:    Medical: Not on file    Non-medical: Not on file  Tobacco Use  . Smoking status: Former Smoker    Types: Cigarettes  . Smokeless tobacco: Never Used  Substance and Sexual Activity  . Alcohol use: No  . Drug use: No  . Sexual activity: Not on file  Lifestyle  . Physical activity:     Days per week: Not on file    Minutes per session: Not on file  . Stress: Not on file  Relationships  . Social connections:    Talks on phone: Not on file    Gets together: Not on file    Attends religious service: Not on file    Active member of club or organization: Not on file    Attends meetings of clubs or organizations: Not on file    Relationship status: Not on file  Other Topics Concern  . Not on file  Social History Narrative  . Not on file     ROS: Not obtainable    Objective: Vital signs in last 24 hours: Temp:  [97.4 F (36.3 C)-98.3 F (36.8 C)] 98.3 F (36.8 C) (10/04 0514) Pulse Rate:  [75-98] 98 (10/04 0514) Resp:  [20-21] 21 (10/03 2044) BP: (100-138)/(61-73) 138/72 (10/04 0514) SpO2:  [90 %-95 %] 93 % (10/04 0514) FiO2 (%):  [100 %] 100 % (10/04 0656) Weight:  [95.8 kg] 95.8 kg (10/04 0514) Weight change: 2 kg Last BM Date: 03/24/18  Intake/Output from previous day: 10/03 0701 - 10/04 0700 In: 1000 [P.O.:1000] Out: 125 [Urine:125]  PHYSICAL EXAM Constitutional: Intubated sedated on mechanical ventilation.  Eyes: Pupils react.  Ears nose mouth and throat: Endotracheal tube in place.  Mucous membranes are moist.  Cardiovascular: He appears to be in sinus rhythm heart sounds are present but somewhat diminished.  Respiratory: He is intubated on the ventilator.  He has bilateral rhonchi and wheezing.  Gastrointestinal: His abdomen is soft obese protuberant without masses.  Musculoskeletal: Cannot assess.  Neurological: He is been having seizure activity with twitching of his arms and legs and face.  Psychiatric: Unable to assess  Lab Results: Basic Metabolic Panel: Recent Labs    04-04-2018 0241 04-04-2018 0734  NA 134* 136  K 4.7 5.0  CL 93* 94*  CO2 33* 29  GLUCOSE 130* 154*  BUN 31* 33*  CREATININE 2.10* 2.42*  CALCIUM 8.5* 8.1*   Liver Function Tests: Recent Labs    April 04, 2018 0734  AST 29  ALT 20  ALKPHOS 56  BILITOT 1.2  PROT 6.4*   ALBUMIN 2.7*   No results for input(s): LIPASE, AMYLASE in the last 72 hours. No results for input(s): AMMONIA in the last 72 hours. CBC: Recent Labs    03/24/18 0406 2018-04-04 0241 2018-04-04 0734  WBC 11.2* 8.7 19.9*  NEUTROABS 9.4* 7.0  --   HGB 10.0* 9.4* 9.7*  HCT 33.5* 31.4* 32.4*  MCV 91.3 90.2 92.0  PLT 261 192 207   Cardiac Enzymes: Recent Labs    03/24/18 2112 04-Apr-2018 0241  TROPONINI 0.21* 0.21*   BNP: No results for input(s): PROBNP in the last 72 hours. D-Dimer: No results for input(s): DDIMER in the last 72 hours. CBG: Recent Labs    03/24/18 1804 03/24/18 1828 03/24/18 2007 03/24/18 2145 04/04/2018 0545 2018-04-04 0634  GLUCAP 162* 147* 183* 205* 94 112*   Hemoglobin A1C: No results for input(s): HGBA1C in the last 72 hours. Fasting Lipid Panel: Recent Labs    Apr 04, 2018 0734  TRIG 65   Thyroid Function Tests: No results for input(s): TSH, T4TOTAL, FREET4, T3FREE, THYROIDAB in the last 72 hours. Anemia Panel: No results for input(s): VITAMINB12, FOLATE, FERRITIN, TIBC, IRON, RETICCTPCT in the last 72 hours. Coagulation: No results for input(s): LABPROT, INR in the last 72 hours. Urine Drug Screen: Drugs of Abuse  No results found for: LABOPIA, COCAINSCRNUR, LABBENZ, AMPHETMU, THCU, LABBARB  Alcohol Level: No results for input(s): ETH in the last 72 hours. Urinalysis: No results for input(s): COLORURINE, LABSPEC, PHURINE, GLUCOSEU, HGBUR, BILIRUBINUR, KETONESUR, PROTEINUR, UROBILINOGEN, NITRITE, LEUKOCYTESUR in the last 72 hours.  Invalid input(s): APPERANCEUR Misc. Labs:   ABGS: No results for input(s): PHART, PO2ART, TCO2, HCO3 in the last 72 hours.  Invalid input(s): PCO2   MICROBIOLOGY: Recent Results (from the past 240 hour(s))  Culture, Urine     Status: None   Collection Time: 03/18/18  9:01 AM  Result Value Ref Range Status   Specimen Description   Final    URINE, RANDOM Performed at [facility], [address],  [place], [place] [address]    Special Requests   Final    NONE Performed at [facility], [address] [place], [place] [address]    Culture   Final    Multiple bacterial morphotypes present, none predominant. Suggest appropriate recollection if clinically indicated.   Report Status 03/19/2018 FINAL  Final    Studies/Results: Dg Chest Port 1 View  Result Date: 04-Apr-2018 CLINICAL DATA:  Post CPR. EXAM: PORTABLE CHEST 1 VIEW COMPARISON:  Radiograph 03/21/2018 FINDINGS: Endotracheal tube tip at the thoracic inlet 2.5 cm from the carina. Cardiomegaly. Bilateral diffuse airspace disease favoring pulmonary edema, grossly similar to prior. Suspected small effusions.  No pneumothorax. IMPRESSION: 1. Endotracheal tube tip at the thoracic inlet. 2. Cardiomegaly, diffuse airspace disease favoring pulmonary edema, suspected pleural effusions consistent with CHF, grossly unchanged from prior exam. Electronically Signed   By: Keith Rake M.D.   On: Apr 22, 2018 06:53    Medications:  Prior to Admission:  Medications Prior to Admission  Medication Sig Dispense Refill Last Dose  . albuterol (PROVENTIL HFA;VENTOLIN HFA) 108 (90 Base) MCG/ACT inhaler Inhale 2 puffs into the lungs every 6 (six) hours as needed for wheezing or shortness of breath. 1 Inhaler 1 Past Week at Unknown time  . apixaban (ELIQUIS) 5 MG TABS tablet Take 1 tablet (5 mg total) by mouth 2 (two) times daily. 60 tablet 0 02/21/2018 at 930a  . atorvastatin (LIPITOR) 40 MG tablet Take 1 tablet by mouth every evening.    03/17/2018 at Unknown time  . carvedilol (COREG) 25 MG tablet Take 25 mg by mouth 2 (two) times daily.    03/07/2018 at 930a  . Cyanocobalamin (VITAMIN B-12 PO) Take 1 tablet by mouth daily.   03/17/2018 at Unknown time  . diltiazem (CARDIZEM CD) 120 MG 24 hr capsule Take 1 capsule (120 mg total) by mouth daily. 30 capsule 2 03/14/2018 at Unknown time  . DULoxetine (CYMBALTA) 60 MG capsule Take 60 mg by mouth daily.   03/14/2018 at  Unknown time  . fluticasone (FLONASE) 50 MCG/ACT nasal spray Place 1-2 sprays into both nostrils daily as needed for allergies or rhinitis.   Past Week at Unknown time  . furosemide (LASIX) 40 MG tablet Take 1 tablet (40 mg total) by mouth daily. 30 tablet 0 02/24/2018 at Unknown time  . glimepiride (AMARYL) 2 MG tablet Take 1 tablet by mouth daily.   03/19/2018 at Unknown time  . HYDROcodone-acetaminophen (NORCO) 7.5-325 MG tablet Take 1 tablet by mouth every 6 (six) hours as needed for moderate pain.   03/13/2018 at 1200  . insulin aspart (NOVOLOG) 100 UNIT/ML injection Inject 5 Units into the skin 3 (three) times daily before meals.    03/14/2018 at Unknown time  . ipratropium-albuterol (DUONEB) 0.5-2.5 (3) MG/3ML SOLN Inhale 3 mLs into the lungs 3 (three) times daily as needed (for shortness of breath/wheezing).   1 02/21/2018 at Unknown time  . LEVEMIR FLEXTOUCH 100 UNIT/ML Pen Inject 35 Units into the skin at bedtime.   3 03/15/2018 at Unknown time  . Multiple Vitamin (MULTIVITAMIN WITH MINERALS) TABS tablet Take 1 tablet by mouth daily.   03/09/2018 at Unknown time  . omeprazole (PRILOSEC) 40 MG capsule Take 1 capsule by mouth daily.    03/18/2018 at Unknown time  . vitamin C (ASCORBIC ACID) 500 MG tablet Take 500 mg by mouth daily.   03/19/2018 at Unknown time  . predniSONE (DELTASONE) 20 MG tablet Take 2 tablets (40 mg total) by mouth daily with breakfast. X 3 days. Then 1 tablet (20 mg) daily x 3 days (Patient not taking: Reported on 03/15/2018) 9 tablet 0 Not Taking at Unknown time  . sildenafil (REVATIO) 20 MG tablet Take 1 tablet (20 mg total) by mouth 3 (three) times daily. 90 tablet 0    Scheduled: . apixaban  5 mg Oral BID  . atorvastatin  40 mg Oral QPM  . carvedilol  25 mg Oral BID  . chlorhexidine gluconate (MEDLINE KIT)  15 mL Mouth Rinse BID  . diltiazem  180 mg Oral Daily  . DULoxetine  60 mg Oral Daily  . furosemide  40 mg Intravenous  Once  . insulin aspart  0-15 Units Subcutaneous  TID WC  . insulin aspart  0-5 Units Subcutaneous QHS  . insulin glargine  18 Units Subcutaneous QHS  . levalbuterol  0.63 mg Nebulization Q6H  . mouth rinse  15 mL Mouth Rinse 10 times per day  . multivitamin with minerals  1 tablet Oral Daily  . pantoprazole  80 mg Oral Daily  . torsemide  40 mg Oral BID  . vitamin C  500 mg Oral Daily   Continuous: . levETIRAcetam    . norepinephrine (LEVOPHED) Adult infusion 11 mcg/min (March 29, 2018 0801)  . propofol (DIPRIVAN) infusion 5 mcg/kg/min (March 29, 2018 0802)   BUY:[id number], albuterol, fentaNYL (SUBLIMAZE) injection, fentaNYL (SUBLIMAZE) injection, fluticasone, HYDROcodone-acetaminophen, nitroGLYCERIN, ondansetron (ZOFRAN) IV, zolpidem  Assesment: He is had cardiopulmonary arrest.  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  He has chronic hypoxic respiratory failure had been on 6 L oxygen at home with plans to increase his flow and give him a high flow oxygen concentrator because the 6 L did not adequately oxygenate him  He has had acute on chronic diastolic heart failure which is being treated  He had atrial fib with rapid ventricular response and surprisingly did not go back into atrial fibrillation his resuscitation Principal Problem:   Atrial fibrillation with RVR (HCC) Active Problems:   CKD (chronic kidney disease) stage 3, GFR 30-59 ml/min (HCC)   Essential hypertension   Diabetes mellitus without complication (HCC)   Elevated troponin   Acute on chronic diastolic CHF (congestive heart failure) (HCC)   Chronic respiratory failure with hypoxia (HCC)   COPD (chronic obstructive pulmonary disease) (HCC)   Chronic diastolic CHF (congestive heart failure) (HCC)   Cor pulmonale (chronic) (HCC)   Atrial fibrillation with rapid ventricular response (HCC)   Acute on chronic respiratory failure with hypoxia ([place])    Plan: Long discussion with multiple family members.   He had previously had DNI status.  They feel strongly that he would like to be on the ventilator briefly at least to see if he can recover.    LOS: 7 days   Chelci [place] L 03/29/18, 8:45 AM

## 2018-04-22 NOTE — Consult Note (Signed)
Neurology Consultation Reason for Consult: Concern for seizures Referring Physician: Nunzio Cory  CC: Concern for seizures  History is obtained from: Family  HPI: Mario Walker is a 77 y.o. male with a history of A. fib who presented to [facility] with rapid A. Fib.  He unfortunately suffered a cardiac arrest in the wee hours in the morning and has subsequently developed myoclonic activity.  For this, he was transferred from an open hospital to [facility] for further evaluation/prognostication.     ROS:  Unable to obtain due to altered mental status.   Past Medical History:  Diagnosis Date  . Atrial fibrillation with RVR (HCC)   . Chronic diastolic CHF (congestive heart failure) (HCC)   . Chronic kidney disease   . Chronic respiratory failure with hypoxia and hypercapnia (HCC)   . COPD (chronic obstructive pulmonary disease) (HCC)   . Cor pulmonale (chronic) (HCC)   . Essential hypertension   . GERD (gastroesophageal reflux disease)   . Hyperlipidemia   . Type 2 diabetes mellitus (HCC)      Family History  Problem Relation Age of Onset  . Hypertension Father   . Heart attack Brother      Social History:  reports that he has quit smoking. His smoking use included cigarettes. He has never used smokeless tobacco. He reports that he does not drink alcohol or use drugs.   Exam: Current vital signs: BP 132/76 (BP Location: Left Arm)   Pulse (!) 103   Temp 99.6 F (37.6 C) (Axillary)   Resp 14   Ht 5\' 6"  (1.676 m)   Wt 95.8 kg   SpO2 99%   BMI 34.09 kg/m  Vital signs in last 24 hours: Temp:  [98.2 F (36.8 C)-99.6 F (37.6 C)] 99.6 F (37.6 C) (10/04 1114) Pulse Rate:  [75-105] 103 (10/04 1400) Resp:  [14-30] 14 (10/04 1400) BP: (77-138)/(49-76) 132/76 (10/04 1400) SpO2:  [90 %-100 %] 99 % (10/04 1400) FiO2 (%):  [100 %] 100 % (10/04 1400) Weight:  [95.8 kg] 95.8 kg (10/04 0514)   Physical Exam  Constitutional: Appears well-developed and well-nourished.   Psych: Unresponsive Eyes: No scleral injection HENT: ET tube in place Head: Normocephalic.  Cardiovascular: Normal rate and regular rhythm.  Respiratory: Ventilated GI: Soft.  No distension. There is no tenderness.  Skin: WDI  Neuro: Mental Status: Does not open eyes or follow commands Cranial Nerves: II: Does not blink to threat pupils are equal, round, and reactive to light.   III,IV, VI: No eye movement with doll's eye maneuver V:VII: Corneals absent bilaterally Motor: No movement to noxious stimulation in the upper extremities, minimal flexion versus triple flexion in the lower extremities Sensory: As above  Deep Tendon Reflexes: Depressed throughout  Cerebellar: Does not perform  I have reviewed labs in epic and the results pertinent to this consultation are: Elevated creatinine at 2.4  Impression: 77 year old male with early onset myoclonus post cardiac arrest.  Shortly after arrival I arranged for a stat EEG to assess for ongoing seizures which were not demonstrated. The EEG is isoelectric with the exception of epileptiform bursts associated with his peers myoclonus.  This constellation of symptoms is highly correlated with poor prognosis.  Recommendations: 1) discussions with family regarding acuity of care. 2) neurology is available as needed  This patient is critically ill and at significant risk of neurological worsening, death and care requires constant monitoring of vital signs, hemodynamics,respiratory and cardiac monitoring, neurological assessment, discussion with family, other  specialists and medical decision making of high complexity. I spent 35 minutes of neurocritical care time  in the care of  this patient.  Ritta Slot, MD [facility] [phone]  If 7pm- 7am, please page neurology on call as listed in AMION. 03/28/2018  3:44 PM

## 2018-04-22 NOTE — Progress Notes (Signed)
   12-Apr-2018 1837  Clinical Encounter Type  Visited With Patient and family together  Visit Type Initial;Spiritual support;Death  Referral From Other (Comment) (Spiritual Care Consult)  Consult/Referral To Chaplain  The chaplain responded to a request for spiritual care consult; EOL. Chaplain checked in with nurses station upon arrival. The Unit Secretary informed the Chaplain the patient had passed but family was still in the room. Chaplain approached the room with other family members. Chaplain offered her condolences to the family. The Pt. wife and family members accepted the chaplain's spiritual presence and shared the Pt. pastor was present at the time of death. The chaplain asked if there was anything else she could help with. The Pt. daughter -n-law said we are making arrangements now. It is a little harder because we are from [place].  The chaplain encouraged the Pt. family to use the RN or chaplain as support if needed.  The Pt. Family thanked the chaplain before she left.

## 2018-04-22 NOTE — Progress Notes (Signed)
Patient terminally extubated per MD order. Family and RN at bedside. RT will continue to monitor. 

## 2018-04-22 NOTE — Progress Notes (Signed)
Progress Note  Patient Name: Mario Walker Date of Encounter: 2018-04-17  Primary Cardiologist: Rozann Lesches, MD   Subjective   Asystole arrest early this AM.   Inpatient Medications    Scheduled Meds: . apixaban  5 mg Oral BID  . atorvastatin  40 mg Oral QPM  . carvedilol  25 mg Oral BID  . chlorhexidine gluconate (MEDLINE KIT)  15 mL Mouth Rinse BID  . diltiazem  180 mg Oral Daily  . DULoxetine  60 mg Oral Daily  . furosemide  40 mg Intravenous Once  . insulin aspart  0-15 Units Subcutaneous TID WC  . insulin aspart  0-5 Units Subcutaneous QHS  . insulin glargine  18 Units Subcutaneous QHS  . levalbuterol  0.63 mg Nebulization Q6H  . mouth rinse  15 mL Mouth Rinse 10 times per day  . multivitamin with minerals  1 tablet Oral Daily  . pantoprazole  80 mg Oral Daily  . torsemide  40 mg Oral BID  . vitamin C  500 mg Oral Daily   Continuous Infusions: . levETIRAcetam    . norepinephrine (LEVOPHED) Adult infusion 11 mcg/min (2018-04-17 0801)  . propofol (DIPRIVAN) infusion 5 mcg/kg/min (04/17/2018 0802)   PRN Meds: acetaminophen, albuterol, fentaNYL (SUBLIMAZE) injection, fentaNYL (SUBLIMAZE) injection, fluticasone, HYDROcodone-acetaminophen, nitroGLYCERIN, ondansetron (ZOFRAN) IV, zolpidem   Vital Signs    Vitals:   03/24/18 2102 03/24/18 2301 2018-04-17 0514 2018-04-17 0910  BP: 100/66 109/61 138/72 112/67  Pulse: 93 75 98   Resp:      Temp: 98.2 F (36.8 C)  98.3 F (36.8 C)   TempSrc: Oral  Oral   SpO2: 93%  93%   Weight:   95.8 kg   Height:        Intake/Output Summary (Last 24 hours) at 04-17-2018 0931 Last data filed at 03/24/2018 1840 Gross per 24 hour  Intake 800 ml  Output 125 ml  Net 675 ml   Filed Weights   03/23/18 0500 03/24/18 0400 17-Apr-2018 0514  Weight: 94.7 kg 93.8 kg 95.8 kg    Telemetry    SR - Personally Reviewed  ECG    na  Physical Exam   GEN: No acute distress.   Neck: No JVD Cardiac: RRR, no murmurs, rubs, or gallops.    Respiratory: Coarse bilaterally GI: Soft, nontender, non-distended  MS: No edema; No deformity. Neuro:  cannot assess, intubated sedated Psych: cannot assess, intubated sedated  Labs    Chemistry Recent Labs  Lab 03/24/18 0406 April 17, 2018 0241 Apr 17, 2018 0734  NA 137 134* 136  K 4.7 4.7 5.0  CL 95* 93* 94*  CO2 33* 33* 29  GLUCOSE 196* 130* 154*  BUN 28* 31* 33*  CREATININE 1.38* 2.10* 2.42*  CALCIUM 8.7* 8.5* 8.1*  PROT  --   --  6.4*  ALBUMIN  --   --  2.7*  AST  --   --  29  ALT  --   --  20  ALKPHOS  --   --  56  BILITOT  --   --  1.2  GFRNONAA 47* 29* 24*  GFRAA 55* 33* 28*  ANIONGAP 9 8 13      Hematology Recent Labs  Lab 03/24/18 0406 Apr 17, 2018 0241 04-17-2018 0734  WBC 11.2* 8.7 19.9*  RBC 3.67* 3.48* 3.52*  HGB 10.0* 9.4* 9.7*  HCT 33.5* 31.4* 32.4*  MCV 91.3 90.2 92.0  MCH 27.2 27.0 27.6  MCHC 29.9* 29.9* 29.9*  RDW 15.5 15.7* 15.7*  PLT  261 192 207    Cardiac Enzymes Recent Labs  Lab 03/24/18 2112 Apr 02, 2018 0241  TROPONINI 0.21* 0.21*   No results for input(s): TROPIPOC in the last 168 hours.   BNP Recent Labs  Lab 03/22/18 0423  BNP 782.0*     DDimer No results for input(s): DDIMER in the last 168 hours.   Radiology    Dg Chest Port 1 View  Result Date: 2018/04/02 CLINICAL DATA:  Post CPR. EXAM: PORTABLE CHEST 1 VIEW COMPARISON:  Radiograph 03/21/2018 FINDINGS: Endotracheal tube tip at the thoracic inlet 2.5 cm from the carina. Cardiomegaly. Bilateral diffuse airspace disease favoring pulmonary edema, grossly similar to prior. Suspected small effusions. No pneumothorax. IMPRESSION: 1. Endotracheal tube tip at the thoracic inlet. 2. Cardiomegaly, diffuse airspace disease favoring pulmonary edema, suspected pleural effusions consistent with CHF, grossly unchanged from prior exam. Electronically Signed   By: Keith Rake M.D.   On: 2018/04/02 06:53    Cardiac Studies   Patient Profile     Mario Walker a 77 y.o.malewith a hx of  suspected interstitial lung disease with chronic respiratory failure (progression to 6L/O2 the last few months), COPD, mediastinal lymphadenopathy, recently diagnosed persistent atrial fib/flutter, chronic diastolic CHF, CKD stage III, essential HTN, HLD, DM, tobacco abusewho is being seen today for the evaluation ofatrial fib RVRat the request of Dr. Grandville Silos.  Assessment & Plan    1. Afib/aflutter with RVR - has been on dilt and coreg for rate contorl, eliquis for stroke prevention - hypotension after cardiac arrest this AM. Stop dilt, stop coreg. Rates ok now, if develops tachycardia would start amiodarone drip.   2. 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 Pulmonary HTN with RV failure - - 01/2018 echo LVE 55-60%, no WMAs, abnormal diastolic function. Mild to mod RV dilatation, PASP 65, mod MR. Septum shows evidence of RV pressure overload.  - likely primarily group 3 pulm HTN given his lung disease, perhaps some component of group 2 given some evidence of diastolic HF.No strong indication for pulmonary vasodilators at this time. - can consider outpatient VQ, perhaps RHC pending f/u with primary cardiologist.   4. Elevated troponin - up to 0.63 in setting of tachycardia and severe hypoxia on admission. Probable demand ischemia. Poor cath or noninvasive candidate on admission due to AKI, afib with RVR, and chronic respiratory failure - echo with normal LVEF, no WMAs  5. Acute diastolic HF Acute diastolic HF - 08/5454 echo LVE 55-60%, no WMAs, abnormal diasotlic function indeterminate grade, evidence of RV pressure overload. Mild to mod RV dilatation, PASP 65. -IV lasix stopped  due to signifnicant uptrend in Cr/BUN. Repeat CXR at that time actually showed worsening HF and BNP had increased from 583 to 782. - Changed to torsemide 94m bid. Cr down from 2 to 1.3 with diuretic change. He subjectively reports better urine output on oral diuretic  vs IV.   - I/Os poorly documented this admission. Weights had been trending down, I doubt accuracy of 2 kg weight gain today  - hypotensive this AM after cardiac arrest early AM on levophed, hold diuretics.    6. Cardiopulmonary arrest - chest pain started around 9pm last night, rated as 6 pressure midchest to left side with some SOB. EKG afib with RBBB. Trop was checked 0.21 down from 0.63 earlier in admission. Following arrest trop flat at 0.21 then 0.22. EKGs and trop trends as well as asystole arrest argue against ischemia as  etiology. - EKG this AM SR, first degree AV block, RBBB. No new specific ST/T changes.   - early this AM PEA/asystole arrest approx 25-30 min. Tele reviewed, patient progressively became bradycardic followed by asystole.  - There are signs of possible anoxic brain injury. Hypotensive on levophed this AM.   - overall no evidence to suggest primary cardiac pathology as cause of arrest. Progressive brady to asystole arrest would suggest more possible respiratory driven arrest. He had been on eliquis, PE less likely -no indication for ischemic evaluation. We will obtain a limited echo. Biggest factor right now is his neurological status given his seizure like activity and probable anoxic brain injury - family considering plans of care. If decide to continue and central line is placed would ask for CVP and cooximetry panel.    7. AKI - occurred after arrest, Cr 1.4 up to 2.4 - with ongoing hypotension on pressors hold diuretics     For questions or updates, please contact [place] Please consult [email or web address] for contact info under        Signed, Carlyle Dolly, MD  Apr 09, 2018, 9:31 AM

## 2018-04-22 NOTE — Progress Notes (Signed)
STAT LTM started; nurse educated on event button.

## 2018-04-22 NOTE — ED Provider Notes (Signed)
[facility]  Department of Emergency Medicine   Code Blue CONSULT NOTE  Chief Complaint: Cardiac arrest/unresponsive   Level V Caveat: Unresponsive  History of present illness: I was contacted by the hospital for a CODE BLUE cardiac arrest upstairs and presented to the patient's bedside.  Called to bedside for CPR in progress.  Nursing staff patient he complained of "not feeling well" and quickly became unresponsive with PEA and asystole.  Patient hospitalized for A. fib with RVR and hypoglycemia.  Blood sugar was 95 on initial check.   ROS: Unable to obtain, Level V caveat  Scheduled Meds: . apixaban  5 mg Oral BID  . atorvastatin  40 mg Oral QPM  . carvedilol  25 mg Oral BID  . diltiazem  180 mg Oral Daily  . DULoxetine  60 mg Oral Daily  . furosemide  40 mg Intravenous Once  . insulin aspart  0-15 Units Subcutaneous TID WC  . insulin aspart  0-5 Units Subcutaneous QHS  . insulin glargine  18 Units Subcutaneous QHS  . levalbuterol  0.63 mg Nebulization TID  . multivitamin with minerals  1 tablet Oral Daily  . pantoprazole  80 mg Oral Daily  . torsemide  40 mg Oral BID  . vitamin C  500 mg Oral Daily   Continuous Infusions: . norepinephrine (LEVOPHED) Adult infusion    . propofol (DIPRIVAN) infusion     PRN Meds:.acetaminophen, albuterol, fluticasone, HYDROcodone-acetaminophen, nitroGLYCERIN, ondansetron (ZOFRAN) IV, zolpidem Past Medical History:  Diagnosis Date  . Atrial fibrillation with RVR (HCC)   . Chronic diastolic CHF (congestive heart failure) (HCC)   . Chronic kidney disease   . Chronic respiratory failure with hypoxia and hypercapnia (HCC)   . COPD (chronic obstructive pulmonary disease) (HCC)   . Cor pulmonale (chronic) (HCC)   . Essential hypertension   . GERD (gastroesophageal reflux disease)   . Hyperlipidemia   . Type 2 diabetes mellitus (HCC)    Past Surgical History:  Procedure Laterality Date  . COLON SURGERY    . ROTATOR CUFF  REPAIR     Social History   Socioeconomic History  . Marital status: Married    Spouse name: Not on file  . Number of children: Not on file  . Years of education: Not on file  . Highest education level: Not on file  Occupational History  . Not on file  Social Needs  . Financial resource strain: Not on file  . Food insecurity:    Worry: Not on file    Inability: Not on file  . Transportation needs:    Medical: Not on file    Non-medical: Not on file  Tobacco Use  . Smoking status: Former Smoker    Types: Cigarettes  . Smokeless tobacco: Never Used  Substance and Sexual Activity  . Alcohol use: No  . Drug use: No  . Sexual activity: Not on file  Lifestyle  . Physical activity:    Days per week: Not on file    Minutes per session: Not on file  . Stress: Not on file  Relationships  . Social connections:    Talks on phone: Not on file    Gets together: Not on file    Attends religious service: Not on file    Active member of club or organization: Not on file    Attends meetings of clubs or organizations: Not on file    Relationship status: Not on file  . Intimate partner violence:  Fear of current or ex partner: Not on file    Emotionally abused: Not on file    Physically abused: Not on file    Forced sexual activity: Not on file  Other Topics Concern  . Not on file  Social History Narrative  . Not on file   No Known Allergies  Last set of Vital Signs (not current) Vitals:   03/24/18 2301 2018/04/05 0514  BP: 109/61 138/72  Pulse: 75 98  Resp:    Temp:  98.3 F (36.8 C)  SpO2:  93%      Physical Exam  Gen: unresponsive Cardiovascular: pulseless  Resp: apneic. Breath sounds equal bilaterally with bagging  Abd: nondistended  Neuro: GCS 3, unresponsive to pain  HEENT: No blood in posterior pharynx, gag reflex absent  Neck: No crepitus  Musculoskeletal: +2 edema, No deformity  Skin: warm  Procedures  INTUBATION Performed by: Glynn Octave Required items: required blood products, implants, devices, and special equipment available Patient identity confirmed: provided demographic data and hospital-assigned identification number Time out: Immediately prior to procedure a "time out" was called to verify the correct patient, procedure, equipment, support staff and site/side marked as required. Indications: Respiratory failure Intubation method: Video laryngoscopy Preoxygenation: BVM Sedatives: None Paralytic: None Tube Size: 7.5 cuffed, 23 at the lip Post-procedure assessment: chest rise and ETCO2 monitor Breath sounds: equal and absent over the epigastrium Tube secured by Respiratory Therapy Patient tolerated the procedure well with no immediate complications.  CRITICAL CARE Performed by: Glynn Octave Total critical care time: 40 Critical care time was exclusive of separately billable procedures and treating other patients. Critical care was necessary to treat or prevent imminent or life-threatening deterioration. Critical care was time spent personally by me on the following activities: development of treatment plan with patient and/or surrogate as well as nursing, discussions with consultants, evaluation of patient's response to treatment, examination of patient, obtaining history from patient or surrogate, ordering and performing treatments and interventions, ordering and review of laboratory studies, ordering and review of radiographic studies, pulse oximetry and re-evaluation of patient's condition.  Cardiopulmonary Resuscitation (CPR) Procedure Note Directed/Performed by: Glynn Octave I personally directed ancillary staff and/or performed CPR in an effort to regain return of spontaneous circulation and to maintain cardiac, neuro and systemic perfusion.    Medical Decision making  PEA/asystolic arrest.  Assessment and Plan  Patient's wife at bedside reports that he is a partial code and does not want to  be intubated.  She agrees with CPR and medications.  CPR continued.  Epinephrine given per ACLS guidelines with bicarbonate.  Did have brief return of pulses after 3 cycles of CPR.   Patient was not breathing on his own.  Discussed with the wife who agrees to proceed with intubation. Pulses were lost again after 2 minutes.  CPR continued and stopped at 6:24.  Levo fed started at 10 mcg.  EKG shows ST depression in anterior leads similar to previous with rate controlled atrial fibrillation. D/w Dr. Antionette Char that posterior EKG can be considered  Patient breathing some on his own.  Blood pressure 157/74.  Discussed with wife that further CPR is unlikely to be successful as patient's neurological function is likely to be poor and she is agreeable to no escalation of care but continue everything we are doing now. Patient's son and granddaughter have arrived and are discussing future goals of care with his wife.  Dr. Antionette Char assumes care and transferring patient to ICU.    Sylas Twombly,  Jeannett Senior, MD April 24, 2018 [phone]

## 2018-04-22 DEATH — deceased

## 2019-02-22 IMAGING — CR DG CHEST 1V PORT
1 series · 1 of 1 positions shown · non-contrast
Comparison: 02/06/2018

CLINICAL DATA: Shortness of breath for 2 days

EXAM:
PORTABLE CHEST 1 VIEW

[portable]
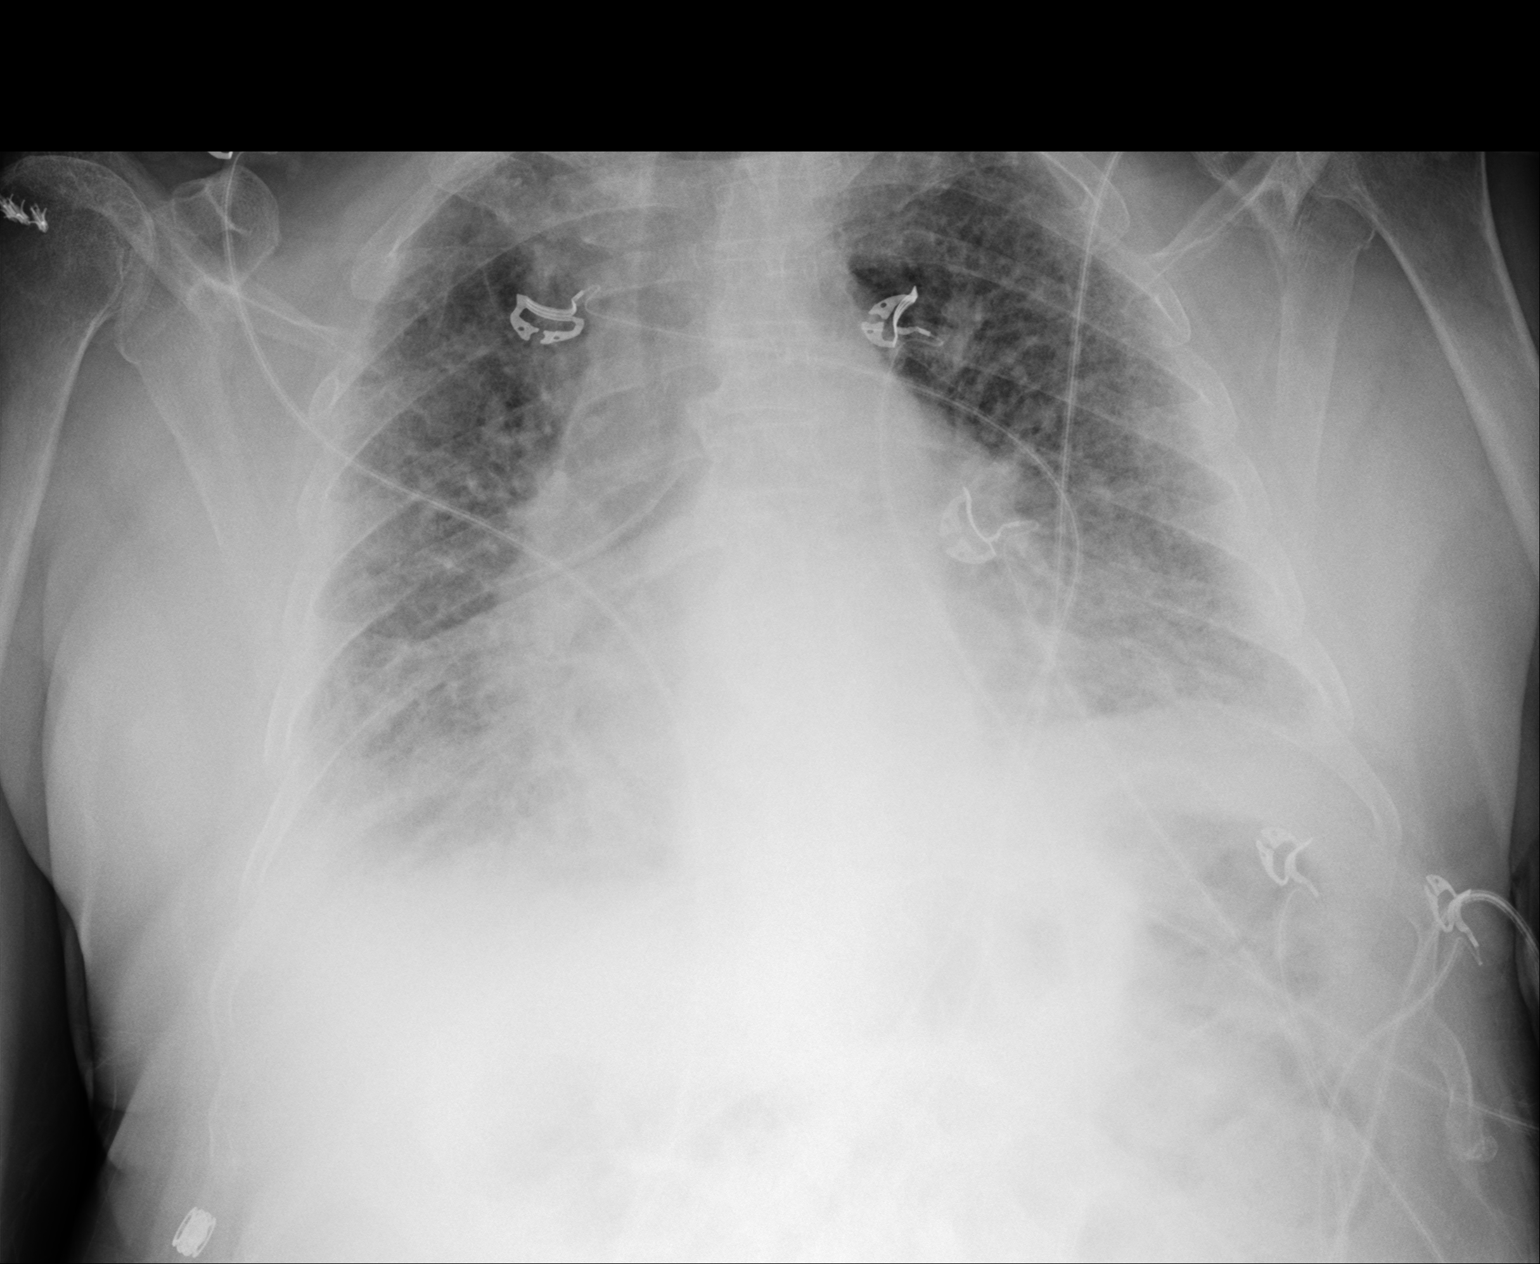

[1 of 1 positions shown; findings below may reference images not displayed]

FINDINGS: Cardiac shadow is enlarged but stable. Central vascular congestion
is again identified similar to that seen on the prior exam. No focal
confluent infiltrate is seen. Mild bibasilar atelectasis is noted.
IMPRESSION: Chronic appearing changes stable from the previous exam. Persistent
changes of vascular congestion are noted.

## 2019-03-06 IMAGING — CR DG CHEST 1V PORT
1 series · 1 of 1 positions shown · non-contrast
Comparison: Chest radiograph March 04, 2018

CLINICAL DATA: Shortness of breath for 2 weeks, tachycardia for 1
week. History of COPD and CHF.

EXAM:
PORTABLE CHEST 1 VIEW

[portable]
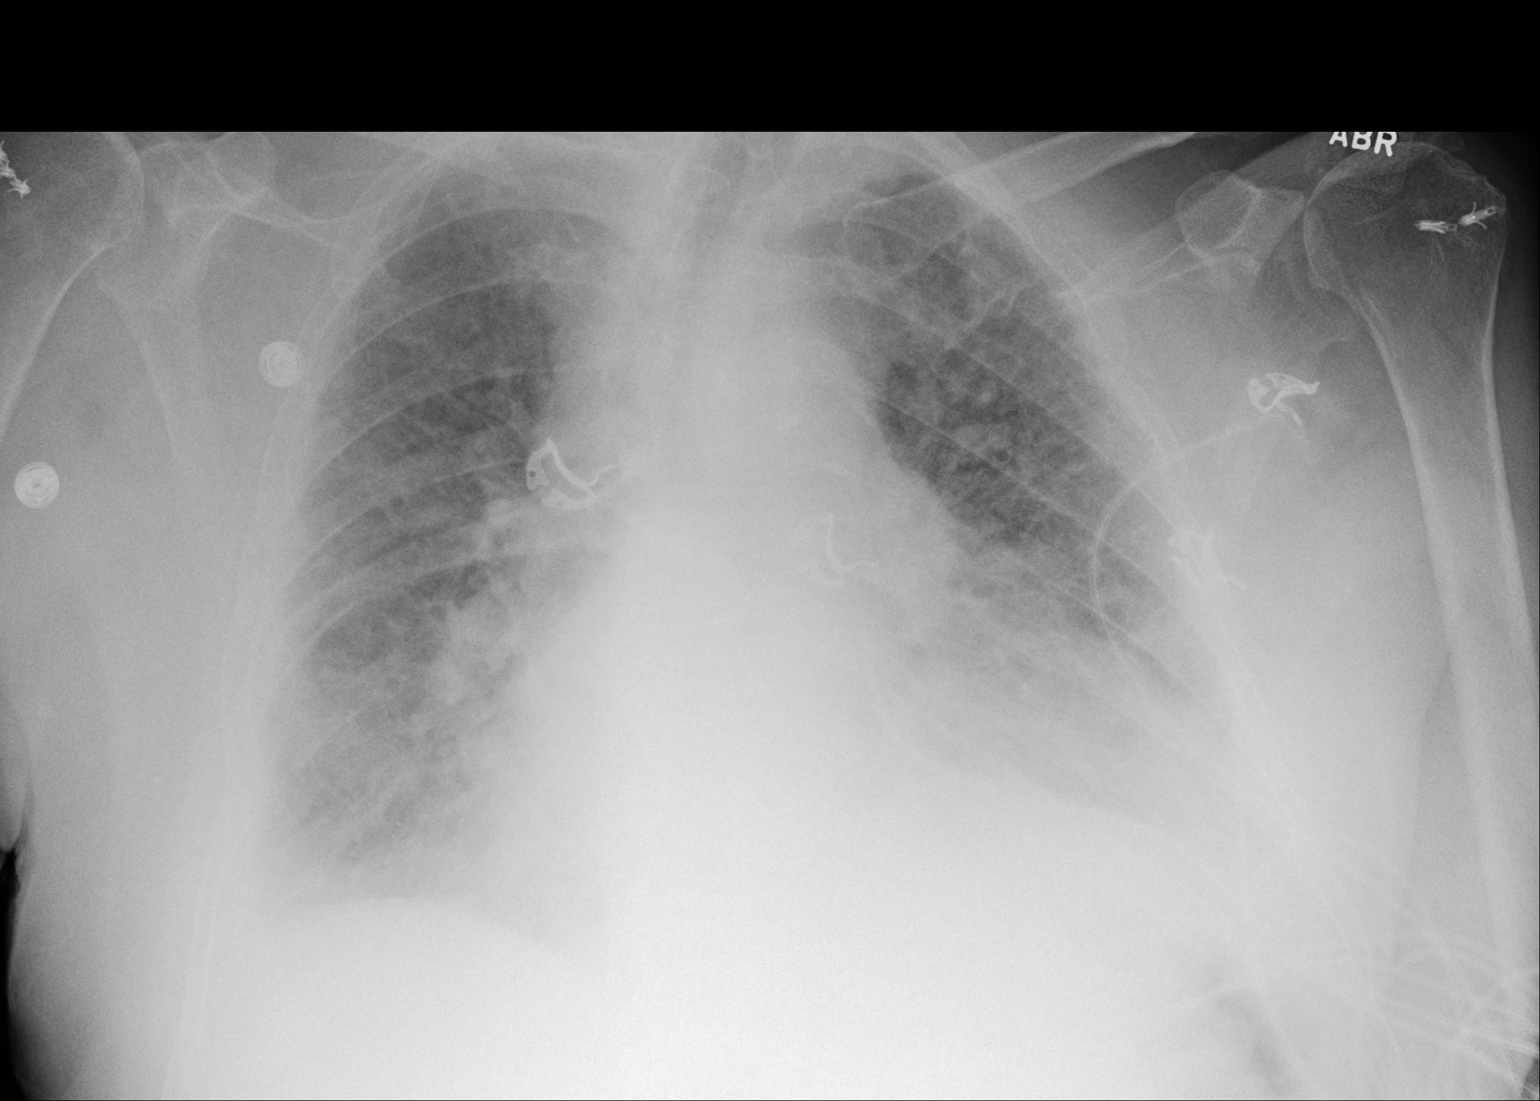

[1 of 1 positions shown; findings below may reference images not displayed]

FINDINGS: Stable at least moderate cardiomegaly, partially obscured by diffuse
interstitial prominence, alveolar airspace opacities. Small pleural
effusions. Mediastinal silhouette is not suspicious, calcified
aortic arch. Fullness of the pulmonary hila unchanged. No
pneumothorax. Suture anchors bilateral humeral heads.
IMPRESSION: Stable cardiomegaly. Diffuse interstitial prominence, potentially
chronic. Similar bibasilar airspace opacities and small pleural
effusions.

Aortic Atherosclerosis ([id number]).

## 2019-03-08 IMAGING — CR DG CHEST 1V PORT
1 series · 1 of 1 positions shown · non-contrast
Comparison: 03/16/2018.

CLINICAL DATA: Shortness of breath.

EXAM:
PORTABLE CHEST 1 VIEW

[portable]
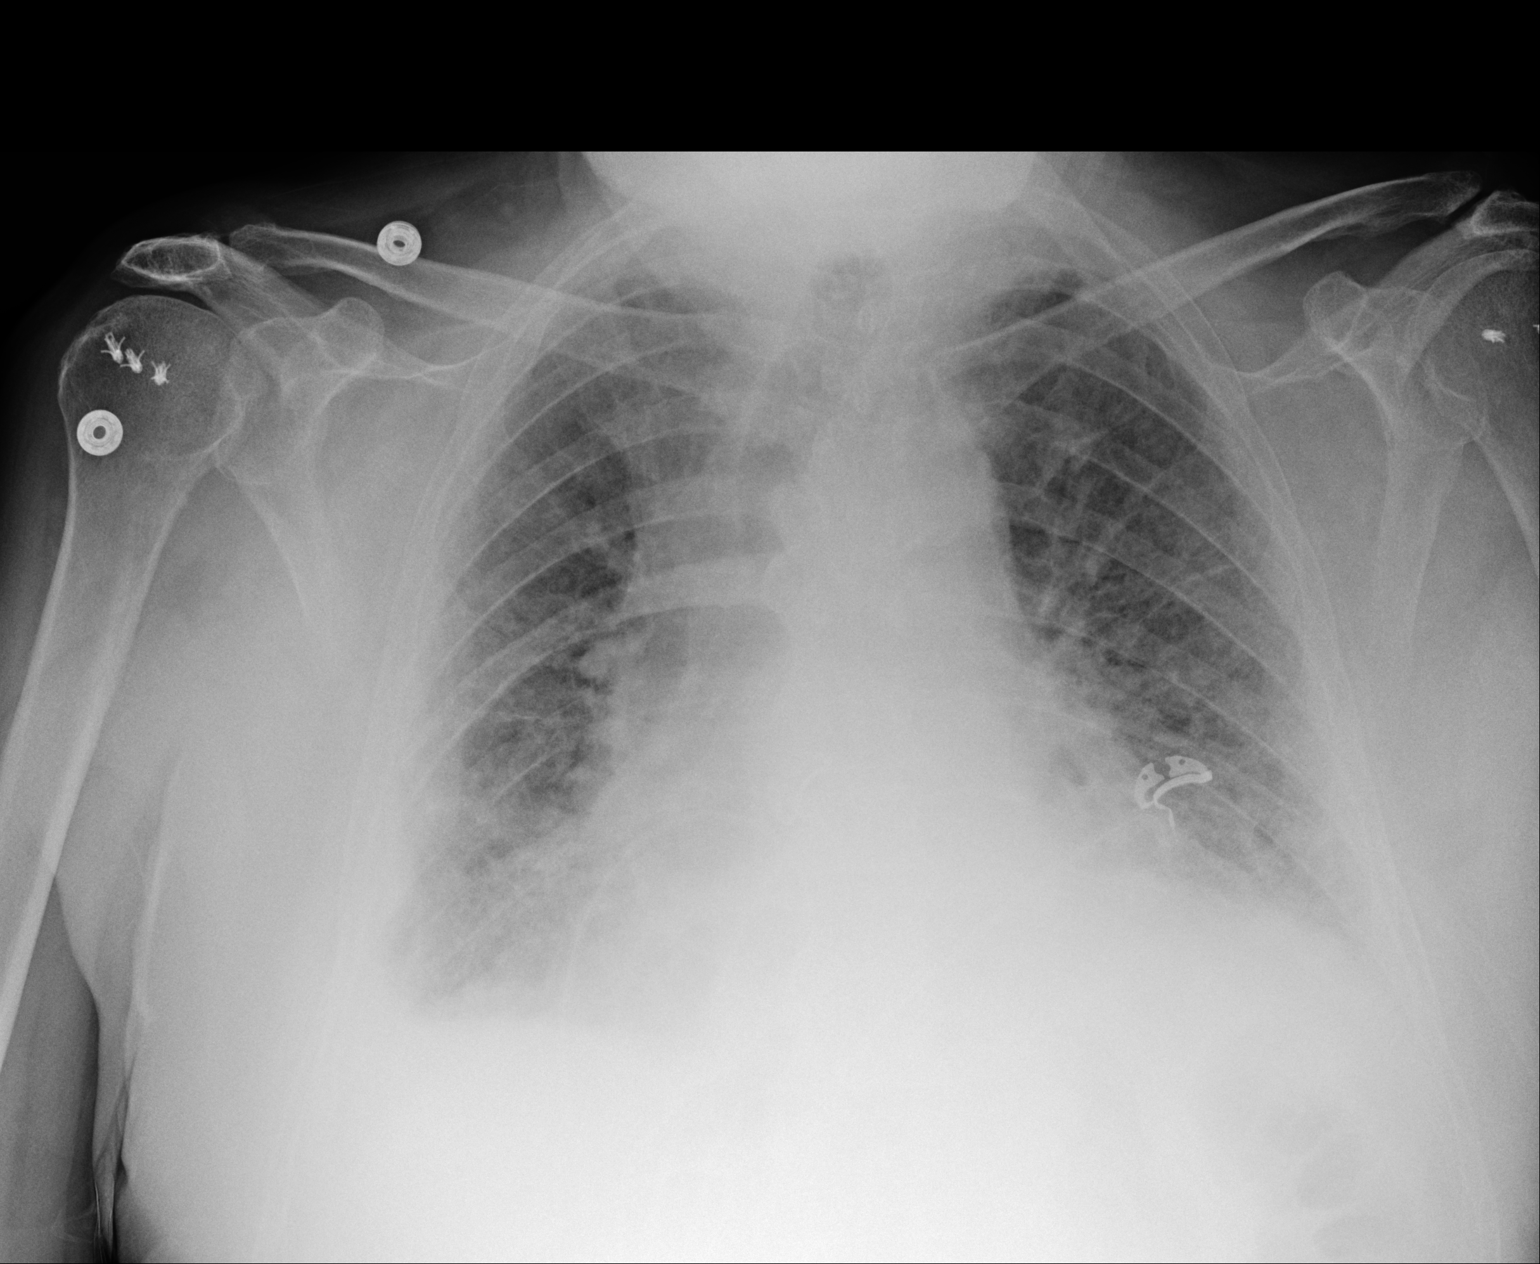

[1 of 1 positions shown; findings below may reference images not displayed]

FINDINGS: Cardiomegaly with diffuse severe bilateral pulmonary interstitial
prominence and bilateral pleural effusions. Findings consistent with
CHF. No pneumothorax.
IMPRESSION: Congestive heart failure with prominent bilateral pulmonary
interstitial edema and bilateral pleural effusions.
